# Patient Record
Sex: Female | Born: 1960 | Race: White | Hispanic: No | Marital: Married | Smoking: Never smoker
Health system: Southern US, Community
[De-identification: ages and names within clinical notes are randomized; demographics above are authoritative.]

## PROBLEM LIST (undated history)

## (undated) DIAGNOSIS — F32A Depression, unspecified: Secondary | ICD-10-CM

## (undated) DIAGNOSIS — I499 Cardiac arrhythmia, unspecified: Secondary | ICD-10-CM

## (undated) DIAGNOSIS — K5090 Crohn's disease, unspecified, without complications: Secondary | ICD-10-CM

## (undated) DIAGNOSIS — F329 Major depressive disorder, single episode, unspecified: Secondary | ICD-10-CM

## (undated) DIAGNOSIS — K56609 Unspecified intestinal obstruction, unspecified as to partial versus complete obstruction: Secondary | ICD-10-CM

## (undated) DIAGNOSIS — K259 Gastric ulcer, unspecified as acute or chronic, without hemorrhage or perforation: Secondary | ICD-10-CM

## (undated) DIAGNOSIS — T4145XA Adverse effect of unspecified anesthetic, initial encounter: Secondary | ICD-10-CM

## (undated) DIAGNOSIS — R112 Nausea with vomiting, unspecified: Secondary | ICD-10-CM

## (undated) DIAGNOSIS — Z9889 Other specified postprocedural states: Secondary | ICD-10-CM

## (undated) DIAGNOSIS — K922 Gastrointestinal hemorrhage, unspecified: Secondary | ICD-10-CM

## (undated) DIAGNOSIS — K449 Diaphragmatic hernia without obstruction or gangrene: Secondary | ICD-10-CM

## (undated) DIAGNOSIS — N6310 Unspecified lump in the right breast, unspecified quadrant: Secondary | ICD-10-CM

## (undated) DIAGNOSIS — D509 Iron deficiency anemia, unspecified: Secondary | ICD-10-CM

## (undated) DIAGNOSIS — F419 Anxiety disorder, unspecified: Secondary | ICD-10-CM

## (undated) DIAGNOSIS — T8859XA Other complications of anesthesia, initial encounter: Secondary | ICD-10-CM

## (undated) HISTORY — DX: Unspecified intestinal obstruction, unspecified as to partial versus complete obstruction: K56.609

## (undated) HISTORY — PX: TONSILLECTOMY: SUR1361

## (undated) HISTORY — DX: Crohn's disease, unspecified, without complications: K50.90

## (undated) HISTORY — DX: Diaphragmatic hernia without obstruction or gangrene: K44.9

## (undated) HISTORY — PX: APPENDECTOMY: SHX54

## (undated) HISTORY — DX: Gastrointestinal hemorrhage, unspecified: K92.2

## (undated) HISTORY — DX: Iron deficiency anemia, unspecified: D50.9

## (undated) HISTORY — PX: BREAST SURGERY: SHX581

## (undated) HISTORY — PX: HERNIA REPAIR: SHX51

---
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
## 2000-02-15 ENCOUNTER — Other Ambulatory Visit: Admission: RE | Admit: 2000-02-15 | Discharge: 2000-02-15 | Payer: Self-pay | Admitting: Gynecology

## 2000-04-03 ENCOUNTER — Encounter: Admission: RE | Admit: 2000-04-03 | Discharge: 2000-04-03 | Payer: Self-pay | Admitting: Gynecology

## 2000-04-03 ENCOUNTER — Encounter: Payer: Self-pay | Admitting: Gynecology

## 2000-08-10 ENCOUNTER — Encounter (INDEPENDENT_AMBULATORY_CARE_PROVIDER_SITE_OTHER): Payer: Self-pay

## 2000-08-10 ENCOUNTER — Other Ambulatory Visit: Admission: RE | Admit: 2000-08-10 | Discharge: 2000-08-10 | Payer: Self-pay | Admitting: Plastic Surgery

## 2000-10-27 ENCOUNTER — Encounter (INDEPENDENT_AMBULATORY_CARE_PROVIDER_SITE_OTHER): Payer: Self-pay | Admitting: *Deleted

## 2000-10-27 ENCOUNTER — Other Ambulatory Visit: Admission: RE | Admit: 2000-10-27 | Discharge: 2000-10-27 | Payer: Self-pay | Admitting: Gynecology

## 2000-10-27 ENCOUNTER — Encounter: Payer: Self-pay | Admitting: Gynecology

## 2000-10-27 ENCOUNTER — Encounter: Admission: RE | Admit: 2000-10-27 | Discharge: 2000-10-27 | Payer: Self-pay | Admitting: Gynecology

## 2001-03-20 ENCOUNTER — Other Ambulatory Visit: Admission: RE | Admit: 2001-03-20 | Discharge: 2001-03-20 | Payer: Self-pay | Admitting: Gynecology

## 2001-10-29 ENCOUNTER — Encounter: Payer: Self-pay | Admitting: Gynecology

## 2001-10-29 ENCOUNTER — Encounter: Admission: RE | Admit: 2001-10-29 | Discharge: 2001-10-29 | Payer: Self-pay | Admitting: Gynecology

## 2001-12-24 ENCOUNTER — Encounter: Admission: RE | Admit: 2001-12-24 | Discharge: 2001-12-24 | Payer: Self-pay | Admitting: Internal Medicine

## 2001-12-24 ENCOUNTER — Encounter: Payer: Self-pay | Admitting: Internal Medicine

## 2002-04-02 ENCOUNTER — Other Ambulatory Visit: Admission: RE | Admit: 2002-04-02 | Discharge: 2002-04-02 | Payer: Self-pay | Admitting: Gynecology

## 2002-11-13 ENCOUNTER — Encounter: Payer: Self-pay | Admitting: Gynecology

## 2002-11-13 ENCOUNTER — Encounter: Admission: RE | Admit: 2002-11-13 | Discharge: 2002-11-13 | Payer: Self-pay | Admitting: Gynecology

## 2002-11-20 ENCOUNTER — Encounter (INDEPENDENT_AMBULATORY_CARE_PROVIDER_SITE_OTHER): Payer: Self-pay | Admitting: Specialist

## 2002-11-20 ENCOUNTER — Ambulatory Visit (HOSPITAL_COMMUNITY): Admission: RE | Admit: 2002-11-20 | Discharge: 2002-11-20 | Payer: Self-pay | Admitting: Gastroenterology

## 2003-04-09 ENCOUNTER — Other Ambulatory Visit: Admission: RE | Admit: 2003-04-09 | Discharge: 2003-04-09 | Payer: Self-pay | Admitting: Gynecology

## 2003-11-19 ENCOUNTER — Encounter: Admission: RE | Admit: 2003-11-19 | Discharge: 2003-11-19 | Payer: Self-pay | Admitting: Gynecology

## 2004-04-21 ENCOUNTER — Other Ambulatory Visit: Admission: RE | Admit: 2004-04-21 | Discharge: 2004-04-21 | Payer: Self-pay | Admitting: Gynecology

## 2004-12-08 ENCOUNTER — Encounter: Admission: RE | Admit: 2004-12-08 | Discharge: 2004-12-08 | Payer: Self-pay | Admitting: Gynecology

## 2005-02-10 ENCOUNTER — Ambulatory Visit: Payer: Self-pay | Admitting: Internal Medicine

## 2005-04-28 ENCOUNTER — Other Ambulatory Visit: Admission: RE | Admit: 2005-04-28 | Discharge: 2005-04-28 | Payer: Self-pay | Admitting: Gynecology

## 2005-12-30 ENCOUNTER — Encounter: Admission: RE | Admit: 2005-12-30 | Discharge: 2005-12-30 | Payer: Self-pay | Admitting: Gynecology

## 2006-04-25 ENCOUNTER — Ambulatory Visit: Payer: Self-pay | Admitting: Internal Medicine

## 2006-05-31 ENCOUNTER — Other Ambulatory Visit: Admission: RE | Admit: 2006-05-31 | Discharge: 2006-05-31 | Payer: Self-pay | Admitting: Gynecology

## 2006-06-25 ENCOUNTER — Encounter: Admission: RE | Admit: 2006-06-25 | Discharge: 2006-06-25 | Payer: Self-pay | Admitting: Family Medicine

## 2007-01-30 ENCOUNTER — Encounter: Admission: RE | Admit: 2007-01-30 | Discharge: 2007-01-30 | Payer: Self-pay | Admitting: Gynecology

## 2008-03-31 ENCOUNTER — Encounter: Admission: RE | Admit: 2008-03-31 | Discharge: 2008-03-31 | Payer: Self-pay | Admitting: Gynecology

## 2008-04-17 ENCOUNTER — Encounter: Admission: RE | Admit: 2008-04-17 | Discharge: 2008-04-17 | Payer: Self-pay | Admitting: Gynecology

## 2009-04-20 ENCOUNTER — Encounter: Admission: RE | Admit: 2009-04-20 | Discharge: 2009-04-20 | Payer: Self-pay | Admitting: Gynecology

## 2009-08-21 ENCOUNTER — Telehealth: Payer: Self-pay | Admitting: Internal Medicine

## 2009-10-06 DIAGNOSIS — Z8679 Personal history of other diseases of the circulatory system: Secondary | ICD-10-CM | POA: Insufficient documentation

## 2009-10-06 DIAGNOSIS — F411 Generalized anxiety disorder: Secondary | ICD-10-CM | POA: Insufficient documentation

## 2009-10-06 DIAGNOSIS — R0602 Shortness of breath: Secondary | ICD-10-CM | POA: Insufficient documentation

## 2009-10-07 ENCOUNTER — Ambulatory Visit: Payer: Self-pay | Admitting: Internal Medicine

## 2010-01-14 ENCOUNTER — Ambulatory Visit (HOSPITAL_COMMUNITY): Admission: RE | Admit: 2010-01-14 | Discharge: 2010-01-14 | Payer: Self-pay | Admitting: Gastroenterology

## 2010-08-03 ENCOUNTER — Encounter: Admission: RE | Admit: 2010-08-03 | Discharge: 2010-08-03 | Payer: Self-pay | Admitting: Gynecology

## 2011-01-02 ENCOUNTER — Encounter: Payer: Self-pay | Admitting: Gynecology

## 2011-04-29 NOTE — Op Note (Signed)
NAME:  Carolyn Chaney, Carolyn Chaney                          ACCOUNT NO.:  [id number]   MEDICAL RECORD NO.:  [id number]                   PATIENT TYPE:  AMB   LOCATION:  ENDO                                 FACILITY:  [facility]   PHYSICIAN:  Earle Gell, M.D.                DATE OF BIRTH:  04/15/1961   DATE OF PROCEDURE:  11/20/2002  DATE OF DISCHARGE:                                 OPERATIVE REPORT   PROCEDURE:  Screening colonoscopy.   ENDOSCOPIST:  Mickeal Skinner, M.D.   REFERRING PHYSICIAN:  Berneta Sages, M.D.   INDICATIONS FOR PROCEDURE:  The patient is a 49 year old female born January 16, 1961.  She is undergoing diagnostic colonoscopy to evaluate guaiac-  positive stool.  Her hemoglobin is 11.2 g.  Her serum ferritin and iron  saturation are normal.   PREMEDICATION:  Versed 12 mg, Demerol 75 mg.   ENDOSCOPE:  Olympus pediatric colonoscope.   DESCRIPTION OF PROCEDURE:  After obtaining the informed consent, the patient  was placed in the left lateral decubitus position.  I administered  intravenous Demerol and intravenous Versed to achieve conscious sedation  before the  procedure.  The patient's blood pressure, oxygen saturation, and cardiac  rhythm were monitored throughout the procedure and documented in the medical  record.   Anal inspection was normal.  Digital rectal exam was normal.  The Olympus  pediatric video colonoscope was introduced into the rectum and advanced to  the cecum.  The ileocecal valve oris was identified but was unable to  intubate the distal ileum.  Colonic preparation for the exam today was  excellent.   Rectum normal.   Sigmoid colon and descending colon:  Normal.   Splenic flexure normal.   Transverse colon normal.   Hepatic flexure normal.   Ascending colon normal.   Cecum and ileocecal valve:  There are a few aphthous type erosions in the  cecum.  The cecal mucosa is not friable.  Peering into the distal ileum  through the ileocecal  valve orifice revealed normal distal ileal mucosa.  Biopsies were taken from the cecum.  The patient does take nonsteroidal anti-  inflammatory medication on a daily basis to treat migraine headaches and  this may reflect NSAID injury to the cecal mucosa.   ASSESSMENT:  Normal proctocolonoscopy to the cecum except for a few  scattered erosions in the cecum which were biopsied and may simply reflect  nonsteroidal anti-inflammatory drug injury to the cecal mucosa.  There are  no definite signs for the presence of inflammatory bowel disease, and I  identify no colorectal neoplasia.                                                Earle Gell, M.D.  MJ/MEDQ  D:  11/20/2002  T:  11/20/2002  Job:  [id number]   cc:   Berneta Sages, M.D.  [address]  [place]  [place] [address]  Fax: [phone]

## 2011-07-20 ENCOUNTER — Other Ambulatory Visit: Payer: Self-pay | Admitting: Gynecology

## 2011-07-20 DIAGNOSIS — N6009 Solitary cyst of unspecified breast: Secondary | ICD-10-CM

## 2011-08-16 ENCOUNTER — Other Ambulatory Visit: Payer: Self-pay

## 2011-08-25 ENCOUNTER — Other Ambulatory Visit: Payer: Self-pay

## 2011-09-01 ENCOUNTER — Ambulatory Visit
Admission: RE | Admit: 2011-09-01 | Discharge: 2011-09-01 | Disposition: A | Discharge status: Home / Self Care | Payer: BC Managed Care – PPO | Source: Ambulatory Visit | Attending: Gynecology | Admitting: Gynecology

## 2011-09-01 DIAGNOSIS — N6009 Solitary cyst of unspecified breast: Secondary | ICD-10-CM

## 2011-09-16 ENCOUNTER — Other Ambulatory Visit: Payer: Self-pay | Admitting: Hematology & Oncology

## 2011-09-16 ENCOUNTER — Ambulatory Visit (HOSPITAL_BASED_OUTPATIENT_CLINIC_OR_DEPARTMENT_OTHER): Payer: BC Managed Care – PPO | Admitting: Hematology & Oncology

## 2011-09-16 ENCOUNTER — Other Ambulatory Visit: Payer: Self-pay | Admitting: Family

## 2011-09-16 DIAGNOSIS — D649 Anemia, unspecified: Secondary | ICD-10-CM

## 2011-09-16 DIAGNOSIS — D509 Iron deficiency anemia, unspecified: Secondary | ICD-10-CM

## 2011-09-16 LAB — CBC WITH DIFFERENTIAL (CANCER CENTER ONLY)
BASO#: 0.1 10*3/uL (ref 0.0–0.2)
BASO%: 0.9 % (ref 0.0–2.0)
EOS%: 1.8 % (ref 0.0–7.0)
HCT: 24.2 % — ABNORMAL LOW (ref 34.8–46.6)
HGB: 7.2 g/dL — ABNORMAL LOW (ref 11.6–15.9)
LYMPH#: 1.2 10*3/uL (ref 0.9–3.3)
LYMPH%: 22.3 % (ref 14.0–48.0)
MCH: 23.7 pg — ABNORMAL LOW (ref 26.0–34.0)
MCHC: 29.8 g/dL — ABNORMAL LOW (ref 32.0–36.0)
MCV: 80 fL — ABNORMAL LOW (ref 81–101)
MONO%: 6.2 % (ref 0.0–13.0)
NEUT#: 3.8 10*3/uL (ref 1.5–6.5)
Platelets: 291 10*3/uL (ref 145–400)
RBC: 3.04 10*6/uL — ABNORMAL LOW (ref 3.70–5.32)
RDW: 16.4 % — ABNORMAL HIGH (ref 11.1–15.7)

## 2011-09-16 LAB — BASIC METABOLIC PANEL
CO2: 22 mEq/L (ref 19–32)
Chloride: 103 mEq/L (ref 96–112)
Creatinine, Ser: 0.83 mg/dL (ref 0.50–1.10)
Potassium: 4.7 mEq/L (ref 3.5–5.3)
Sodium: 138 mEq/L (ref 135–145)

## 2011-09-16 LAB — HOLD TUBE, BLOOD BANK - CHCC SATELLITE

## 2011-09-16 LAB — CHCC SATELLITE - SMEAR

## 2011-09-16 LAB — FERRITIN: Ferritin: 6 ng/mL — ABNORMAL LOW (ref 10–291)

## 2011-09-19 LAB — RETICULOCYTES (CHCC)
ABS Retic: 104.6 10*3/uL (ref 19.0–186.0)
RBC.: 3.17 MIL/uL — ABNORMAL LOW (ref 3.87–5.11)

## 2011-09-19 LAB — LACTATE DEHYDROGENASE: LDH: 78 U/L — ABNORMAL LOW (ref 94–250)

## 2011-09-19 LAB — IRON AND TIBC
TIBC: 425 ug/dL (ref 250–470)
UIBC: 290 ug/dL (ref 125–400)

## 2011-09-19 LAB — VITAMIN D 25 HYDROXY (VIT D DEFICIENCY, FRACTURES): Vit D, 25-Hydroxy: 47 ng/mL (ref 30–89)

## 2011-09-22 ENCOUNTER — Other Ambulatory Visit: Payer: Self-pay | Admitting: Gastroenterology

## 2011-09-22 DIAGNOSIS — K922 Gastrointestinal hemorrhage, unspecified: Secondary | ICD-10-CM

## 2011-09-23 ENCOUNTER — Ambulatory Visit
Admission: RE | Admit: 2011-09-23 | Discharge: 2011-09-23 | Disposition: A | Discharge status: Home / Self Care | Payer: BC Managed Care – PPO | Source: Ambulatory Visit | Attending: Gastroenterology | Admitting: Gastroenterology

## 2011-09-23 DIAGNOSIS — K922 Gastrointestinal hemorrhage, unspecified: Secondary | ICD-10-CM

## 2011-09-23 MED ORDER — IOHEXOL 300 MG/ML  SOLN
100.0000 mL | Freq: Once | INTRAMUSCULAR | Status: AC | PRN
  Administered 2011-09-23: 100 mL via INTRAVENOUS

## 2011-10-04 ENCOUNTER — Encounter: Payer: Self-pay | Admitting: *Deleted

## 2011-10-27 ENCOUNTER — Ambulatory Visit (HOSPITAL_BASED_OUTPATIENT_CLINIC_OR_DEPARTMENT_OTHER): Payer: BC Managed Care – PPO | Admitting: Hematology & Oncology

## 2011-10-27 ENCOUNTER — Other Ambulatory Visit: Payer: Self-pay | Admitting: Hematology & Oncology

## 2011-10-27 ENCOUNTER — Encounter: Payer: Self-pay | Admitting: *Deleted

## 2011-10-27 ENCOUNTER — Other Ambulatory Visit (HOSPITAL_BASED_OUTPATIENT_CLINIC_OR_DEPARTMENT_OTHER): Payer: BC Managed Care – PPO | Admitting: Lab

## 2011-10-27 VITALS — BP 107/60 | HR 63 | Temp 97.5°F | Ht 66.0 in | Wt 141.0 lb

## 2011-10-27 DIAGNOSIS — K259 Gastric ulcer, unspecified as acute or chronic, without hemorrhage or perforation: Secondary | ICD-10-CM

## 2011-10-27 DIAGNOSIS — D500 Iron deficiency anemia secondary to blood loss (chronic): Secondary | ICD-10-CM

## 2011-10-27 LAB — IRON AND TIBC
Iron: 89 ug/dL (ref 42–145)
TIBC: 282 ug/dL (ref 250–470)
TIBC: 282 ug/dL (ref 250–470)
UIBC: 193 ug/dL (ref 125–400)
UIBC: 193 ug/dL (ref 125–400)

## 2011-10-27 LAB — RETICULOCYTES (CHCC)
ABS Retic: 25.3 10*3/uL (ref 19.0–186.0)
RBC.: 4.22 MIL/uL (ref 3.87–5.11)
RBC.: 4.22 MIL/uL (ref 3.87–5.11)
RBC.: 4.22 MIL/uL (ref 3.87–5.11)
Retic Ct Pct: 0.6 % (ref 0.4–2.3)
Retic Ct Pct: 0.6 % (ref 0.4–2.3)
Retic Ct Pct: 0.6 % (ref 0.4–2.3)

## 2011-10-27 LAB — CBC WITH DIFFERENTIAL (CANCER CENTER ONLY)
BASO#: 0 10*3/uL (ref 0.0–0.2)
HCT: 37 % (ref 34.8–46.6)
HGB: 12.3 g/dL (ref 11.6–15.9)
LYMPH#: 1.2 10*3/uL (ref 0.9–3.3)
MCV: 88 fL (ref 81–101)
MONO#: 0.3 10*3/uL (ref 0.1–0.9)
NEUT%: 67.8 % (ref 39.6–80.0)
WBC: 5.1 10*3/uL (ref 3.9–10.0)

## 2011-10-27 NOTE — Progress Notes (Signed)
CSN: [id number] This office note has been dictated. ENNEVER,PETER R

## 2011-10-27 NOTE — Progress Notes (Signed)
CC:   Carolyn Chaney. Carolyn Chaney, M.D. Carolyn Chaney, M.D. Carolyn Chaney, M.D.  DIAGNOSES: 1. Iron deficiency anemia secondary to GI bleeding. 2. Intestinal gastric ulcers.  CURRENT THERAPY:  The patient is status post IV iron with Feraheme.  INTERIM HISTORY:  Carolyn Chaney comes in for her 2nd visit.  She is feeling a whole lot better.  When we saw her back in October, her ferritin was only 6.  We gave her dose of Feraheme at 1020 mg.  She began to feel better.  She has not noted any melena or bright red blood per rectum.  She does have some abdominal discomfort.  She was wondering if she needed to have another GI evaluation.  She has seen Dr. Earle Chaney before.  I told her that I am sure Dr. Wynetta Emery would be more than happy to see her.  She is having some back discomfort.  Again, she is not taking nonsteroidals now.  She was taking a lot of Goody's headache powder.  I told her that she could probably try some Ultram if she wished.  She has a very strong history of heart disease in the family.  She is off aspirin.  I told her that she could probably take a baby coated aspirin which should be okay.  PHYSICAL EXAMINATION:  General:  On her physical exam, this is a well- developed, well-nourished, white female in no obvious distress. Vital signs: Show a temperature of 97.5, pulse 63, respiratory rate 20, blood pressure 107/90, and weight is 141.  Head and neck exam shows normocephalic and atraumatic skull.  There are no ocular or oral lesions.  There are no palpable cervical or supraclavicular lymph nodes. Lungs are Clear to percussion and auscultation bilaterally.  Cardiac exam: Regular rate and rhythm with a normal S1 and S2. No gallops, murmurs, rubs, or bruits.  Abdominal exam: Soft with good bowel sounds. There is no palpable abdominal mass.  There is no palpable hepatosplenomegaly.  Back exam:  There is no over the spine, ribs, or hips.  Extremities show No clubbing, cyanosis,  or edema.  Neurological exam shows no focal neurological deficit.  LABORATORY STUDIES:  White cell count 12.1, hemoglobin 12.3, hematocrit 37, and  platelet count 283. MCV is 88.  IMPRESSION:  Carolyn Chaney is a 50 year old white female with iron deficiency anemia.  This is improved quite nicely.  We will check her iron stores.  We will what her ferritin is.  I really want to try to get her ferritin above 100 so that she can maintain her iron levels.  We will go ahead and plan to get her back next year.  I think that that would be appropriate for [place].  November 14, 2012Again, we will follow her iron studies.    ______________________________ [name], M.D. PRE/MEDQ  D:  10/27/2011  T:  10/27/2011  Job:  [id number]  ADDENDUM:  Ferritin is 43.  Needs 1 more dose of FeraHeme.

## 2011-10-27 NOTE — Progress Notes (Signed)
Sent schedule to pt for january

## 2011-11-07 ENCOUNTER — Ambulatory Visit (HOSPITAL_BASED_OUTPATIENT_CLINIC_OR_DEPARTMENT_OTHER): Payer: BC Managed Care – PPO

## 2011-11-07 ENCOUNTER — Encounter: Payer: Self-pay | Admitting: Family

## 2011-11-07 ENCOUNTER — Other Ambulatory Visit: Payer: Self-pay | Admitting: Family

## 2011-11-07 VITALS — BP 105/70 | HR 57 | Temp 97.2°F

## 2011-11-07 DIAGNOSIS — D509 Iron deficiency anemia, unspecified: Secondary | ICD-10-CM

## 2011-11-07 DIAGNOSIS — D500 Iron deficiency anemia secondary to blood loss (chronic): Secondary | ICD-10-CM

## 2011-11-07 DIAGNOSIS — K259 Gastric ulcer, unspecified as acute or chronic, without hemorrhage or perforation: Secondary | ICD-10-CM

## 2011-11-07 HISTORY — DX: Iron deficiency anemia, unspecified: D50.9

## 2011-11-07 MED ORDER — SODIUM CHLORIDE 0.9 % IV SOLN
Freq: Once | INTRAVENOUS | Status: AC
  Administered 2011-11-07: 15:00:00 via INTRAVENOUS

## 2011-11-07 MED ORDER — SODIUM CHLORIDE 0.9 % IV SOLN
1020.0000 mg | Freq: Once | INTRAVENOUS | Status: AC
  Administered 2011-11-07: 1020 mg via INTRAVENOUS
  Filled 2011-11-07: qty 34

## 2011-12-15 ENCOUNTER — Other Ambulatory Visit: Payer: Self-pay | Admitting: Gynecology

## 2012-01-02 ENCOUNTER — Other Ambulatory Visit: Payer: BC Managed Care – PPO | Admitting: Lab

## 2012-01-02 ENCOUNTER — Telehealth: Payer: Self-pay | Admitting: Hematology & Oncology

## 2012-01-02 ENCOUNTER — Ambulatory Visit: Payer: BC Managed Care – PPO | Admitting: Hematology & Oncology

## 2012-01-02 NOTE — Telephone Encounter (Signed)
Pt called moved 1-21 to 2-20 said she had cancelled , but we don't have record of it.

## 2012-02-01 ENCOUNTER — Other Ambulatory Visit (HOSPITAL_BASED_OUTPATIENT_CLINIC_OR_DEPARTMENT_OTHER): Payer: BC Managed Care – PPO | Admitting: Lab

## 2012-02-01 ENCOUNTER — Telehealth: Payer: Self-pay | Admitting: Hematology & Oncology

## 2012-02-01 ENCOUNTER — Ambulatory Visit (HOSPITAL_BASED_OUTPATIENT_CLINIC_OR_DEPARTMENT_OTHER): Payer: BC Managed Care – PPO | Admitting: Hematology & Oncology

## 2012-02-01 VITALS — BP 106/70 | HR 67 | Temp 97.0°F | Ht 66.0 in | Wt 141.0 lb

## 2012-02-01 DIAGNOSIS — M5431 Sciatica, right side: Secondary | ICD-10-CM

## 2012-02-01 DIAGNOSIS — M549 Dorsalgia, unspecified: Secondary | ICD-10-CM

## 2012-02-01 DIAGNOSIS — D509 Iron deficiency anemia, unspecified: Secondary | ICD-10-CM

## 2012-02-01 DIAGNOSIS — R109 Unspecified abdominal pain: Secondary | ICD-10-CM

## 2012-02-01 DIAGNOSIS — D500 Iron deficiency anemia secondary to blood loss (chronic): Secondary | ICD-10-CM

## 2012-02-01 LAB — CBC WITH DIFFERENTIAL (CANCER CENTER ONLY)
BASO%: 0.5 % (ref 0.0–2.0)
EOS%: 2.2 % (ref 0.0–7.0)
LYMPH%: 23.6 % (ref 14.0–48.0)
MCH: 31.6 pg (ref 26.0–34.0)
MCV: 94 fL (ref 81–101)
MONO#: 0.3 10*3/uL (ref 0.1–0.9)
MONO%: 5.1 % (ref 0.0–13.0)
Platelets: 227 10*3/uL (ref 145–400)
RDW: 12.4 % (ref 11.1–15.7)
WBC: 5.5 10*3/uL (ref 3.9–10.0)

## 2012-02-01 LAB — CHCC SATELLITE - SMEAR

## 2012-02-01 NOTE — Telephone Encounter (Signed)
Mailed 04-18-12 schedule

## 2012-02-01 NOTE — Progress Notes (Signed)
This office note has been dictated.

## 2012-02-01 NOTE — Progress Notes (Signed)
Addended by: Burney Gauze R on: 02/01/2012 01:10 PM   Modules accepted: Orders

## 2012-02-02 NOTE — Progress Notes (Signed)
CC:   Carolyn Chaney. [name], M.D. Earle Gell, M.D. Selinda Orion, M.D.  DIAGNOSIS:  Iron-deficiency anemia secondary to gastrointestinal bleed.  CURRENT THERAPY:  IV iron as indicated.  INTERIM HISTORY:  Carolyn Chaney comes in for her followup.  She has responded very well to IV iron.  She got Feraheme back in November.  She got a dose of 1020 mg.  This helped her hemoglobin quite a bit.  When we last saw her in November, her ferritin was 45.  When we initially saw her, her ferritin was less than 6.  I think she is having abdominal pain.  She learned that she may need to be checked for helicobacter.  I told that Dr. Wynetta Emery certainly could check her for this.  Her problem right now is her back.  She is having a lot of back issues. She is having radiating pain down her right leg.  I gave her the name of a spinal surgeon that she could certainly see to try to help her out.  Carolyn Chaney is one that really does not like going to doctors.  She does not like having tests done.  She also does not like taking medications.  She has had no change in bowel or bladder habits.  There has been no nausea or vomiting.  She has had the abdominal pain which it is cramp- like.  PHYSICAL EXAMINATION:  General:  This is a well-developed, well- nourished white female in no obvious distress.  Vital Signs:  97, pulse 67, respiratory rate 16, blood pressure 106/70.  Weight is 141.  Head and Neck Exam:  Shows a normocephalic, atraumatic skull.  There are no ocular or oral lesions.  There are no palpable cervical or supraclavicular lymph nodes.  Lungs:  Clear bilaterally.  Cardiac Exam: Regular rate and rhythm with normal S1 and S2.  There are no murmurs, rubs or bruits.  Abdominal Exam:  Soft with good bowel sounds.  There is no palpable abdominal mass.  There is no fluid wave.  There is no palpable hepatosplenomegaly.  Back Exam:  No tenderness over the spine, ribs or hips.  There are some slight spasms  in the right lumbar paravertebral musculature.  Extremities:  Show no clubbing, cyanosis or edema.  She has a questionable straight leg raise test on the right. Skin Exam:  No rashes, ecchymosis or petechia.  LABORATORY STUDIES:  White cell count 5.5, hemoglobin 12.3, hematocrit 36.5, platelet count 227.  MCV is 94.  IMPRESSION:  Carolyn Chaney is a very nice 51 year old white female with history of iron-deficiency anemia.  She was found to have GI bleeding from gastric and intestinal ulcers.  This is likely from NSAID use.  She got iron.  She is doing well with the iron.  We will see what her ferritin is.  Even though she is not anemic, it is possible that her iron may be on the low side, and if so, we can certainly get her in for another dose of iron.  Hopefully, her back can be sorted out.  I just feel bad that she is suffering the way she is.  I will see her back myself in another couple months so we can maintain followup with her anemia.    ______________________________ [name], M.D. PRE/MEDQ  D:  02/01/2012  T:  02/02/2012  Job:  [id number]

## 2012-02-07 ENCOUNTER — Encounter: Payer: Self-pay | Admitting: *Deleted

## 2012-02-07 NOTE — Progress Notes (Signed)
Spoke to pt regarding labs from 02/01/12. "Iron is great!!!" per Dr Marin Olp.

## 2012-04-17 ENCOUNTER — Telehealth: Payer: Self-pay | Admitting: Hematology & Oncology

## 2012-04-17 NOTE — Telephone Encounter (Signed)
Pt called and cx 04/18/12 apt and resch for 05/14/12

## 2012-04-18 ENCOUNTER — Ambulatory Visit: Payer: BC Managed Care – PPO | Admitting: Hematology & Oncology

## 2012-04-18 ENCOUNTER — Other Ambulatory Visit: Payer: BC Managed Care – PPO | Admitting: Lab

## 2012-05-14 ENCOUNTER — Ambulatory Visit: Payer: BC Managed Care – PPO | Admitting: Hematology & Oncology

## 2012-05-14 ENCOUNTER — Other Ambulatory Visit: Payer: BC Managed Care – PPO | Admitting: Lab

## 2012-05-14 ENCOUNTER — Telehealth: Payer: Self-pay | Admitting: Hematology & Oncology

## 2012-05-14 NOTE — Telephone Encounter (Signed)
Due to MD calling is sick, pt was called and apt was cx and resch for 05/30/12

## 2012-05-30 ENCOUNTER — Other Ambulatory Visit (HOSPITAL_BASED_OUTPATIENT_CLINIC_OR_DEPARTMENT_OTHER): Payer: BC Managed Care – PPO | Admitting: Lab

## 2012-05-30 ENCOUNTER — Ambulatory Visit (HOSPITAL_BASED_OUTPATIENT_CLINIC_OR_DEPARTMENT_OTHER): Payer: BC Managed Care – PPO | Admitting: Hematology & Oncology

## 2012-05-30 VITALS — BP 98/67 | HR 75 | Temp 97.6°F | Ht 66.0 in | Wt 137.0 lb

## 2012-05-30 DIAGNOSIS — D509 Iron deficiency anemia, unspecified: Secondary | ICD-10-CM

## 2012-05-30 LAB — CBC WITH DIFFERENTIAL (CANCER CENTER ONLY)
BASO%: 0.2 % (ref 0.0–2.0)
EOS%: 0 % (ref 0.0–7.0)
LYMPH#: 0.7 10*3/uL — ABNORMAL LOW (ref 0.9–3.3)
LYMPH%: 8.9 % — ABNORMAL LOW (ref 14.0–48.0)
MONO#: 0.3 10*3/uL (ref 0.1–0.9)
Platelets: 253 10*3/uL (ref 145–400)
RDW: 11.6 % (ref 11.1–15.7)
WBC: 8.3 10*3/uL (ref 3.9–10.0)

## 2012-05-30 LAB — CHCC SATELLITE - SMEAR

## 2012-05-30 LAB — RETICULOCYTES (CHCC): ABS Retic: 29.1 10*3/uL (ref 19.0–186.0)

## 2012-05-30 LAB — FERRITIN: Ferritin: 149 ng/mL (ref 10–291)

## 2012-05-30 NOTE — Progress Notes (Signed)
This office note has been dictated.

## 2012-05-31 NOTE — Progress Notes (Signed)
CC:   Carolyn Chaney. Carolyn Chaney, M.D. Carolyn Chaney, M.D. Carolyn Bob, MD Carolyn Chaney, M.D.  DIAGNOSIS:  Iron-deficiency anemia.  CURRENT THERAPY:  IV iron as indicated.  Patient last received Feraheme back on 11/07/2011.  INTERIM HISTORY:  Carolyn Chaney comes in for her followup.  She is doing okay herself.  She was found have some bursitis in her hips and sacroiliitis.  She has been seeing Dr.  Lynann Bologna over at [facility].  He is trying to help her out.  She is doing a little physical therapy right now.  When we last saw her back in February, her ferritin was 234 with an iron saturation of 64%.  Unfortunately, her younger sister has anal cancer.  She is going to be treated out at [facility].  I talked to Carolyn Chaney about this. I told her that her sister is getting outstanding care and that they are being very aggressive and using the standard therapy for anal cancer.  She is on the tan side.  She went to the beach last week.  She did have a nice time.  Her appetite has been okay.  She has had no nausea or vomiting.  There has been no cough.  She has had no leg swelling.  She has had no change in bowel or bladder habits.  She has not noticed any melena or bright red blood per rectum.  PHYSICAL EXAMINATION:  This is a well-developed, well-nourished white female in no obvious distress.  Vital signs:  Temperature of 97.3, pulse 75, respiratory rate 18, blood pressure 98/67.  Weight is 137.  Head and neck:  Normocephalic, atraumatic skull.  There are no ocular or oral lesions.  There are no palpable cervical or supraclavicular lymph nodes. Lungs:  Clear bilaterally.  Cardiac:  Regular rate and rhythm with a normal S1 and S2.  There are no murmurs, rubs or bruits.  Abdomen:  Soft with good bowel sounds.  There is no palpable abdominal mass.  There is no fluid wave.  No palpable hepatosplenomegaly.  Back:  No tenderness over the spine, ribs, or hips.  Extremities:   \no clubbing, cyanosis or edema.  LABORATORY STUDIES:  White cell count is 8.3, hemoglobin 13.2, hematocrit 39.1, platelet count 253.  MCV is 95.  IMPRESSION:  Carolyn Chaney is a very charming 51 year old white female with history of iron-deficiency anemia.  So far, this has corrected itself. We will see what her iron studies are today.  I will plan to get her back to see [place] in another 4 months.  I think this would be a good amount of followup.    ______________________________ [name], M.D. PRE/MEDQ  D:  05/30/2012  T:  05/31/2012  Job:  [id number]

## 2012-06-11 ENCOUNTER — Telehealth: Payer: Self-pay | Admitting: *Deleted

## 2012-06-11 NOTE — Telephone Encounter (Signed)
Called patient and left personal voice mail to let her know that her iron levels were ok per dr. Marin Olp

## 2012-06-11 NOTE — Telephone Encounter (Signed)
Message copied by Rico Ala on Mon Jun 11, 2012 11:53 AM ------      Message from: [name]      Created: Sun Jun 10, 2012  7:50 PM       Call - iron is ok. pete

## 2012-06-28 ENCOUNTER — Telehealth: Payer: Self-pay | Admitting: *Deleted

## 2012-06-28 NOTE — Telephone Encounter (Signed)
Received call from patient stating that when she had MRI of lower back they questioned whether or not there were problems with her bone marrow based on this. And she should call Dr. Marin Olp.  Dr. Marin Olp notified. Will get the report and follow up.

## 2012-07-26 ENCOUNTER — Other Ambulatory Visit: Payer: Self-pay | Admitting: Gynecology

## 2012-07-26 DIAGNOSIS — Z1231 Encounter for screening mammogram for malignant neoplasm of breast: Secondary | ICD-10-CM

## 2012-08-08 ENCOUNTER — Telehealth: Payer: Self-pay | Admitting: Internal Medicine

## 2012-08-08 NOTE — Telephone Encounter (Signed)
I spoke with the patient. She states she has had trouble with palpitations for years. Yesterday, she noticed a period of several minutes that felt like her heart was quivering. She states she did become dizzy/ lightheaded. She explained this is different than any palpitations she has had. I advised that Dr. Caryl Comes has had a cancellation for tomorrow and offered this visit for her at 9:45 am. She is agreeable with this appointment.

## 2012-08-08 NOTE — Telephone Encounter (Signed)
Please return call pt at hm# regarding moving first available 10/10 appnt up asap for irregular heart beat.  Pt said she would like a return call from doctor as she feels like he would work her in.  Pt declined appnt w/PA

## 2012-08-09 ENCOUNTER — Ambulatory Visit (INDEPENDENT_AMBULATORY_CARE_PROVIDER_SITE_OTHER): Payer: BC Managed Care – PPO | Admitting: Internal Medicine

## 2012-08-09 ENCOUNTER — Encounter: Payer: Self-pay | Admitting: Internal Medicine

## 2012-08-09 VITALS — BP 122/77 | HR 72 | Ht 65.2 in | Wt 132.1 lb

## 2012-08-09 DIAGNOSIS — F411 Generalized anxiety disorder: Secondary | ICD-10-CM

## 2012-08-09 DIAGNOSIS — I498 Other specified cardiac arrhythmias: Secondary | ICD-10-CM

## 2012-08-09 DIAGNOSIS — Z8249 Family history of ischemic heart disease and other diseases of the circulatory system: Secondary | ICD-10-CM | POA: Insufficient documentation

## 2012-08-09 DIAGNOSIS — I471 Supraventricular tachycardia, unspecified: Secondary | ICD-10-CM | POA: Insufficient documentation

## 2012-08-09 NOTE — Assessment & Plan Note (Signed)
She has very strong family history of coronary disease. Electrocardiogram was normal per echo in the past has been normal. I will discuss with Dr. DM the role of cardiac CT

## 2012-08-09 NOTE — Assessment & Plan Note (Signed)
She is long-standing palpitations. Previously they had been identified as PACs and atrial tachycardia. These were distinct palpitation syndrome what happened more recently. That episode terminated with Valsalva; Dr. Jimmy Footman  was in attendance; I have not yet spoken with her about it. I suspect that this is probably SVTvery rapid rate.  We discussed treatment strategies. She has had problems with bradycardia and her other cardiologist in [facility] has been trying to wean her off of these medications and she been doing that this episode occurred on the tennis court. I've encouraged her to resume her metoprolol.  She would also like to consider catheter ablation. I will discuss that with Dr. Elliot Cousin.  Not withstanding absence of strips  , I think it is probably still reasonable to consider catheter ablation based on Dr.Deterdings clinical assessment and response to Valsalva. The patient would like to pursue it because she thinks to that of a major impact on her disabling anxiety by having this problem gone.

## 2012-08-09 NOTE — Assessment & Plan Note (Signed)
This is currently disabling. There is a great deal of ongoing psychosocial stress. She is interested in may of counseling and we'll find her contacts. I've encouraged her to follow up with Dr. Concha Pyo for augmented medications for this. I will be in touch with his office as her appointment that she recently tried to make was put off for 6 weeks  We discussed other strategies for dealing with the anxiety

## 2012-08-09 NOTE — Progress Notes (Signed)
History and Physical  Patient ID: Carolyn Chaney MRN: [id number], SOB: [id number] y.o. Date of Encounter: 08/09/2012, 10:49 AM  Primary Physician: Horatio Pel, MD Primary Cardiologist:   Primary Electrophysiologist:  sk  Chief Complaint:   History of Present Illness: Carolyn Chaney is a 51 y.o. female returns to the office after hiatus of 3 years because of ongoing problems with symptomatic palpitations  She had been identified as atrial tachycardia and PACs. She takes low-dose atenolol nightly for this; somebody   switched her to metoprolol.  The other day, while playing tennis, she had the abrupt onset of a different type of tachycardia syndrome. It was regular rapid and associated with lightheadedness and shortness of breath. He responded to Valsalva. While it was not recorded, Dr. Kathlee Nations Deterding there and helped manage and was able to terminate the tachycardia with Valsalva.   She is currently quite debilitated by anxiety related to the above as well as   significant psychosocial stress related to caring for her father following the recent death of her mother and taking care of her sister who has financial needs as well as an ongoing battle with anal cancer. She describes herself as lying on the couch been unable to mobilize herself, to get the next thing done. She is tearful as she describes the amount of stress that she is under.  There is also a strong family history of coronary disease which is also very anxiety provoking. She has been seeing a cardiologist in [facility].   Past Medical History  Diagnosis Date  . Iron deficiency anemia 11/07/2011     No past surgical history on file.    Current Outpatient Prescriptions  Medication Sig Dispense Refill  . ALPRAZolam (XANAX) 0.5 MG tablet Take 0.25 mg by mouth daily as needed.       . butalbital-acetaminophen-caffeine (FIORICET, ESGIC) 50-325-40 MG per tablet Take 1 tablet by mouth 3 (three) times daily as  needed. May take 2 tablets at onset, then 1 tablet  every 8 hours prn.      . estradiol (VIVELLE-DOT) 0.05 MG/24HR Place 1 patch onto the skin 2 (two) times a week. 1/2 patch twice daily      . metoprolol succinate (TOPROL-XL) 25 MG 24 hr tablet Take 12.5 mg by mouth Daily.      . progesterone (PROMETRIUM) 200 MG capsule Take 200 mg by mouth. Every 3-4 month.      . sertraline (ZOLOFT) 50 MG tablet Take 75 mg by mouth every morning.         Allergies: No Known Allergies   History  Substance Use Topics  . Smoking status: Never Smoker   . Smokeless tobacco: Never Used  . Alcohol Use: No      No family history on file.    ROS:  Please see the history of present illness.     All other systems reviewed and negative.   Vital Signs: Blood pressure 122/77, pulse 72, height 5' 5.2" (1.656 m), weight 132 lb 1.9 oz (59.929 kg).  PHYSICAL EXAM: General:  Well nourished, well developed female in no acute physical distress but evidently anxious HEENT: normal Lymph: no adenopathy Neck: no JVD Endocrine:  No thryomegaly Vascular: No carotid bruits; FA pulses 2+ bilaterally without bruits Cardiac:  normal S1, S2; RRR; no murmur Back: without kyphosis/scoliosis, no CVA tenderness Lungs:  clear to auscultation bilaterally, no wheezing, rhonchi or rales Abd: soft, nontender, no hepatomegaly Ext: no edema Musculoskeletal:  No deformities,  BUE and BLE strength normal and equal Skin: warm and dry Neuro:  CNs 2-12 intact, no focal abnormalities noted Psych:  Anxious and tearful  EKG:   Sinus rhythm without evidence of a delta wave  Labs:   Lab Results  Component Value Date   WBC 8.3 05/30/2012   HGB 13.2 05/30/2012   HCT 39.1 05/30/2012   MCV 95 05/30/2012   PLT 253 05/30/2012   No results found for this basename: NA,K,CL,CO2,BUN,CREATININE,CALCIUM,LABALBU,PROT,BILITOT,ALKPHOS,ALT,AST,GLUCOSE in the last 168 hours No results found for this basename: CKTOTAL:4,CKMB:4,TROPONINI:4 in the  last 72 hours No results found for this basename: CHOL, HDL, LDLCALC, TRIG   No results found for this basename: DDIMER   BNP No results found for this basename: probnp       ASSESSMENT AND PLAN:

## 2012-08-14 ENCOUNTER — Telehealth: Payer: Self-pay | Admitting: Internal Medicine

## 2012-08-14 DIAGNOSIS — R002 Palpitations: Secondary | ICD-10-CM

## 2012-08-14 DIAGNOSIS — Z8249 Family history of ischemic heart disease and other diseases of the circulatory system: Secondary | ICD-10-CM

## 2012-08-14 NOTE — Telephone Encounter (Signed)
plz return call to patient husband [phone] regarding referals

## 2012-08-14 NOTE — Telephone Encounter (Signed)
Pt has an appoint with dr Lovena Le 9/4 and advised to dicuss CAT scan with dr taylor--pt agrees

## 2012-08-15 ENCOUNTER — Ambulatory Visit (INDEPENDENT_AMBULATORY_CARE_PROVIDER_SITE_OTHER): Payer: BC Managed Care – PPO | Admitting: Internal Medicine

## 2012-08-15 ENCOUNTER — Encounter: Payer: Self-pay | Admitting: Internal Medicine

## 2012-08-15 VITALS — BP 118/70 | HR 80 | Ht 66.0 in | Wt 131.8 lb

## 2012-08-15 DIAGNOSIS — F411 Generalized anxiety disorder: Secondary | ICD-10-CM

## 2012-08-15 DIAGNOSIS — I498 Other specified cardiac arrhythmias: Secondary | ICD-10-CM

## 2012-08-15 DIAGNOSIS — I471 Supraventricular tachycardia: Secondary | ICD-10-CM

## 2012-08-15 MED ORDER — FLECAINIDE ACETATE 50 MG PO TABS: 50.0000 mg | ORAL_TABLET | Freq: Two times a day (BID) | ORAL | Status: DC

## 2012-08-15 NOTE — Patient Instructions (Signed)
Your physician recommends that you schedule a follow-up appointment in: 4 weeks with Dr Lovena Le  Your physician has recommended you make the following change in your medication:  1) Start Flecainide 85m ---take one by mouth twice daily

## 2012-08-15 NOTE — Progress Notes (Signed)
HPI Carolyn Chaney is referred today by Dr. Caryl Comes for evaluation of tachycardia palpitations and for consideration for catheter ablation of SVT. The patient is 51 years old and has severe anxiety. She has had palpitations in the past and cardiac monitoring has demonstrated PVCs as well as atrial tachycardia. The patient had a particularly in a bad episode which occurred while she was playing tennis. She was in the company of another physician. She was found to have a regular rapid pulse and this was terminated with vagal maneuvers. Since then she has had no recurrent sustained tachycardia palpitations though she does have nonsustained symptoms. Because of her recent sustained episode of SVT and because of worries over family problems, the patient has been quite anxious. She has difficulty sleeping. She has fear of going outside. At times she is afraid to get her car. When I discussed the etiology of her fears, she states it part of the fear is related to her concern about recurrent SVT. No Known Allergies   Current Outpatient Prescriptions  Medication Sig Dispense Refill  . ALPRAZolam (XANAX) 0.5 MG tablet Take 0.25 mg by mouth daily as needed.       Marland Kitchen estradiol (VIVELLE-DOT) 0.05 MG/24HR Place 1 patch onto the skin 2 (two) times a week. 1/2 patch twice daily      . metoprolol succinate (TOPROL-XL) 25 MG 24 hr tablet Take 12.5 mg by mouth Daily.      . progesterone (PROMETRIUM) 200 MG capsule Take 200 mg by mouth. Every 3-4 month.      . sertraline (ZOLOFT) 50 MG tablet Take 75 mg by mouth every morning.      . flecainide (TAMBOCOR) 50 MG tablet Take 1 tablet (50 mg total) by mouth 2 (two) times daily.  60 tablet  3     Past Medical History  Diagnosis Date  . Iron deficiency anemia 11/07/2011    ROS:   All systems reviewed and negative except as noted in the HPI.   No past surgical history on file.   No family history on file.   History   Social History  . Marital Status: Married   Spouse Name: N/A    Number of Children: N/A  . Years of Education: N/A   Occupational History  . Not on file.   Social History Main Topics  . Smoking status: Never Smoker   . Smokeless tobacco: Never Used  . Alcohol Use: No  . Drug Use: No  . Sexually Active: Not on file   Other Topics Concern  . Not on file   Social History Narrative  . No narrative on file     BP 118/70  Pulse 80  Ht 5' 6"  (1.676 m)  Wt 131 lb 12.8 oz (59.784 kg)  BMI 21.27 kg/m2  Physical Exam:  Well appearing middle-aged woman, NAD HEENT: Unremarkable Neck:  No JVD, no thyromegally Lungs:  Clear with no wheezes, rales, or rhonchi. HEART:  Regular rate rhythm, no murmurs, no rubs, no clicks Abd:  soft, positive bowel sounds, no organomegally, no rebound, no guarding Ext:  2 plus pulses, no edema, no cyanosis, no clubbing Skin:  No rashes no nodules Neuro:  CN II through XII intact, motor grossly intact  EKG Normal sinus rhythm with normal axis and intervals   Assess/Plan:

## 2012-08-15 NOTE — Assessment & Plan Note (Signed)
I discussed the likely etiology of the patient's SVT and treatment options. Because she has frequent palpitations and infrequent sustained arrhythmia, I've initially recommended antiarrhythmic drug therapy with flecainide. We also discussed proceeding with catheter ablation. It is my opinion that she would very likely need antiarrhythmic therapy because of her highly symptomatic palpitations. After discussing the pros and cons of both approaches, we have decided to start with low-dose flecainide. It is my hope that this medication will control her symptoms. We'll start with the lowest possible dose and plan to titrate upward based on symptom control. Ultimately, she will need to undergo exercise treadmill testing. We'll off on this until we know for sure that the medication is going to control her symptoms.

## 2012-08-15 NOTE — Assessment & Plan Note (Signed)
Her anxiety is exacerbated by her stress at home as well as her arrhythmia. I have asked the patient to seek professional help for control of her symptoms.

## 2012-08-21 NOTE — Telephone Encounter (Signed)
I left a message for the patient that I will call her back on Thursday.

## 2012-08-21 NOTE — Telephone Encounter (Signed)
F/u  Status of CT scan.

## 2012-08-28 NOTE — Telephone Encounter (Signed)
I spoke with the patient last Friday regarding cardiac CT. This did not require pre-cert, but may still not be covered by the patient's insurance. She wanted to know the out of pocket cost for this as she may be willing to pay for this herself. I notified her today that this may run from $1800- $2000. She is still thinking about this and will discuss with her husband. She feels very paralyzed by her fear of her family's cardiac history and the SVT she has experienced herself. She states she has multiple questions. I advised that if she is unsure that she knows what she wants to do, she may need to discuss further with Dr. Caryl Comes. She would like to have an office visit with him when her husband can come with her. I have scheduled her to follow up with Dr. Caryl Comes on 09/25/12. She is agreeable.

## 2012-09-03 ENCOUNTER — Ambulatory Visit: Payer: BC Managed Care – PPO

## 2012-09-13 ENCOUNTER — Ambulatory Visit (HOSPITAL_BASED_OUTPATIENT_CLINIC_OR_DEPARTMENT_OTHER): Payer: BC Managed Care – PPO | Admitting: Hematology & Oncology

## 2012-09-13 ENCOUNTER — Other Ambulatory Visit (HOSPITAL_BASED_OUTPATIENT_CLINIC_OR_DEPARTMENT_OTHER): Payer: BC Managed Care – PPO | Admitting: Lab

## 2012-09-13 VITALS — BP 119/71 | HR 67 | Temp 97.9°F | Resp 18 | Ht 66.0 in | Wt 132.0 lb

## 2012-09-13 DIAGNOSIS — D509 Iron deficiency anemia, unspecified: Secondary | ICD-10-CM

## 2012-09-13 DIAGNOSIS — F411 Generalized anxiety disorder: Secondary | ICD-10-CM

## 2012-09-13 LAB — CBC WITH DIFFERENTIAL (CANCER CENTER ONLY)
BASO%: 0.6 % (ref 0.0–2.0)
LYMPH%: 24.8 % (ref 14.0–48.0)
MCH: 32.7 pg (ref 26.0–34.0)
MCV: 96 fL (ref 81–101)
MONO#: 0.5 10*3/uL (ref 0.1–0.9)
MONO%: 7.4 % (ref 0.0–13.0)
NEUT#: 4.1 10*3/uL (ref 1.5–6.5)
Platelets: 246 10*3/uL (ref 145–400)
RDW: 12.1 % (ref 11.1–15.7)
WBC: 6.2 10*3/uL (ref 3.9–10.0)

## 2012-09-13 LAB — IRON AND TIBC
Iron: 107 ug/dL (ref 42–145)
UIBC: 155 ug/dL (ref 125–400)

## 2012-09-13 NOTE — Progress Notes (Signed)
This office note has been dictated.

## 2012-09-14 NOTE — Progress Notes (Signed)
CC:   Carolyn Chaney. Carolyn Chaney, M.D. Earle Gell, M.D. Phylliss Bob, MD  DIAGNOSIS:  Iron deficiency anemia.  CURRENT THERAPY:  Observation.  INTERIM HISTORY:  [name] gets comes in for followup.  She is doing okay.  She has a lot of anxiety right now.  I am not sure as to why she has the anxiety.  Her sister has anal cancer.  She is being treated at [facility].  It sounds like she is doing fairly well.  She is still dealing with her mom's death.  Her mom passed away [place] Day weekend.  She had small cell lung cancer.  She has had no bleeding.  She did have some issues with, I think, some gastritis or ulcers from taking a lot of nonsteroidals for back issues.  She did have a recent MRI done.  Apparently, the radiologist made comment that there were some marrow issues.  I really did not see anything that was of significance and likely reflected her past iron deficiency.  When we last checked her iron studies, her ferritin was 149. This was back in June.  Again, she has not had any bleeding.  She has had no nausea or vomiting. She just is quite anxious.  She is worried about her heart fluttering.  She is on Tambocor.  PHYSICAL EXAMINATION:  This is a well-developed, well-nourished white female in no obvious distress.  Vital signs:  Temperature of 97.9, pulse 67, respiratory rate 18, blood pressure 119/71.  Weight is 132.  Head and neck:  Normocephalic, atraumatic skull.  There are no ocular or oral lesions.  There are no palpable cervical or supraclavicular lymph nodes. Lungs:  Clear bilaterally.  Cardiac:  Regular rate and rhythm with a normal S1 and S2.  There are no murmurs, rubs or bruits.  Abdomen:  Soft with good bowel sounds.  There is no palpable abdominal mass.  There is no palpable hepatosplenomegaly.  Extremities:  No clubbing, cyanosis or edema.  Skin:  No rashes, ecchymosis or petechia.  LABORATORY STUDIES:  White cell count of 6.2, hemoglobin 13,  hematocrit 38, platelet count 246.  MCV is 96.  IMPRESSION:  [name] is a very nice 51 year old white female.  She is doing well from my point of view.  Her blood counts are holding steady.  I really do not think that iron deficiency is going to be a problem for her in the near future.  I think the iron deficiency that she developed was because of upper GI blood loss from excessive nonsteroidal use.  She is avoiding these right now.  I told [name] that we could have her come back as needed.  I just do not want to waste her time and tell her that everything is okay.  Again, if she does need to have her blood work done again, we can certainly get her back to see [place].  We certainly will pray for her sister with the anal cancer.  Hopefully, she should do okay with treatment.    ______________________________ [name], M.D. PRE/MEDQ  D:  09/13/2012  T:  09/14/2012  Job:  [id number]

## 2012-09-19 ENCOUNTER — Telehealth: Payer: Self-pay | Admitting: *Deleted

## 2012-09-19 NOTE — Telephone Encounter (Addendum)
Message copied by Orlando Penner on Wed Sep 19, 2012  5:47 PM ------      Message from: [name]      Created: Fri Sep 14, 2012 11:37 AM       Call - iron is still good!!!  Keep up the great job!!  Laurey Arrow This message given to pt.s husband.  Voiced understanding.

## 2012-09-20 ENCOUNTER — Ambulatory Visit: Payer: BC Managed Care – PPO | Admitting: Internal Medicine

## 2012-09-21 ENCOUNTER — Ambulatory Visit: Payer: BC Managed Care – PPO | Admitting: Internal Medicine

## 2012-09-25 ENCOUNTER — Ambulatory Visit: Payer: BC Managed Care – PPO | Admitting: Internal Medicine

## 2012-09-25 ENCOUNTER — Encounter: Payer: Self-pay | Admitting: Internal Medicine

## 2012-09-25 ENCOUNTER — Ambulatory Visit (INDEPENDENT_AMBULATORY_CARE_PROVIDER_SITE_OTHER): Payer: BC Managed Care – PPO | Admitting: Internal Medicine

## 2012-09-25 VITALS — BP 112/72 | HR 74 | Ht 66.0 in | Wt 134.0 lb

## 2012-09-25 DIAGNOSIS — Z8249 Family history of ischemic heart disease and other diseases of the circulatory system: Secondary | ICD-10-CM

## 2012-09-25 DIAGNOSIS — I25810 Atherosclerosis of coronary artery bypass graft(s) without angina pectoris: Secondary | ICD-10-CM

## 2012-09-25 DIAGNOSIS — F411 Generalized anxiety disorder: Secondary | ICD-10-CM

## 2012-09-25 DIAGNOSIS — I498 Other specified cardiac arrhythmias: Secondary | ICD-10-CM

## 2012-09-25 DIAGNOSIS — I471 Supraventricular tachycardia: Secondary | ICD-10-CM

## 2012-09-25 LAB — LDL CHOLESTEROL, DIRECT: Direct LDL: 137.1 mg/dL

## 2012-09-25 LAB — LIPID PANEL
HDL: 55.5 mg/dL (ref >39.00)
VLDL: 30 mg/dL (ref 0.0–40.0)

## 2012-09-25 NOTE — Assessment & Plan Note (Signed)
As above.

## 2012-09-25 NOTE — Assessment & Plan Note (Signed)
He had discussed catheter ablation with Dr. Lovena Le. Because of other flutters and ectopy, and the concerns about noninducibility, the recommendation initially was to try flecainide. She has been reluctant to do this because of concerns. We will proceed with undertaking calcium scoring for coronary disease and if that is normal proceed with flecainide

## 2012-09-25 NOTE — Progress Notes (Signed)
Patient Care Team: Horatio Pel, MD as PCP - General (Internal Medicine)   HPI  Carolyn Chaney is a 51 y.o. female Seen in followup for palpitations related to atrial tachycardia. We discussed the possibility of catheter ablation and she saw Dr. Lovena Le. They decided to undertake trial w flecanide  She also had a great deal of concern about coronary disease and we offer the possibility of self pay calcium scoring;  she has not consummated the latter and she did not start the flecainide as she did a bunch of research and found herself quite anxious about it.   Past Medical History  Diagnosis Date  . Iron deficiency anemia 11/07/2011    No past surgical history on file.  Current Outpatient Prescriptions  Medication Sig Dispense Refill  . acetaminophen (ACETAMINOPHEN EXTRA STRENGTH) 500 MG tablet Take 500 mg by mouth every 6 (six) hours as needed.      . ALPRAZolam (XANAX) 0.5 MG tablet Take 0.25 mg by mouth daily as needed.       Marland Kitchen estradiol (VIVELLE-DOT) 0.05 MG/24HR Place 1 patch onto the skin 2 (two) times a week. 1/2 patch twice daily      . flecainide (TAMBOCOR) 50 MG tablet Take 1 tablet (50 mg total) by mouth 2 (two) times daily.  60 tablet  3  . metoprolol succinate (TOPROL-XL) 25 MG 24 hr tablet Take 12.5 mg by mouth Daily.      . progesterone (PROMETRIUM) 200 MG capsule Take 200 mg by mouth. Takes for 12 days every 3-4 month.      . sertraline (ZOLOFT) 50 MG tablet Take 75 mg by mouth every morning.        No Known Allergies  Review of Systems negative except from HPI and PMH  Physical Exam There were no vitals taken for this visit. Well developed and well nourished in no acute distress HENT normal E scleral and icterus clear Neck Supple Clear to ausculation regular rate and rhythm, no murmurs gallops or rub Soft with active bowel sounds No clubbing cyanosis none Edema Alert and oriented, grossly normal motor and sensory function Skin Warm and  Dry    Assessment and  Plan

## 2012-09-25 NOTE — Patient Instructions (Addendum)
Your physician recommends that you schedule a follow-up appointment in: 3 months with Dr. Caryl Comes.  Your physician has requested that you have cardiac CT with calcium score. Cardiac computed tomography (CT) is a painless test that uses an x-ray machine to take clear, detailed pictures of your heart. For further information please visit [email or web address]. Please follow instruction sheet as given.   LABS TODAY:  LIPIDS & DIRECT LDL

## 2012-09-27 ENCOUNTER — Other Ambulatory Visit: Payer: BC Managed Care – PPO

## 2012-10-01 ENCOUNTER — Ambulatory Visit
Admission: RE | Admit: 2012-10-01 | Discharge: 2012-10-01 | Disposition: A | Discharge status: Home / Self Care | Payer: BC Managed Care – PPO | Source: Ambulatory Visit | Attending: Gynecology | Admitting: Gynecology

## 2012-10-01 DIAGNOSIS — Z1231 Encounter for screening mammogram for malignant neoplasm of breast: Secondary | ICD-10-CM

## 2012-10-03 ENCOUNTER — Other Ambulatory Visit: Payer: Self-pay | Admitting: Internal Medicine

## 2012-10-03 DIAGNOSIS — E049 Nontoxic goiter, unspecified: Secondary | ICD-10-CM

## 2012-10-05 ENCOUNTER — Other Ambulatory Visit: Payer: BC Managed Care – PPO

## 2012-10-08 ENCOUNTER — Ambulatory Visit
Admission: RE | Admit: 2012-10-08 | Discharge: 2012-10-08 | Disposition: A | Discharge status: Home / Self Care | Payer: BC Managed Care – PPO | Source: Ambulatory Visit | Attending: Internal Medicine | Admitting: Internal Medicine

## 2012-10-08 DIAGNOSIS — E049 Nontoxic goiter, unspecified: Secondary | ICD-10-CM

## 2012-10-17 ENCOUNTER — Other Ambulatory Visit: Payer: Self-pay | Admitting: Orthopedic Surgery

## 2012-10-17 DIAGNOSIS — M533 Sacrococcygeal disorders, not elsewhere classified: Secondary | ICD-10-CM

## 2012-10-24 ENCOUNTER — Other Ambulatory Visit: Payer: BC Managed Care – PPO

## 2012-10-29 ENCOUNTER — Other Ambulatory Visit: Payer: BC Managed Care – PPO

## 2012-12-25 ENCOUNTER — Ambulatory Visit: Payer: BC Managed Care – PPO | Admitting: Internal Medicine

## 2012-12-27 ENCOUNTER — Ambulatory Visit
Admission: RE | Admit: 2012-12-27 | Discharge: 2012-12-27 | Disposition: A | Discharge status: Home / Self Care | Payer: BC Managed Care – PPO | Source: Ambulatory Visit | Attending: Orthopedic Surgery | Admitting: Orthopedic Surgery

## 2012-12-27 VITALS — BP 122/71 | HR 68

## 2012-12-27 DIAGNOSIS — M533 Sacrococcygeal disorders, not elsewhere classified: Secondary | ICD-10-CM

## 2012-12-27 MED ORDER — DIAZEPAM 5 MG PO TABS
10.0000 mg | ORAL_TABLET | Freq: Once | ORAL | Status: AC
  Administered 2012-12-27: 5 mg via ORAL

## 2013-01-02 ENCOUNTER — Other Ambulatory Visit: Payer: Self-pay | Admitting: Gynecology

## 2013-02-01 ENCOUNTER — Encounter: Payer: Self-pay | Admitting: Internal Medicine

## 2013-02-01 ENCOUNTER — Ambulatory Visit (INDEPENDENT_AMBULATORY_CARE_PROVIDER_SITE_OTHER): Payer: BC Managed Care – PPO | Admitting: Internal Medicine

## 2013-02-01 VITALS — BP 107/78 | HR 62 | Ht 65.0 in | Wt 136.4 lb

## 2013-02-01 DIAGNOSIS — Z8249 Family history of ischemic heart disease and other diseases of the circulatory system: Secondary | ICD-10-CM

## 2013-02-01 DIAGNOSIS — I498 Other specified cardiac arrhythmias: Secondary | ICD-10-CM

## 2013-02-01 DIAGNOSIS — I471 Supraventricular tachycardia: Secondary | ICD-10-CM

## 2013-02-01 NOTE — Assessment & Plan Note (Signed)
Continue current medications. She is disinclined to take flecainide

## 2013-02-01 NOTE — Assessment & Plan Note (Signed)
We'll review again the potential cost of calcium scoring

## 2013-02-01 NOTE — Progress Notes (Signed)
Patient Care Team: Horatio Pel, MD as PCP - General (Internal Medicine)   HPI  Carolyn Chaney is a 52 y.o. female Seen in followup for palpitations related to atrial tachycardia. We discussed the possibility of catheter ablation and she saw Dr. Lovena Le. They decided to undertake trial w flecanide  She also had a great deal of concern about coronary disease and we offer the possibility of self pay calcium scoring;  she has not consummated the latter and she did not start the flecainide as she did a bunch of research and found herself quite anxious about it.she never undertook calcium scoring.     Past Medical History  Diagnosis Date  . Iron deficiency anemia 11/07/2011    No past surgical history on file.  Current Outpatient Prescriptions  Medication Sig Dispense Refill  . ALPRAZolam (XANAX) 0.5 MG tablet Take 0.25 mg by mouth daily as needed.       Marland Kitchen estradiol (VIVELLE-DOT) 0.05 MG/24HR Place 1 patch onto the skin as directed. .375 mg twice a week      . metoprolol succinate (TOPROL-XL) 25 MG 24 hr tablet Take 12.5 mg by mouth Daily.      . sertraline (ZOLOFT) 50 MG tablet Take 100 mg by mouth every morning.       . flecainide (TAMBOCOR) 50 MG tablet Take 1 tablet (50 mg total) by mouth 2 (two) times daily.  60 tablet  3   No current facility-administered medications for this visit.    Allergies  Allergen Reactions  . Nsaids Other (See Comments)    Bleeding    Review of Systems negative except from HPI and PMH  Physical Exam BP 107/78  Pulse 62  Ht 5' 5"  (1.651 m)  Wt 136 lb 6.4 oz (61.871 kg)  BMI 22.7 kg/m2 Well developed and well nourished in no acute distress HENT normal E scleral and icterus clear Neck Supple Clear to ausculation regular rate and rhythm, no murmurs gallops or rub Soft with active bowel sounds No clubbing cyanosis none Edema Alert and oriented, grossly normal motor and sensory function Skin Warm and Dry   ECG demonstrates sinus  rhythm at 65 next Interval 17/09/39 axis is 78 RSR prime Possible LAE Otherwise normal Assessment and  Plan

## 2013-02-18 ENCOUNTER — Other Ambulatory Visit: Payer: Self-pay | Admitting: *Deleted

## 2013-02-18 MED ORDER — METOPROLOL SUCCINATE ER 25 MG PO TB24: 12.5000 mg | ORAL_TABLET | Freq: Every day | ORAL | Status: DC

## 2013-06-21 ENCOUNTER — Telehealth: Payer: Self-pay | Admitting: *Deleted

## 2013-06-21 NOTE — Telephone Encounter (Signed)
Pt called wanting to know what she could take for an injury she has to her foot. It was recommended she take an anti-inflammatory but was told in the past not to.

## 2013-06-22 ENCOUNTER — Encounter (HOSPITAL_BASED_OUTPATIENT_CLINIC_OR_DEPARTMENT_OTHER): Payer: Self-pay | Admitting: *Deleted

## 2013-06-22 ENCOUNTER — Emergency Department (HOSPITAL_BASED_OUTPATIENT_CLINIC_OR_DEPARTMENT_OTHER): Payer: BC Managed Care – PPO

## 2013-06-22 ENCOUNTER — Emergency Department (HOSPITAL_BASED_OUTPATIENT_CLINIC_OR_DEPARTMENT_OTHER)
Admission: EM | Admit: 2013-06-22 | Discharge: 2013-06-22 | Disposition: A | Discharge status: Home / Self Care | Payer: BC Managed Care – PPO | Attending: Emergency Medicine | Admitting: Emergency Medicine

## 2013-06-22 DIAGNOSIS — Z862 Personal history of diseases of the blood and blood-forming organs and certain disorders involving the immune mechanism: Secondary | ICD-10-CM | POA: Insufficient documentation

## 2013-06-22 DIAGNOSIS — Z8679 Personal history of other diseases of the circulatory system: Secondary | ICD-10-CM | POA: Insufficient documentation

## 2013-06-22 DIAGNOSIS — M79604 Pain in right leg: Secondary | ICD-10-CM

## 2013-06-22 DIAGNOSIS — Z8719 Personal history of other diseases of the digestive system: Secondary | ICD-10-CM | POA: Insufficient documentation

## 2013-06-22 DIAGNOSIS — M25579 Pain in unspecified ankle and joints of unspecified foot: Secondary | ICD-10-CM | POA: Insufficient documentation

## 2013-06-22 DIAGNOSIS — Z79899 Other long term (current) drug therapy: Secondary | ICD-10-CM | POA: Insufficient documentation

## 2013-06-22 HISTORY — DX: Gastric ulcer, unspecified as acute or chronic, without hemorrhage or perforation: K25.9

## 2013-06-22 HISTORY — DX: Cardiac arrhythmia, unspecified: I49.9

## 2013-06-22 LAB — BASIC METABOLIC PANEL
BUN: 12 mg/dL (ref 6–23)
CO2: 29 mEq/L (ref 19–32)
Calcium: 9.9 mg/dL (ref 8.4–10.5)
Creatinine, Ser: 0.8 mg/dL (ref 0.50–1.10)
GFR calc non Af Amer: 83 mL/min — ABNORMAL LOW (ref >90)
Glucose, Bld: 94 mg/dL (ref 70–99)
Sodium: 141 mEq/L (ref 135–145)

## 2013-06-22 LAB — CBC WITH DIFFERENTIAL/PLATELET
Eosinophils Absolute: 0.1 10*3/uL (ref 0.0–0.7)
Eosinophils Relative: 3 % (ref 0–5)
HCT: 37.5 % (ref 36.0–46.0)
Lymphs Abs: 1.2 10*3/uL (ref 0.7–4.0)
MCH: 31.4 pg (ref 26.0–34.0)
MCV: 94.2 fL (ref 78.0–100.0)
Monocytes Absolute: 0.4 10*3/uL (ref 0.1–1.0)
Platelets: 218 10*3/uL (ref 150–400)
RBC: 3.98 MIL/uL (ref 3.87–5.11)

## 2013-06-22 MED ORDER — TRAMADOL HCL 50 MG PO TABS: 50.0000 mg | ORAL_TABLET | Freq: Four times a day (QID) | ORAL | Status: DC | PRN

## 2013-06-22 NOTE — ED Provider Notes (Signed)
History    CSN: [id number] Arrival date & time 06/22/13  91  First MD Initiated Contact with Patient 06/22/13 1448     Chief Complaint  Patient presents with  . Leg Pain   (Consider location/radiation/quality/duration/timing/severity/associated sxs/prior Treatment) HPI Comments: Patient is a 52 year old female who presents today with right ankle pain since July 4th. She has no known trauma to her foot or ankle. She woke up with the pain after walking on the beach on July 4. She took meloxicam and the pain began to improve. On Wednesday she became significantly worse without any new trauma. She describes the pain as throbbing and worse with walking. Hydrocodone has helped the pain. No fevers, chills, numbness, tingling, weakness, parathesias. She recently had a long trip to the beach.    The history is provided by the patient. No language interpreter was used.   Past Medical History  Diagnosis Date  . Iron deficiency anemia 11/07/2011  . Irregular heart rate   . Gastric ulcer    Past Surgical History  Procedure Laterality Date  . Appendectomy    . Tonsillectomy    . Breast surgery     History reviewed. No pertinent family history. History  Substance Use Topics  . Smoking status: Never Smoker   . Smokeless tobacco: Never Used  . Alcohol Use: No   OB History   Grav Para Term Preterm Abortions TAB SAB Ect Mult Living                 Review of Systems  Constitutional: Negative for fever and chills.  Respiratory: Negative for shortness of breath.   Gastrointestinal: Negative for nausea, vomiting and abdominal pain.  Musculoskeletal: Positive for myalgias, joint swelling, arthralgias and gait problem.  All other systems reviewed and are negative.    Allergies  Nsaids  Home Medications   Current Outpatient Rx  Name  Route  Sig  Dispense  Refill  . progesterone (PROMETRIUM) 200 MG capsule   Oral   Take 200 mg by mouth See admin instructions.         .  ALPRAZolam (XANAX) 0.5 MG tablet   Oral   Take 0.25 mg by mouth daily as needed.          Marland Kitchen estradiol (VIVELLE-DOT) 0.05 MG/24HR   Transdermal   Place 1 patch onto the skin as directed. .375 mg twice a week         . flecainide (TAMBOCOR) 50 MG tablet   Oral   Take 1 tablet (50 mg total) by mouth 2 (two) times daily.   60 tablet   3   . metoprolol succinate (TOPROL-XL) 25 MG 24 hr tablet   Oral   Take 0.5 tablets (12.5 mg total) by mouth daily.   30 tablet   3   . sertraline (ZOLOFT) 50 MG tablet   Oral   Take 100 mg by mouth every morning.           BP 106/70  Pulse 78  Temp(Src) 98.1 F (36.7 C) (Oral)  Resp 16  Ht 5' 5.5" (1.664 m)  Wt 136 lb (61.689 kg)  BMI 22.28 kg/m2  SpO2 100% Physical Exam  Nursing note and vitals reviewed. Constitutional: She is oriented to person, place, and time. She appears well-developed and well-nourished. No distress.  HENT:  Head: Normocephalic and atraumatic.  Right Ear: External ear normal.  Left Ear: External ear normal.  Nose: Nose normal.  Mouth/Throat: Oropharynx is clear  and moist.  Eyes: Conjunctivae are normal.  Neck: Normal range of motion.  Cardiovascular: Normal rate, regular rhythm and normal heart sounds.   Pulmonary/Chest: Effort normal and breath sounds normal. No stridor. No respiratory distress. She has no wheezes. She has no rales.  Abdominal: Soft. She exhibits no distension.  Musculoskeletal: Normal range of motion.  Tenderness to palpation along lateral aspect of right foot and ankle; swelling without erythema. Neurovascularly intact. Compartment soft.   Neurological: She is alert and oriented to person, place, and time. She has normal strength.  Skin: Skin is warm and dry. She is not diaphoretic. No erythema.  Psychiatric: She has a normal mood and affect. Her behavior is normal.    ED Course  Procedures (including critical care time) Labs Reviewed  BASIC METABOLIC PANEL - Abnormal; Notable for  the following:    GFR calc non Af Amer 83 (*)    All other components within normal limits  CBC WITH DIFFERENTIAL  SEDIMENTATION RATE  RHEUMATOID FACTOR  URIC ACID   Dg Tibia/fibula Right  06/22/2013   *RADIOLOGY REPORT*  Clinical Data: Right lower leg pain.  RIGHT TIBIA AND FIBULA - 2 VIEW  Comparison:  None.  Findings: There is no evidence of fracture or other focal bone lesions.  Soft tissues are unremarkable.  IMPRESSION: Negative.   Original Report Authenticated By: Aletta Edouard, M.D.   Dg Ankle Complete Right  06/22/2013   *RADIOLOGY REPORT*  Clinical Data: Right ankle pain.  RIGHT ANKLE - COMPLETE 3+ VIEW  Comparison: None.  Findings: No acute fracture or dislocation is identified.  No significant arthropathy is seen.  No bony lesions or destruction identified.  Soft tissues are unremarkable.  IMPRESSION: Normal right ankle.   Original Report Authenticated By: Aletta Edouard, M.D.   1. Right leg pain     MDM  Patient presents with right leg pain and swelling with no known injury since July 4. XR negative. [place] negative for DVT. Uric acid, rheumatoid fact, sed rate, cbc and bmp all wnl. Follow up with orthopedics. Dr. Monia Pouch evaluated this patient and agrees with plan. Return instructions given. Vital signs stable for discharge.  Patient / Family / Caregiver informed of clinical course, understand medical decision-making process, and agree with plan.   Elwyn Lade, PA-C 06/25/13 2044

## 2013-06-22 NOTE — ED Notes (Signed)
Pt c/o pain to lateral aspect of right leg and ankle. No known injury. Unable to bear wt. PMS intact. Ice applied at triage.

## 2013-06-23 LAB — RHEUMATOID FACTOR: Rhuematoid fact SerPl-aCnc: <10 IU/mL (ref <=14)

## 2013-06-29 NOTE — ED Provider Notes (Signed)
Medical screening examination/treatment/procedure(s) were conducted as a shared visit with non-physician practitioner(s) and myself.  I personally evaluated the patient during the encounter Has pain in right ankle, mild tenderness just above right lateral malleolus.  X-rays and lab workup negative.  Mylinda Latina III, MD 06/29/13 1056

## 2013-09-06 ENCOUNTER — Other Ambulatory Visit: Payer: Self-pay

## 2013-09-06 DIAGNOSIS — Z1231 Encounter for screening mammogram for malignant neoplasm of breast: Secondary | ICD-10-CM

## 2013-10-02 ENCOUNTER — Ambulatory Visit: Payer: BC Managed Care – PPO

## 2013-10-05 ENCOUNTER — Other Ambulatory Visit: Payer: Self-pay | Admitting: Internal Medicine

## 2013-10-17 ENCOUNTER — Other Ambulatory Visit: Payer: Self-pay

## 2013-10-31 ENCOUNTER — Ambulatory Visit
Admission: RE | Admit: 2013-10-31 | Discharge: 2013-10-31 | Disposition: A | Discharge status: Home / Self Care | Payer: BC Managed Care – PPO | Source: Ambulatory Visit

## 2013-10-31 DIAGNOSIS — Z1231 Encounter for screening mammogram for malignant neoplasm of breast: Secondary | ICD-10-CM

## 2013-11-04 ENCOUNTER — Other Ambulatory Visit: Payer: Self-pay | Admitting: Internal Medicine

## 2013-11-05 ENCOUNTER — Ambulatory Visit: Payer: BC Managed Care – PPO | Admitting: Sports Medicine

## 2014-01-22 ENCOUNTER — Other Ambulatory Visit: Payer: Self-pay | Admitting: Internal Medicine

## 2014-03-19 ENCOUNTER — Other Ambulatory Visit: Payer: Self-pay | Admitting: *Deleted

## 2014-03-19 MED ORDER — METOPROLOL SUCCINATE ER 25 MG PO TB24: ORAL_TABLET | ORAL | Status: DC

## 2014-07-03 ENCOUNTER — Encounter: Payer: Self-pay | Admitting: Cardiovascular Disease

## 2014-09-05 ENCOUNTER — Other Ambulatory Visit: Payer: Self-pay | Admitting: Internal Medicine

## 2014-09-29 ENCOUNTER — Other Ambulatory Visit: Payer: Self-pay

## 2014-09-29 DIAGNOSIS — Z1239 Encounter for other screening for malignant neoplasm of breast: Secondary | ICD-10-CM

## 2014-10-30 ENCOUNTER — Other Ambulatory Visit: Payer: Self-pay

## 2014-10-30 DIAGNOSIS — Z1231 Encounter for screening mammogram for malignant neoplasm of breast: Secondary | ICD-10-CM

## 2014-11-03 ENCOUNTER — Ambulatory Visit
Admission: RE | Admit: 2014-11-03 | Discharge: 2014-11-03 | Disposition: A | Discharge status: Home / Self Care | Payer: BC Managed Care – PPO | Source: Ambulatory Visit

## 2014-11-03 DIAGNOSIS — Z1231 Encounter for screening mammogram for malignant neoplasm of breast: Secondary | ICD-10-CM

## 2014-11-04 ENCOUNTER — Other Ambulatory Visit: Payer: Self-pay | Admitting: Internal Medicine

## 2014-11-05 ENCOUNTER — Other Ambulatory Visit: Payer: Self-pay | Admitting: Internal Medicine

## 2014-11-05 ENCOUNTER — Other Ambulatory Visit: Payer: Self-pay | Admitting: *Deleted

## 2014-11-05 DIAGNOSIS — N63 Unspecified lump in unspecified breast: Secondary | ICD-10-CM

## 2014-11-05 MED ORDER — METOPROLOL SUCCINATE ER 25 MG PO TB24: 12.5000 mg | ORAL_TABLET | Freq: Every day | ORAL | Status: DC

## 2014-11-18 ENCOUNTER — Other Ambulatory Visit: Payer: Self-pay | Admitting: Gynecology

## 2014-11-18 DIAGNOSIS — N63 Unspecified lump in unspecified breast: Secondary | ICD-10-CM

## 2014-11-27 ENCOUNTER — Other Ambulatory Visit: Payer: Self-pay | Admitting: Nurse Practitioner

## 2014-12-10 ENCOUNTER — Ambulatory Visit
Admission: RE | Admit: 2014-12-10 | Discharge: 2014-12-10 | Disposition: A | Discharge status: Home / Self Care | Payer: BC Managed Care – PPO | Source: Ambulatory Visit | Attending: Gynecology | Admitting: Gynecology

## 2014-12-10 DIAGNOSIS — N63 Unspecified lump in unspecified breast: Secondary | ICD-10-CM

## 2014-12-25 ENCOUNTER — Other Ambulatory Visit: Payer: Self-pay | Admitting: Gynecology

## 2014-12-25 ENCOUNTER — Other Ambulatory Visit: Payer: Self-pay

## 2014-12-25 DIAGNOSIS — N6310 Unspecified lump in the right breast, unspecified quadrant: Secondary | ICD-10-CM

## 2015-01-01 ENCOUNTER — Ambulatory Visit
Admission: RE | Admit: 2015-01-01 | Discharge: 2015-01-01 | Disposition: A | Discharge status: Home / Self Care | Payer: BLUE CROSS/BLUE SHIELD | Source: Ambulatory Visit | Attending: Gynecology | Admitting: Gynecology

## 2015-01-01 ENCOUNTER — Other Ambulatory Visit: Payer: Self-pay | Admitting: Gynecology

## 2015-01-01 DIAGNOSIS — N6310 Unspecified lump in the right breast, unspecified quadrant: Secondary | ICD-10-CM

## 2015-01-02 ENCOUNTER — Ambulatory Visit: Payer: BC Managed Care – PPO | Admitting: Internal Medicine

## 2015-01-27 ENCOUNTER — Telehealth: Payer: Self-pay | Admitting: Internal Medicine

## 2015-01-27 ENCOUNTER — Other Ambulatory Visit: Payer: Self-pay | Admitting: Internal Medicine

## 2015-01-27 MED ORDER — METOPROLOL SUCCINATE ER 25 MG PO TB24: 12.5000 mg | ORAL_TABLET | Freq: Every day | ORAL | Status: DC

## 2015-01-27 NOTE — Telephone Encounter (Signed)
Patient just wanted to get refill for her Toprol to get her through till she sees Dr. Caryl Comes next month.  She had to reschedule January appt due to weather.  I told her we would be more than happy to help with this.  Rx sent for next few months. She was grateful for the help.

## 2015-01-27 NOTE — Telephone Encounter (Signed)
New Messsage  Pt requests to speak w/ Rn. Please call back and discuss.

## 2015-03-02 ENCOUNTER — Encounter: Payer: Self-pay | Admitting: Internal Medicine

## 2015-03-02 ENCOUNTER — Ambulatory Visit (INDEPENDENT_AMBULATORY_CARE_PROVIDER_SITE_OTHER): Payer: BLUE CROSS/BLUE SHIELD | Admitting: Internal Medicine

## 2015-03-02 ENCOUNTER — Ambulatory Visit: Payer: BLUE CROSS/BLUE SHIELD | Admitting: Internal Medicine

## 2015-03-02 VITALS — BP 104/72 | HR 72 | Ht 66.0 in | Wt 142.8 lb

## 2015-03-02 DIAGNOSIS — I471 Supraventricular tachycardia: Secondary | ICD-10-CM | POA: Diagnosis not present

## 2015-03-02 MED ORDER — METOPROLOL SUCCINATE ER 25 MG PO TB24: 12.5000 mg | ORAL_TABLET | Freq: Every day | ORAL | Status: DC

## 2015-03-02 NOTE — Progress Notes (Signed)
No charge refills

## 2015-03-02 NOTE — Patient Instructions (Signed)
Your physician recommends that you continue on your current medications as directed. Please refer to the Current Medication list given to you today.   Your physician wants you to follow-up in: As needed, atleast once a year. You will receive a reminder letter in the mail two months in advance. If you don't receive a letter, please call our office to schedule the follow-up appointment.

## 2015-03-12 ENCOUNTER — Ambulatory Visit: Payer: BLUE CROSS/BLUE SHIELD | Admitting: Internal Medicine

## 2015-05-26 ENCOUNTER — Other Ambulatory Visit: Payer: Self-pay | Admitting: Internal Medicine

## 2015-06-05 ENCOUNTER — Other Ambulatory Visit: Payer: Self-pay | Admitting: Internal Medicine

## 2016-01-08 ENCOUNTER — Other Ambulatory Visit: Payer: Self-pay

## 2016-01-08 DIAGNOSIS — Z1231 Encounter for screening mammogram for malignant neoplasm of breast: Secondary | ICD-10-CM

## 2016-01-14 ENCOUNTER — Ambulatory Visit: Payer: BLUE CROSS/BLUE SHIELD

## 2016-01-18 ENCOUNTER — Ambulatory Visit
Admission: RE | Admit: 2016-01-18 | Discharge: 2016-01-18 | Disposition: A | Discharge status: Home / Self Care | Payer: BLUE CROSS/BLUE SHIELD | Source: Ambulatory Visit

## 2016-01-18 DIAGNOSIS — Z1231 Encounter for screening mammogram for malignant neoplasm of breast: Secondary | ICD-10-CM

## 2016-01-27 ENCOUNTER — Ambulatory Visit: Payer: BLUE CROSS/BLUE SHIELD

## 2016-01-27 ENCOUNTER — Other Ambulatory Visit (HOSPITAL_BASED_OUTPATIENT_CLINIC_OR_DEPARTMENT_OTHER): Payer: BLUE CROSS/BLUE SHIELD

## 2016-01-27 ENCOUNTER — Encounter: Payer: Self-pay | Admitting: Hematology & Oncology

## 2016-01-27 ENCOUNTER — Ambulatory Visit: Payer: Self-pay | Admitting: Hematology & Oncology

## 2016-01-27 ENCOUNTER — Ambulatory Visit (HOSPITAL_BASED_OUTPATIENT_CLINIC_OR_DEPARTMENT_OTHER): Payer: BLUE CROSS/BLUE SHIELD | Admitting: Hematology & Oncology

## 2016-01-27 ENCOUNTER — Other Ambulatory Visit: Payer: Self-pay

## 2016-01-27 VITALS — BP 111/67 | HR 84 | Temp 97.6°F | Resp 18 | Ht 66.0 in | Wt 143.0 lb

## 2016-01-27 DIAGNOSIS — Z8249 Family history of ischemic heart disease and other diseases of the circulatory system: Secondary | ICD-10-CM

## 2016-01-27 DIAGNOSIS — E611 Iron deficiency: Secondary | ICD-10-CM | POA: Diagnosis not present

## 2016-01-27 DIAGNOSIS — K922 Gastrointestinal hemorrhage, unspecified: Secondary | ICD-10-CM | POA: Insufficient documentation

## 2016-01-27 DIAGNOSIS — D509 Iron deficiency anemia, unspecified: Secondary | ICD-10-CM

## 2016-01-27 DIAGNOSIS — Z801 Family history of malignant neoplasm of trachea, bronchus and lung: Secondary | ICD-10-CM | POA: Diagnosis not present

## 2016-01-27 HISTORY — DX: Gastrointestinal hemorrhage, unspecified: K92.2

## 2016-01-27 LAB — CBC WITH DIFFERENTIAL (CANCER CENTER ONLY)
BASO#: 0.1 10*3/uL (ref 0.0–0.2)
BASO%: 1.1 % (ref 0.0–2.0)
EOS%: 2.6 % (ref 0.0–7.0)
Eosinophils Absolute: 0.1 10*3/uL (ref 0.0–0.5)
HCT: 29.1 % — ABNORMAL LOW (ref 34.8–46.6)
HGB: 9 g/dL — ABNORMAL LOW (ref 11.6–15.9)
LYMPH#: 1.2 10*3/uL (ref 0.9–3.3)
LYMPH%: 26.6 % (ref 14.0–48.0)
MCH: 27.7 pg (ref 26.0–34.0)
MCHC: 30.9 g/dL — ABNORMAL LOW (ref 32.0–36.0)
MCV: 90 fL (ref 81–101)
MONO#: 0.4 10*3/uL (ref 0.1–0.9)
MONO%: 8.1 % (ref 0.0–13.0)
NEUT#: 2.8 10*3/uL (ref 1.5–6.5)
NEUT%: 61.6 % (ref 39.6–80.0)
PLATELETS: 305 10*3/uL (ref 145–400)
RBC: 3.25 10*6/uL — AB (ref 3.70–5.32)
RDW: 12.2 % (ref 11.1–15.7)
WBC: 4.6 10*3/uL (ref 3.9–10.0)

## 2016-01-27 LAB — IRON AND TIBC
%SAT: 5 % — ABNORMAL LOW (ref 21–57)
IRON: 21 ug/dL — AB (ref 41–142)
TIBC: 423 ug/dL (ref 236–444)
UIBC: 402 ug/dL — ABNORMAL HIGH (ref 120–384)

## 2016-01-27 LAB — FERRITIN: FERRITIN: 4 ng/mL — AB (ref 9–269)

## 2016-01-27 LAB — CHCC SATELLITE - SMEAR

## 2016-01-27 NOTE — Progress Notes (Signed)
Referral MD  Reason for Referral: Recurrent iron deficiency anemia   Chief Complaint  Patient presents with  . OTHER    New Patient  : My iron is low again.  HPI: Carolyn Chaney is well-known to me. I've not seen her probably for about 3 years. I had seen her in the past for iron deficiency. She did well and then was lost to follow-up.  Recently, she's been found to be anemic again. She typically has GI bleeding. She's been evaluated thoroughly by gastroenterology. 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  Lab work that was done by her family doctor in January showed a white cell count 4.5. Hemoglobin 9.5. Hematocrit 30.1. And platelet count was 276,000. Her MCV was 93.  She brought some studies that she.off the can Issac Moure that was done on her. She had a ferritin of 6.  She was calmly referred back to the [facility] for an evaluation.  She recently, in October, was found to have Escherichia coli in the urine.  She has not noted any melena. Has not been any bright red blood per rectum. She's had no weight loss. She's gained a little bit awake she's not exercising.  She is chewing ice.  She's had no rashes. She's had no joint issues. She's had no cough or shortness of breath.  Overall, her performance status is ECOG 0.     Past Medical History  Diagnosis Date  . Iron deficiency anemia 11/07/2011  . Irregular heart rate   . Gastric ulcer   :  Past Surgical History  Procedure Laterality Date  . Appendectomy    . Tonsillectomy    . Breast surgery    :   Current outpatient prescriptions:  .  ALPRAZolam (XANAX) 0.5 MG tablet, Take 0.25 mg by mouth daily as needed. , Disp: , Rfl:  .  metoprolol succinate (TOPROL-XL) 25 MG 24 hr tablet, Take 0.5 tablets (12.5 mg total) by mouth daily., Disp: 90 tablet, Rfl: 3 .  sertraline (ZOLOFT) 50 MG tablet, Take 100 mg by mouth every morning. , Disp: , Rfl: :  :  Allergies  Allergen Reactions  .  Nsaids Other (See Comments)    Bleeding  :  Family History  Problem Relation Age of Onset  . Lung cancer Mother   . Heart attack Father   . Heart disease Father   . Heart attack Sister   :  Social History   Social History  . Marital Status: Married    Spouse Name: N/A  . Number of Children: N/A  . Years of Education: N/A   Occupational History  . Not on file.   Social History Main Topics  . Smoking status: Never Smoker   . Smokeless tobacco: Never Used  . Alcohol Use: No  . Drug Use: No  . Sexual Activity: Not on file   Other Topics Concern  . Not on file   Social History Narrative  :  Pertinent items are noted in HPI.  Exam: @IPVITALS @  well-developed and well-nourished white female in no obvious distress. Vital signs are temperature of 97.6. Pulse 84. Blood pressure 111/67. Weight is 143 pounds. Head and neck exam shows no ocular or oral lesions. She has no palpable cervical or supraclavicular lymph nodes. Lungs are clear. Cardiac exam regular rate and rhythm with no murmurs, rubs or bruits. Abdomen is soft. She has good bowel sounds. There is no fluid wave. There is no palpable liver or spleen  tip. Back exam shows no tenderness over the spine, ribs or hips. Extremities shows no clubbing, cyanosis or edema. Neurological exam shows no focal neurological deficits. Skin exam shows no rashes, ecchymoses or petechia.    Recent Labs  01/27/16 1033  WBC 4.6  HGB 9.0*  HCT 29.1*  PLT 305   No results for input(s): NA, K, CL, CO2, GLUCOSE, BUN, CREATININE, CALCIUM in the last 72 hours.  Blood smear review: Some mild anisocytosis and poikilocytosis. She has some microcytic red cells. She has some hypochromic red cells.  She has no nucleated red cells. She has no teardrop cells. I see no inclusion bodies. She has no target cells. White blood cells show no hypersegmented polys. She has no immature myeloid or lymphoid forms. Platelets are adequate in number and  size.  Pathology: None     Assessment and Plan:  Carolyn Chaney is a very charming 55 year old white female. She has had a past history of iron deficiency. Again she is iron deficient.  Her ferritin is only 4 with an iron saturation of 5%.  I suspect that she probably is bleeding again. She does see gastroenterology. I don't think she's ever had a camera endoscopy of the small bowel. I will let them decide as whether not they want to do this.  We will see by given her IV iron. This always works for her. I'll give her a couple doses and then we will plan to get her back about a month later.  I spent about 45 minutes with her. She is very well aware of the fact that her iron is low.

## 2016-01-28 ENCOUNTER — Ambulatory Visit (HOSPITAL_BASED_OUTPATIENT_CLINIC_OR_DEPARTMENT_OTHER): Payer: BLUE CROSS/BLUE SHIELD

## 2016-01-28 ENCOUNTER — Ambulatory Visit: Payer: BLUE CROSS/BLUE SHIELD | Admitting: Hematology & Oncology

## 2016-01-28 ENCOUNTER — Ambulatory Visit: Payer: BLUE CROSS/BLUE SHIELD

## 2016-01-28 ENCOUNTER — Other Ambulatory Visit: Payer: BLUE CROSS/BLUE SHIELD

## 2016-01-28 ENCOUNTER — Telehealth: Payer: Self-pay | Admitting: Hematology & Oncology

## 2016-01-28 VITALS — BP 116/67 | HR 69 | Temp 97.7°F | Resp 16

## 2016-01-28 DIAGNOSIS — D509 Iron deficiency anemia, unspecified: Secondary | ICD-10-CM

## 2016-01-28 DIAGNOSIS — K922 Gastrointestinal hemorrhage, unspecified: Secondary | ICD-10-CM

## 2016-01-28 LAB — RETICULOCYTES: Reticulocyte Count: 1.4 % (ref 0.6–2.6)

## 2016-01-28 MED ORDER — SODIUM CHLORIDE 0.9 % IV SOLN
Freq: Once | INTRAVENOUS | Status: AC
  Administered 2016-01-28: 16:00:00 via INTRAVENOUS

## 2016-01-28 MED ORDER — FERUMOXYTOL INJECTION 510 MG/17 ML
510.0000 mg | Freq: Once | INTRAVENOUS | Status: AC
  Administered 2016-01-28: 510 mg via INTRAVENOUS
  Filled 2016-01-28: qty 17

## 2016-01-28 NOTE — Patient Instructions (Signed)

## 2016-01-28 NOTE — Telephone Encounter (Addendum)
I spoke w Jeneen Rinks today w [place] and he advised [place] for [facility].   [id number]    P: [phone]   Dx: D50.9

## 2016-02-04 ENCOUNTER — Ambulatory Visit: Payer: BLUE CROSS/BLUE SHIELD

## 2016-02-04 ENCOUNTER — Other Ambulatory Visit: Payer: Self-pay | Admitting: Gastroenterology

## 2016-02-05 ENCOUNTER — Ambulatory Visit (HOSPITAL_BASED_OUTPATIENT_CLINIC_OR_DEPARTMENT_OTHER): Payer: BLUE CROSS/BLUE SHIELD

## 2016-02-05 VITALS — BP 103/74 | HR 81 | Temp 97.9°F | Resp 20

## 2016-02-05 DIAGNOSIS — D509 Iron deficiency anemia, unspecified: Secondary | ICD-10-CM

## 2016-02-05 DIAGNOSIS — K922 Gastrointestinal hemorrhage, unspecified: Secondary | ICD-10-CM

## 2016-02-05 MED ORDER — SODIUM CHLORIDE 0.9 % IJ SOLN
3.0000 mL | Freq: Once | INTRAMUSCULAR | Status: DC | PRN
  Filled 2016-02-05: qty 10

## 2016-02-05 MED ORDER — SODIUM CHLORIDE 0.9 % IV SOLN
510.0000 mg | Freq: Once | INTRAVENOUS | Status: AC
  Administered 2016-02-05: 510 mg via INTRAVENOUS
  Filled 2016-02-05: qty 17

## 2016-02-05 MED ORDER — SODIUM CHLORIDE 0.9 % IV SOLN
Freq: Once | INTRAVENOUS | Status: AC
Start: 2016-02-05 — End: 2016-02-05
  Administered 2016-02-05: 12:00:00 via INTRAVENOUS

## 2016-02-05 MED ORDER — SODIUM CHLORIDE 0.9 % IJ SOLN
10.0000 mL | INTRAMUSCULAR | Status: DC | PRN
  Filled 2016-02-05: qty 10

## 2016-02-05 MED ORDER — ALTEPLASE 2 MG IJ SOLR
2.0000 mg | Freq: Once | INTRAMUSCULAR | Status: DC | PRN
  Filled 2016-02-05: qty 2

## 2016-02-05 MED ORDER — HEPARIN SOD (PORK) LOCK FLUSH 100 UNIT/ML IV SOLN
500.0000 [IU] | Freq: Once | INTRAVENOUS | Status: DC | PRN
  Filled 2016-02-05: qty 5

## 2016-02-05 MED ORDER — HEPARIN SOD (PORK) LOCK FLUSH 100 UNIT/ML IV SOLN
250.0000 [IU] | Freq: Once | INTRAVENOUS | Status: DC | PRN
  Filled 2016-02-05: qty 5

## 2016-02-05 NOTE — Patient Instructions (Signed)

## 2016-02-08 ENCOUNTER — Encounter (HOSPITAL_COMMUNITY): Payer: Self-pay | Admitting: *Deleted

## 2016-02-16 ENCOUNTER — Encounter (HOSPITAL_COMMUNITY)
Admission: RE | Disposition: A | Discharge status: Home / Self Care | Payer: Self-pay | Source: Ambulatory Visit | Attending: Gastroenterology

## 2016-02-16 ENCOUNTER — Ambulatory Visit (HOSPITAL_COMMUNITY): Payer: BLUE CROSS/BLUE SHIELD | Admitting: Anesthesiology

## 2016-02-16 ENCOUNTER — Encounter (HOSPITAL_COMMUNITY): Payer: Self-pay | Admitting: Certified Registered Nurse Anesthetist

## 2016-02-16 ENCOUNTER — Ambulatory Visit (HOSPITAL_COMMUNITY)
Admission: RE | Admit: 2016-02-16 | Discharge: 2016-02-16 | Disposition: A | Discharge status: Home / Self Care | Payer: BLUE CROSS/BLUE SHIELD | Source: Ambulatory Visit | Attending: Gastroenterology | Admitting: Gastroenterology

## 2016-02-16 DIAGNOSIS — K633 Ulcer of intestine: Secondary | ICD-10-CM | POA: Diagnosis not present

## 2016-02-16 DIAGNOSIS — D509 Iron deficiency anemia, unspecified: Secondary | ICD-10-CM | POA: Diagnosis not present

## 2016-02-16 DIAGNOSIS — I471 Supraventricular tachycardia: Secondary | ICD-10-CM | POA: Diagnosis not present

## 2016-02-16 HISTORY — PX: COLONOSCOPY WITH PROPOFOL: SHX5780

## 2016-02-16 HISTORY — DX: Other specified postprocedural states: Z98.890

## 2016-02-16 HISTORY — DX: Adverse effect of unspecified anesthetic, initial encounter: T41.45XA

## 2016-02-16 HISTORY — DX: Other complications of anesthesia, initial encounter: T88.59XA

## 2016-02-16 HISTORY — DX: Cardiac arrhythmia, unspecified: I49.9

## 2016-02-16 HISTORY — PX: ESOPHAGOGASTRODUODENOSCOPY (EGD) WITH PROPOFOL: SHX5813

## 2016-02-16 HISTORY — DX: Other specified postprocedural states: R11.2

## 2016-02-16 SURGERY — COLONOSCOPY WITH PROPOFOL
Anesthesia: Monitor Anesthesia Care

## 2016-02-16 MED ORDER — LACTATED RINGERS IV SOLN
INTRAVENOUS | Status: DC | PRN
  Administered 2016-02-16: 07:00:00 via INTRAVENOUS

## 2016-02-16 MED ORDER — GLYCOPYRROLATE 0.2 MG/ML IJ SOLN
INTRAMUSCULAR | Status: DC | PRN
  Administered 2016-02-16: .2 mg via INTRAVENOUS

## 2016-02-16 MED ORDER — SODIUM CHLORIDE 0.9 % IV SOLN: INTRAVENOUS | Status: DC

## 2016-02-16 MED ORDER — LIDOCAINE HCL (CARDIAC) 20 MG/ML IV SOLN
INTRAVENOUS | Status: DC | PRN
  Administered 2016-02-16: 75 mg via INTRAVENOUS

## 2016-02-16 MED ORDER — PROPOFOL 10 MG/ML IV BOLUS
INTRAVENOUS | Status: AC
  Filled 2016-02-16: qty 40

## 2016-02-16 MED ORDER — PROPOFOL 10 MG/ML IV BOLUS
INTRAVENOUS | Status: DC | PRN
  Administered 2016-02-16: 40 mg via INTRAVENOUS
  Administered 2016-02-16 (×5): 20 mg via INTRAVENOUS
  Administered 2016-02-16: 40 mg via INTRAVENOUS
  Administered 2016-02-16 (×5): 20 mg via INTRAVENOUS

## 2016-02-16 SURGICAL SUPPLY — 24 items

## 2016-02-16 NOTE — Op Note (Signed)
Problem: Unexplained iron deficiency anemia. 11/20/2002 normal screening colonoscopy was performed except for an erosion in the cecum. 2007 normal esophagogastroduodenoscopy with small bowel biopsies performed. 01/14/2010 normal screening colonoscopy was performed. 09/23/2011 CT enterography showed "ileitis" R/O NSAID injury.  Endoscopist: Earle Gell  Premedication: Propofol administered by anesthesia  Procedure: Diagnostic esophagogastroduodenoscopy The patient was placed in the left lateral decubitus position. The Pentax gastroscope was passed through the posterior hypopharynx into the proximal esophagus without difficulty. The hypopharynx, larynx, and vocal cords appeared normal.  Esophagoscopy: The proximal, mid, and lower segments of the esophageal mucosa appeared normal. The squamocolumnar junction was regular in appearance and noted at approximately 35 cm from the incisor teeth. There was no endoscopic evidence for the presence of erosive esophagitis or Barrett's esophagus.  Gastroscopy: Retroflex view of the gastric cardia and fundus was normal. The gastric body, antrum, and pylorus appeared normal.  Duodenoscopy: The duodenal bulb and descending duodenum appeared normal. Four biopsies were taken from the second portion of the duodenum and a biopsy was taken from the distal duodenal bulb to look for villous atrophy associated with celiac disease.  Assessment: Normal esophagogastroduodenoscopy. Screen for celiac disease pending  Procedure: Diagnostic colonoscopy Anal inspection and digital rectal exam were normal. The Pentax pediatric colonoscope was introduced into the rectum and advanced to the cecum. A normal-appearing ileocecal valve was identified. A normal-appearing appendiceal orifice was identified. Colonic preparation for the exam today was good. Withdrawal time was 12 minutes.  Rectum. Normal. Retroflexed view of the distal rectum was normal  Sigmoid colon and descending  colon. Normal  Splenic flexure. Normal  Transverse colon. Normal  Hepatic flexure. Normal  Ascending colon. Normal  Cecum and ileocecal valve. A shallow erosion on the ileocecal valve was biopsied. I was unable to intubate the ileocecal valve and examine the terminal ileum.  Assessment: Normal diagnostic colonoscopy except for a shallow erosion on the lip of the ileocecal valve which was biopsied.  Recommendation: Schedule barium small bowel follow-through x-ray series to evaluate the distal ileum for Crohn's ileitis.

## 2016-02-16 NOTE — H&P (Signed)
  Problem: Iron deficiency anemia. 11/20/2002 normal screening colonoscopy was performed except for erosions in the cecum. 2007 normal esophagogastroduodenoscopy with small bowel biopsies performed. 01/14/2006 normal screening colonoscopy was performed. 09/23/2011 CT enterography showed "ileitis".  History: The patient is a 55 year old female born [birth year]. She has unexplained iron deficiency anemia. She denies hemoptysis, hematuria, hematochezia, or passage of melenic-appearing stool. She no longer takes nonsteroidal anti-inflammatory medication.  She is scheduled to undergo diagnostic esophagogastroduodenoscopy, small bowel biopsies to look for celiac disease, and diagnostic colonoscopy.  Medication allergies: Penicillin  Past medical history: Chronic headaches. Anxiety with depression. Laparoscopic appendectomy. Tonsillectomy. Bilateral inguinal herniorrhaphies. Benign breast biopsies.  Exam: The patient is alert and lying comfortably on the endoscopy stretcher. Abdomen is soft and nontender to palpation. Lungs are clear to auscultation. Cardiac exam reveals a regular rhythm.  Plan: Proceed with diagnostic esophagogastroduodenoscopy and colonoscopy to evaluate unexplained iron deficiency anemia.

## 2016-02-16 NOTE — Transfer of Care (Signed)
Immediate Anesthesia Transfer of Care Note  Patient: Carolyn Chaney  Procedure(s) Performed: Procedure(s): COLONOSCOPY WITH PROPOFOL (N/A) ESOPHAGOGASTRODUODENOSCOPY (EGD) WITH PROPOFOL (N/A)  Patient Location: PACU and Endoscopy Unit  Anesthesia Type:MAC  Level of Consciousness: awake, oriented, patient cooperative and responds to stimulation  Airway & Oxygen Therapy: Patient Spontanous Breathing and Patient connected to nasal cannula oxygen  Post-op Assessment: Report given to RN, Post -op Vital signs reviewed and stable and Patient moving all extremities  Post vital signs: Reviewed and stable  Last Vitals:  Filed Vitals:   02/16/16 0717  BP: 137/78  Pulse: 66  Temp: 36.7 C  Resp: 10    Complications: No apparent anesthesia complications

## 2016-02-16 NOTE — Anesthesia Preprocedure Evaluation (Signed)
Anesthesia Evaluation  Patient identified by MRN, date of birth, ID band Patient awake    Reviewed: Allergy & Precautions, NPO status , Patient's Chart, lab work & pertinent test results, reviewed documented beta blocker date and time   History of Anesthesia Complications (+) PONV and history of anesthetic complications  Airway Mallampati: II  TM Distance: >3 FB Neck ROM: Full    Dental  (+) Dental Advisory Given, Caps, Implants   Pulmonary neg pulmonary ROS,    Pulmonary exam normal breath sounds clear to auscultation       Cardiovascular Normal cardiovascular exam+ dysrhythmias (atrial tachycardia on metoprolol)  Rhythm:Regular Rate:Normal     Neuro/Psych PSYCHIATRIC DISORDERS Anxiety negative neurological ROS     GI/Hepatic Neg liver ROS, PUD,   Endo/Other  negative endocrine ROS  Renal/GU negative Renal ROS     Musculoskeletal negative musculoskeletal ROS (+)   Abdominal   Peds  Hematology  (+) Blood dyscrasia, anemia ,   Anesthesia Other Findings Day of surgery medications reviewed with the patient.  Reproductive/Obstetrics                             Anesthesia Physical Anesthesia Plan  ASA: II  Anesthesia Plan: MAC   Post-op Pain Management:    Induction: Intravenous  Airway Management Planned: Nasal Cannula  Additional Equipment:   Intra-op Plan:   Post-operative Plan:   Informed Consent: I have reviewed the patients History and Physical, chart, labs and discussed the procedure including the risks, benefits and alternatives for the proposed anesthesia with the patient or authorized representative who has indicated his/her understanding and acceptance.   Dental advisory given  Plan Discussed with: CRNA and Anesthesiologist  Anesthesia Plan Comments: (Discussed risks/benefits/alternatives to MAC sedation including need for ventilatory support, hypotension, need for  conversion to general anesthesia.  All patient questions answered.  Patient wished to proceed.)        Anesthesia Quick Evaluation

## 2016-02-16 NOTE — Discharge Instructions (Signed)
Colonoscopy, Care After °Refer to this sheet in the next few weeks. These instructions provide you with information on caring for yourself after your procedure. Your health care provider may also give you more specific instructions. Your treatment has been planned according to current medical practices, but problems sometimes occur. Call your health care provider if you have any problems or questions after your procedure. °WHAT TO EXPECT AFTER THE PROCEDURE  °After your procedure, it is typical to have the following: °· A small amount of blood in your stool. °· Moderate amounts of gas and mild abdominal cramping or bloating. °HOME CARE INSTRUCTIONS °· Do not drive, operate machinery, or sign important documents for 24 hours. °· You may shower and resume your regular physical activities, but move at a slower pace for the first 24 hours. °· Take frequent rest periods for the first 24 hours. °· Walk around or put a warm pack on your abdomen to help reduce abdominal cramping and bloating. °· Drink enough fluids to keep your urine clear or pale yellow. °· You may resume your normal diet as instructed by your health care provider. Avoid heavy or fried foods that are hard to digest. °· Avoid drinking alcohol for 24 hours or as instructed by your health care provider. °· Only take over-the-counter or prescription medicines as directed by your health care provider. °· If a tissue sample (biopsy) was taken during your procedure: °¨ Do not take aspirin or blood thinners for 7 days, or as instructed by your health care provider. °¨ Do not drink alcohol for 7 days, or as instructed by your health care provider. °¨ Eat soft foods for the first 24 hours. °SEEK MEDICAL CARE IF: °You have persistent spotting of blood in your stool 2-3 days after the procedure. °SEEK IMMEDIATE MEDICAL CARE IF: °· You have more than a small spotting of blood in your stool. °· You pass large blood clots in your stool. °· Your abdomen is swollen  (distended). °· You have nausea or vomiting. °· You have a fever. °· You have increasing abdominal pain that is not relieved with medicine. °  °This information is not intended to replace advice given to you by your health care provider. Make sure you discuss any questions you have with your health care provider. °  °Document Released: 07/12/2004 Document Revised: 09/18/2013 Document Reviewed: 08/05/2013 °Elsevier Interactive Patient Education ©2016 [employer] ° °

## 2016-02-16 NOTE — Anesthesia Postprocedure Evaluation (Signed)
Anesthesia Post Note  Patient: Carolyn Chaney  Procedure(s) Performed: Procedure(s) (LRB): COLONOSCOPY WITH PROPOFOL (N/A) ESOPHAGOGASTRODUODENOSCOPY (EGD) WITH PROPOFOL (N/A)  Patient location during evaluation: Endoscopy Anesthesia Type: MAC Level of consciousness: awake and alert Pain management: pain level controlled Vital Signs Assessment: post-procedure vital signs reviewed and stable Respiratory status: spontaneous breathing, nonlabored ventilation, respiratory function stable and patient connected to nasal cannula oxygen Cardiovascular status: stable and blood pressure returned to baseline Anesthetic complications: no    Last Vitals:  Filed Vitals:   02/16/16 0717 02/16/16 0848  BP: 137/78 103/64  Pulse: 66 67  Temp: 36.7 C 37 C  Resp: 10 10    Last Pain: There were no vitals filed for this visit.               Catalina Gravel

## 2016-02-17 ENCOUNTER — Encounter (HOSPITAL_COMMUNITY): Payer: Self-pay | Admitting: Gastroenterology

## 2016-03-01 ENCOUNTER — Ambulatory Visit (INDEPENDENT_AMBULATORY_CARE_PROVIDER_SITE_OTHER): Payer: BLUE CROSS/BLUE SHIELD | Admitting: Internal Medicine

## 2016-03-01 ENCOUNTER — Encounter: Payer: Self-pay | Admitting: Internal Medicine

## 2016-03-01 VITALS — BP 120/75 | HR 62 | Ht 66.0 in | Wt 141.2 lb

## 2016-03-01 DIAGNOSIS — I471 Supraventricular tachycardia: Secondary | ICD-10-CM

## 2016-03-01 MED ORDER — METOPROLOL SUCCINATE ER 25 MG PO TB24: 25.0000 mg | ORAL_TABLET | Freq: Every day | ORAL | Status: DC

## 2016-03-01 NOTE — Progress Notes (Signed)
       Patient Care Team: [name], MD as PCP - General (Internal Medicine)   HPI  Carolyn Chaney is a 55 y.o. female Seen in follow-up for palpitations associated with atrial tachycardia. She is seeing Dr. Lovena Le for consideration of ablation and elected to pursue therapy with flecainide  theat was forsaken and she now takes low dose metoprolol with great benefit  She had concern about coronary disease and a half of her calcium scoring; was never completed.  Records and Results Reviewed hospital records from recent endoscopy undertaken for anemia  Past Medical History  Diagnosis Date  . Iron deficiency anemia 11/07/2011  . Irregular heart rate   . Gastric ulcer   . Chronic GI bleeding 01/27/2016  . Complication of anesthesia   . PONV (postoperative nausea and vomiting)   . Dysrhythmia     PVC,s, atrial tachycardia    Past Surgical History  Procedure Laterality Date  . Appendectomy    . Tonsillectomy    . Breast surgery    . Hernia repair      bilateral inguinal hernia as child  . Colonoscopy with propofol N/A 02/16/2016    Procedure: COLONOSCOPY WITH PROPOFOL;  Surgeon: Garlan Fair, MD;  Location: [facility];  Service: Endoscopy;  Laterality: N/A;  . Esophagogastroduodenoscopy (egd) with propofol N/A 02/16/2016    Procedure: ESOPHAGOGASTRODUODENOSCOPY (EGD) WITH PROPOFOL;  Surgeon: Garlan Fair, MD;  Location: [facility];  Service: Endoscopy;  Laterality: N/A;    Current Outpatient Prescriptions  Medication Sig Dispense Refill  . acetaminophen (TYLENOL) 500 MG tablet Take 500 mg by mouth every 6 (six) hours as needed for mild pain.    Marland Kitchen ALPRAZolam (XANAX) 0.5 MG tablet Take 0.25 mg by mouth daily as needed for anxiety.     Marland Kitchen ascorbic acid (VITAMIN C) 500 MG tablet Take 500 mg by mouth daily.    . cholecalciferol (VITAMIN D) 1000 units tablet Take 1,000 Units by mouth daily.    . metoprolol succinate (TOPROL-XL) 25 MG 24 hr tablet Take 0.5 tablets (12.5  mg total) by mouth daily. (Patient taking differently: Take 12.5 mg by mouth at bedtime. ) 90 tablet 3  . sertraline (ZOLOFT) 100 MG tablet Take 100 mg by mouth daily.    . vitamin B-12 (CYANOCOBALAMIN) 1000 MCG tablet Take 1,000 mcg by mouth daily.     No current facility-administered medications for this visit.    Allergies  Allergen Reactions  . Nsaids Other (See Comments)    Bleeding      Review of Systems negative except from HPI and PMH  Physical Exam BP 120/75 mmHg  Pulse 62  Ht 5' 6"  (1.676 m)  Wt 141 lb 3.2 oz (64.048 kg)  BMI 22.80 kg/m2 Well developed and well nourished in no acute distress HENT normal E scleral and icterus clear Neck Supple JVP flat; carotids brisk and full Clear to ausculation  Regular rate and rhythm, no murmurs gallops or rub Soft with active bowel sounds No clubbing cyanosis  Edema Alert and oriented, grossly normal motor and sensory function Skin Warm and Dry  ECG: Sinus Rhythm  @64             Intervals  17/09/39  Axis 74    Assessment and  Plan  Atrial tachy  Reasonably controlled on metoprolol with occasional breaktrhough Will increase to 25 daily

## 2016-03-01 NOTE — Patient Instructions (Signed)
Medication Instructions: 1) Increase toprol (metoprolol succinate) to 25 mg one whole tablet by mouth once daily  Labwork: - none  Procedures/Testing: - none  Follow-Up: - Your physician wants you to follow-up in: 2 years with Dr. Caryl Comes. You will receive a reminder letter in the mail two months in advance. If you don't receive a letter, please call our office to schedule the follow-up appointment.  Any Additional Special Instructions Will Be Listed Below (If Applicable).     If you need a refill on your cardiac medications before your next appointment, please call your pharmacy.

## 2016-03-04 ENCOUNTER — Encounter: Payer: Self-pay | Admitting: Family

## 2016-03-04 ENCOUNTER — Other Ambulatory Visit (HOSPITAL_BASED_OUTPATIENT_CLINIC_OR_DEPARTMENT_OTHER): Payer: BLUE CROSS/BLUE SHIELD

## 2016-03-04 ENCOUNTER — Ambulatory Visit (HOSPITAL_BASED_OUTPATIENT_CLINIC_OR_DEPARTMENT_OTHER): Payer: BLUE CROSS/BLUE SHIELD | Admitting: Family

## 2016-03-04 VITALS — BP 99/60 | HR 74 | Temp 97.7°F | Resp 16 | Ht 66.0 in | Wt 142.0 lb

## 2016-03-04 DIAGNOSIS — K922 Gastrointestinal hemorrhage, unspecified: Secondary | ICD-10-CM

## 2016-03-04 DIAGNOSIS — D509 Iron deficiency anemia, unspecified: Secondary | ICD-10-CM | POA: Diagnosis not present

## 2016-03-04 DIAGNOSIS — D500 Iron deficiency anemia secondary to blood loss (chronic): Secondary | ICD-10-CM | POA: Diagnosis not present

## 2016-03-04 LAB — CBC WITH DIFFERENTIAL (CANCER CENTER ONLY)
BASO#: 0 10*3/uL (ref 0.0–0.2)
BASO%: 0.9 % (ref 0.0–2.0)
EOS ABS: 0.1 10*3/uL (ref 0.0–0.5)
EOS%: 2.3 % (ref 0.0–7.0)
HEMATOCRIT: 36.3 % (ref 34.8–46.6)
HEMOGLOBIN: 11.8 g/dL (ref 11.6–15.9)
LYMPH#: 1.1 10*3/uL (ref 0.9–3.3)
LYMPH%: 24.8 % (ref 14.0–48.0)
MCH: 30.6 pg (ref 26.0–34.0)
MCHC: 32.5 g/dL (ref 32.0–36.0)
MCV: 94 fL (ref 81–101)
MONO#: 0.3 10*3/uL (ref 0.1–0.9)
MONO%: 6.3 % (ref 0.0–13.0)
NEUT%: 65.7 % (ref 39.6–80.0)
NEUTROS ABS: 2.9 10*3/uL (ref 1.5–6.5)
Platelets: 243 10*3/uL (ref 145–400)
RBC: 3.85 10*6/uL (ref 3.70–5.32)
RDW: 17 % — ABNORMAL HIGH (ref 11.1–15.7)
WBC: 4.4 10*3/uL (ref 3.9–10.0)

## 2016-03-04 NOTE — Progress Notes (Signed)
Hematology and Oncology Follow Up Visit  Carolyn Chaney [id number] 1961-06-07 55 y.o. 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 is here today for a follow-up. She states that she followed up with GI last month and had both an endoscopy and colonoscopy with 2 non bleeding ulcers found in the small and large intestine. She states that her GI doctor feels that she is not a good candidate for the camera pill due to narrowing or the small intestine. He is afraid it may get stuck. She is now waiting to schedule a barium study.  She feels much better since receiving 2 doses of Feraheme in February. Her iron saturation then was 5% with a ferritin of 4. She is asymptomatic at this time.  No fever, chills, n/v, cough, rash, ice cravings, dizziness, SOB, chest pain, palpitations, abdominal pain or changes in bowel or bladder habits.  No swelling, tenderness, numbness or tinging in her extremities. No c/o new joint aches or pains.  She has maintained a good appetite and is staying well hydrated. Her weight is stable.   Medications:    Medication List       This list is accurate as of: 03/04/16  4:06 PM.  Always use your most recent med list.               acetaminophen 500 MG tablet  Commonly known as:  TYLENOL  Take 500 mg by mouth every 6 (six) hours as needed for mild pain.     ALPRAZolam 0.5 MG tablet  Commonly known as:  XANAX  Take 0.25 mg by mouth daily as needed for anxiety.     ascorbic acid 500 MG tablet  Commonly known as:  VITAMIN C  Take 500 mg by mouth daily.     cholecalciferol 1000 units tablet  Commonly known as:  VITAMIN D  Take 1,000 Units by mouth daily.     metoprolol succinate 25 MG 24 hr tablet  Commonly known as:  TOPROL-XL  Take 1 tablet (25 mg total) by mouth daily.     sertraline 100 MG tablet  Commonly known as:   ZOLOFT  Take 100 mg by mouth daily.     vitamin B-12 1000 MCG tablet  Commonly known as:  CYANOCOBALAMIN  Take 1,000 mcg by mouth daily.        Allergies:  Allergies  Allergen Reactions  . Nsaids Other (See Comments)    Bleeding    Past Medical History, Surgical history, Social history, and Family History were reviewed and updated.  Review of Systems: All other 10 point review of systems is negative.   Physical Exam:  height is 5' 6"  (1.676 m) and weight is 142 lb (64.411 kg). Her oral temperature is 97.7 F (36.5 C). Her blood pressure is 99/60 and her pulse is 74. Her respiration is 16.   Wt Readings from Last 3 Encounters:  03/04/16 142 lb (64.411 kg)  03/01/16 141 lb 3.2 oz (64.048 kg)  02/16/16 143 lb (64.864 kg)    Ocular: Sclerae unicteric, pupils equal, round and reactive to light Ear-nose-throat: Oropharynx clear, dentition fair Lymphatic: No cervical supraclavicular or axillary adenopathy Lungs no rales or rhonchi, good excursion bilaterally Heart regular rate and rhythm, no murmur appreciated Abd soft, nontender, positive bowel sounds, no liver or spleen tip palpated on exam,  no fluid wave MSK no focal spinal tenderness, no joint edema Neuro: non-focal, well-oriented, appropriate affect Breasts: Deferred  Lab Results  Component Value Date   WBC 4.4 03/04/2016   HGB 11.8 03/04/2016   HCT 36.3 03/04/2016   MCV 94 03/04/2016   PLT 243 03/04/2016   Lab Results  Component Value Date   FERRITIN 4* 01/27/2016   IRON 21* 01/27/2016   TIBC 423 01/27/2016   UIBC 402* 01/27/2016   IRONPCTSAT 5* 01/27/2016   Lab Results  Component Value Date   RETICCTPCT 0.7 05/30/2012   RBC 3.85 03/04/2016   RETICCTABS 29.1 05/30/2012   No results found for: KPAFRELGTCHN, LAMBDASER, KAPLAMBRATIO No results found for: IGGSERUM, IGA, IGMSERUM No results found for: Ronnald Ramp, A1GS, A2GS, Tillman Sers, SPEI   Chemistry      Component  Value Date/Time   NA 141 06/22/2013 1538   K 4.2 06/22/2013 1538   CL 104 06/22/2013 1538   CO2 29 06/22/2013 1538   BUN 12 06/22/2013 1538   CREATININE 0.80 06/22/2013 1538      Component Value Date/Time   CALCIUM 9.9 06/22/2013 1538     Impression and Plan: [name] is a very pleasant 55 yo white female with history of iron deficiency secondary to chronic blood loss due to GI bleed. She is being followed by GI and is waiting to schedule a barium study. She has had a nice response to [facility] and is asymptomatic at this time.  Her CBC today looks good. We will see what her iron studies show and bring her in next week for an infusion if needed.  We will plan to see her back in 2 months for labs and follow-up.  She will contact us with any questions or concerns. We can certainly see her sooner if need be.   Eliezer Bottom, NP 3/24/20174:06 PM

## 2016-03-05 LAB — RETICULOCYTES: RETICULOCYTE COUNT: 0.9 % (ref 0.6–2.6)

## 2016-03-07 LAB — IRON AND TIBC
%SAT: 24 % (ref 21–57)
Iron: 72 ug/dL (ref 41–142)
TIBC: 296 ug/dL (ref 236–444)
UIBC: 224 ug/dL (ref 120–384)

## 2016-03-07 LAB — FERRITIN: Ferritin: 100 ng/ml (ref 9–269)

## 2016-03-08 ENCOUNTER — Encounter: Payer: Self-pay | Admitting: *Deleted

## 2016-03-26 ENCOUNTER — Other Ambulatory Visit: Payer: Self-pay | Admitting: Internal Medicine

## 2016-05-06 ENCOUNTER — Other Ambulatory Visit: Payer: BLUE CROSS/BLUE SHIELD

## 2016-05-06 ENCOUNTER — Ambulatory Visit: Payer: BLUE CROSS/BLUE SHIELD | Admitting: Family

## 2016-05-11 ENCOUNTER — Ambulatory Visit (HOSPITAL_BASED_OUTPATIENT_CLINIC_OR_DEPARTMENT_OTHER): Payer: BLUE CROSS/BLUE SHIELD | Admitting: Family

## 2016-05-11 ENCOUNTER — Encounter: Payer: Self-pay | Admitting: Family

## 2016-05-11 ENCOUNTER — Other Ambulatory Visit (HOSPITAL_BASED_OUTPATIENT_CLINIC_OR_DEPARTMENT_OTHER): Payer: BLUE CROSS/BLUE SHIELD

## 2016-05-11 VITALS — BP 108/70 | HR 70 | Temp 98.2°F | Resp 18 | Ht 66.0 in | Wt 145.0 lb

## 2016-05-11 DIAGNOSIS — D500 Iron deficiency anemia secondary to blood loss (chronic): Secondary | ICD-10-CM

## 2016-05-11 DIAGNOSIS — D509 Iron deficiency anemia, unspecified: Secondary | ICD-10-CM

## 2016-05-11 DIAGNOSIS — K922 Gastrointestinal hemorrhage, unspecified: Secondary | ICD-10-CM

## 2016-05-11 LAB — CBC WITH DIFFERENTIAL (CANCER CENTER ONLY)
BASO#: 0 10*3/uL (ref 0.0–0.2)
BASO%: 0.6 % (ref 0.0–2.0)
EOS%: 2.5 % (ref 0.0–7.0)
Eosinophils Absolute: 0.1 10*3/uL (ref 0.0–0.5)
HEMATOCRIT: 37.5 % (ref 34.8–46.6)
HEMOGLOBIN: 12.2 g/dL (ref 11.6–15.9)
LYMPH#: 1.3 10*3/uL (ref 0.9–3.3)
LYMPH%: 27.8 % (ref 14.0–48.0)
MCH: 30.9 pg (ref 26.0–34.0)
MCHC: 32.5 g/dL (ref 32.0–36.0)
MCV: 95 fL (ref 81–101)
MONO#: 0.4 10*3/uL (ref 0.1–0.9)
MONO%: 7.5 % (ref 0.0–13.0)
NEUT%: 61.6 % (ref 39.6–80.0)
NEUTROS ABS: 2.9 10*3/uL (ref 1.5–6.5)
Platelets: 245 10*3/uL (ref 145–400)
RBC: 3.95 10*6/uL (ref 3.70–5.32)
RDW: 14.3 % (ref 11.1–15.7)
WBC: 4.8 10*3/uL (ref 3.9–10.0)

## 2016-05-11 NOTE — Progress Notes (Signed)
Hematology and Oncology Follow Up Visit  Carolyn Chaney [id number] Oct 30, 1961 55 y.o. 05/11/2016   Principle Diagnosis:  Iron deficiency anemia secondary to chronic blood loss due to intermittent GI bleed  Current Therapy:   IV iron as indicated     Interim History:  Carolyn Chaney is here today for a follow-up. She is doing quite well and has no complaints at this time. 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  She received iron in February and has had a nice response. Her iron saturation in March was up to 24% with a ferritin of 100. She is asymptomatic at this time.  No fever, chills, n/v, cough, rash, ice cravings, dizziness, SOB, chest pain, palpitations, abdominal pain or changes in bowel or bladder habits.  No swelling, tenderness, numbness or tinging in her extremities. No c/o new joint aches or pains.  She has maintained a good appetite and is staying well hydrated. Her weight is stable.   Medications:    Medication List       This list is accurate as of: 05/11/16 11:59 AM.  Always use your most recent med list.               acetaminophen 500 MG tablet  Commonly known as:  TYLENOL  Take 500 mg by mouth every 6 (six) hours as needed for mild pain.     ALPRAZolam 0.5 MG tablet  Commonly known as:  XANAX  Take 0.25 mg by mouth daily as needed for anxiety.     ascorbic acid 500 MG tablet  Commonly known as:  VITAMIN C  Take 500 mg by mouth daily.     cholecalciferol 1000 units tablet  Commonly known as:  VITAMIN D  Take 1,000 Units by mouth daily.     metoprolol succinate 25 MG 24 hr tablet  Commonly known as:  TOPROL-XL  Take 1 tablet (25 mg total) by mouth daily.     oxybutynin 10 MG 24 hr tablet  Commonly known as:  DITROPAN-XL  TAKE 1 TABLET BY MOUTH EVERY DAY AS NEEDED FOR URINARY URGENCY     sertraline 100 MG tablet  Commonly known as:  ZOLOFT  Take 100 mg by mouth daily.     vitamin B-12 1000 MCG tablet  Commonly known as:   CYANOCOBALAMIN  Take 1,000 mcg by mouth daily.        Allergies:  Allergies  Allergen Reactions  . Nsaids Other (See Comments)    Bleeding    Past Medical History, Surgical history, Social history, and Family History were reviewed and updated.  Review of Systems: All other 10 point review of systems is negative.   Physical Exam:  height is 5' 6"  (1.676 m) and weight is 145 lb (65.772 kg). Her oral temperature is 98.2 F (36.8 C). Her blood pressure is 108/70 and her pulse is 70. Her respiration is 18.   Wt Readings from Last 3 Encounters:  05/11/16 145 lb (65.772 kg)  03/04/16 142 lb (64.411 kg)  03/01/16 141 lb 3.2 oz (64.048 kg)    Ocular: Sclerae unicteric, pupils equal, round and reactive to light Ear-nose-throat: Oropharynx clear, dentition fair Lymphatic: No cervical supraclavicular or axillary adenopathy Lungs no rales or rhonchi, good excursion bilaterally Heart regular rate and rhythm, no murmur appreciated Abd soft, nontender, positive bowel sounds, no liver or spleen tip palpated on exam, no fluid wave MSK no focal spinal tenderness, no joint edema Neuro: non-focal, well-oriented, appropriate affect Breasts:  Deferred  Lab Results  Component Value Date   WBC 4.8 05/11/2016   HGB 12.2 05/11/2016   HCT 37.5 05/11/2016   MCV 95 05/11/2016   PLT 245 05/11/2016   Lab Results  Component Value Date   FERRITIN 100 03/04/2016   IRON 72 03/04/2016   TIBC 296 03/04/2016   UIBC 224 03/04/2016   IRONPCTSAT 24 03/04/2016   Lab Results  Component Value Date   RETICCTPCT 0.7 05/30/2012   RBC 3.95 05/11/2016   RETICCTABS 29.1 05/30/2012   No results found for: KPAFRELGTCHN, LAMBDASER, KAPLAMBRATIO No results found for: IGGSERUM, IGA, IGMSERUM No results found for: Ronnald Ramp, A1GS, A2GS, Tillman Sers, SPEI   Chemistry      Component Value Date/Time   NA 141 06/22/2013 1538   K 4.2 06/22/2013 1538   CL 104 06/22/2013 1538    CO2 29 06/22/2013 1538   BUN 12 06/22/2013 1538   CREATININE 0.80 06/22/2013 1538      Component Value Date/Time   CALCIUM 9.9 06/22/2013 1538     Impression and Plan: [name] is a very pleasant 55 yo white female with history of iron deficiency secondary to chronic blood loss due to GI bleed. She received iron in February and has had a nice response. She is asymptomatic at this time.  Her CBC today looks good. Hgb is stable at 12.2 with an MCV of 95.  We will see what her iron studies show and rbing her back in later this week for an infusion if needed.  We will plan to see her back in 3 months for repeat labs and follow-up.  She will contact us with any questions or concerns. We can certainly see her sooner if need be.   Eliezer Bottom, NP 5/31/201711:59 AM

## 2016-05-12 LAB — IRON AND TIBC
%SAT: 25 % (ref 21–57)
Iron: 81 ug/dL (ref 41–142)
TIBC: 323 ug/dL (ref 236–444)
UIBC: 242 ug/dL (ref 120–384)

## 2016-05-12 LAB — FERRITIN: Ferritin: 15 ng/ml (ref 9–269)

## 2016-05-12 LAB — RETICULOCYTES: RETICULOCYTE COUNT: 1 % (ref 0.6–2.6)

## 2016-05-13 ENCOUNTER — Telehealth: Payer: Self-pay | Admitting: Family

## 2016-05-13 NOTE — Telephone Encounter (Signed)
Error

## 2016-05-16 ENCOUNTER — Other Ambulatory Visit: Payer: Self-pay | Admitting: *Deleted

## 2016-05-16 ENCOUNTER — Telehealth: Payer: Self-pay | Admitting: *Deleted

## 2016-05-16 DIAGNOSIS — D509 Iron deficiency anemia, unspecified: Secondary | ICD-10-CM

## 2016-05-16 NOTE — Telephone Encounter (Addendum)
Patient aware of results. Appointment made.   ----- Message from Eliezer Bottom, NP sent at 05/13/2016  3:53 PM EDT ----- Regarding: Iron IFerritin is low. She will need one dose of iron next week please. Thank you!  Sarah   ----- Message -----    From: [facility]    Sent: 05/11/2016  11:55 AM      To: Eliezer Bottom, NP

## 2016-05-19 ENCOUNTER — Ambulatory Visit (HOSPITAL_BASED_OUTPATIENT_CLINIC_OR_DEPARTMENT_OTHER): Payer: BLUE CROSS/BLUE SHIELD

## 2016-05-19 VITALS — BP 102/64 | HR 67 | Temp 97.5°F | Resp 18

## 2016-05-19 DIAGNOSIS — D509 Iron deficiency anemia, unspecified: Secondary | ICD-10-CM

## 2016-05-19 MED ORDER — SODIUM CHLORIDE 0.9 % IV SOLN
INTRAVENOUS | Status: DC
  Administered 2016-05-19: 15:00:00 via INTRAVENOUS

## 2016-05-19 MED ORDER — SODIUM CHLORIDE 0.9 % IV SOLN
510.0000 mg | Freq: Once | INTRAVENOUS | Status: AC
  Administered 2016-05-19: 510 mg via INTRAVENOUS
  Filled 2016-05-19: qty 17

## 2016-05-19 NOTE — Patient Instructions (Signed)

## 2016-08-11 ENCOUNTER — Ambulatory Visit (HOSPITAL_BASED_OUTPATIENT_CLINIC_OR_DEPARTMENT_OTHER): Payer: BLUE CROSS/BLUE SHIELD | Admitting: Family

## 2016-08-11 ENCOUNTER — Encounter: Payer: Self-pay | Admitting: Family

## 2016-08-11 ENCOUNTER — Other Ambulatory Visit (HOSPITAL_BASED_OUTPATIENT_CLINIC_OR_DEPARTMENT_OTHER): Payer: BLUE CROSS/BLUE SHIELD

## 2016-08-11 VITALS — BP 118/62 | HR 73 | Temp 97.8°F | Resp 16 | Ht 66.0 in | Wt 146.0 lb

## 2016-08-11 DIAGNOSIS — D509 Iron deficiency anemia, unspecified: Secondary | ICD-10-CM

## 2016-08-11 DIAGNOSIS — K922 Gastrointestinal hemorrhage, unspecified: Secondary | ICD-10-CM

## 2016-08-11 DIAGNOSIS — D500 Iron deficiency anemia secondary to blood loss (chronic): Secondary | ICD-10-CM

## 2016-08-11 LAB — CBC WITH DIFFERENTIAL (CANCER CENTER ONLY)
BASO#: 0 10*3/uL (ref 0.0–0.2)
BASO%: 0.5 % (ref 0.0–2.0)
EOS%: 2.2 % (ref 0.0–7.0)
Eosinophils Absolute: 0.1 10*3/uL (ref 0.0–0.5)
HEMATOCRIT: 39.9 % (ref 34.8–46.6)
HGB: 13.3 g/dL (ref 11.6–15.9)
LYMPH#: 1.3 10*3/uL (ref 0.9–3.3)
LYMPH%: 20.1 % (ref 14.0–48.0)
MCH: 31.9 pg (ref 26.0–34.0)
MCHC: 33.3 g/dL (ref 32.0–36.0)
MCV: 96 fL (ref 81–101)
MONO#: 0.4 10*3/uL (ref 0.1–0.9)
MONO%: 5.7 % (ref 0.0–13.0)
NEUT#: 4.6 10*3/uL (ref 1.5–6.5)
NEUT%: 71.5 % (ref 39.6–80.0)
PLATELETS: 224 10*3/uL (ref 145–400)
RBC: 4.17 10*6/uL (ref 3.70–5.32)
RDW: 12.5 % (ref 11.1–15.7)
WBC: 6.4 10*3/uL (ref 3.9–10.0)

## 2016-08-11 LAB — FERRITIN: Ferritin: 70 ng/ml (ref 9–269)

## 2016-08-11 LAB — IRON AND TIBC
%SAT: 30 % (ref 21–57)
Iron: 86 ug/dL (ref 41–142)
TIBC: 289 ug/dL (ref 236–444)
UIBC: 203 ug/dL (ref 120–384)
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
## 2016-08-12 LAB — RETICULOCYTES: Reticulocyte Count: 0.7 % (ref 0.6–2.6)

## 2016-11-09 ENCOUNTER — Ambulatory Visit: Payer: BLUE CROSS/BLUE SHIELD | Admitting: Family

## 2016-11-09 ENCOUNTER — Other Ambulatory Visit: Payer: BLUE CROSS/BLUE SHIELD

## 2017-02-02 ENCOUNTER — Other Ambulatory Visit: Payer: Self-pay | Admitting: Gynecology

## 2017-02-02 DIAGNOSIS — Z1231 Encounter for screening mammogram for malignant neoplasm of breast: Secondary | ICD-10-CM

## 2017-02-22 ENCOUNTER — Ambulatory Visit: Payer: BLUE CROSS/BLUE SHIELD

## 2017-03-08 ENCOUNTER — Ambulatory Visit: Payer: BLUE CROSS/BLUE SHIELD | Admitting: Hematology & Oncology

## 2017-03-08 ENCOUNTER — Encounter: Payer: Self-pay | Admitting: *Deleted

## 2017-03-08 ENCOUNTER — Other Ambulatory Visit (HOSPITAL_BASED_OUTPATIENT_CLINIC_OR_DEPARTMENT_OTHER): Payer: BLUE CROSS/BLUE SHIELD

## 2017-03-08 DIAGNOSIS — D509 Iron deficiency anemia, unspecified: Secondary | ICD-10-CM | POA: Diagnosis not present

## 2017-03-08 LAB — CBC & DIFF AND RETIC
BASO%: 0.7 % (ref 0.0–2.0)
BASOS ABS: 0 10*3/uL (ref 0.0–0.1)
EOS ABS: 0.1 10*3/uL (ref 0.0–0.5)
EOS%: 1.2 % (ref 0.0–7.0)
HEMATOCRIT: 38.5 % (ref 34.8–46.6)
HEMOGLOBIN: 12.8 g/dL (ref 11.6–15.9)
Immature Retic Fract: 2.8 % (ref 1.60–10.00)
LYMPH#: 1.4 10*3/uL (ref 0.9–3.3)
LYMPH%: 32.6 % (ref 14.0–49.7)
MCH: 31.1 pg (ref 25.1–34.0)
MCHC: 33.2 g/dL (ref 31.5–36.0)
MCV: 93.4 fL (ref 79.5–101.0)
MONO#: 0.3 10*3/uL (ref 0.1–0.9)
MONO%: 7.2 % (ref 0.0–14.0)
NEUT#: 2.5 10*3/uL (ref 1.5–6.5)
NEUT%: 58.3 % (ref 38.4–76.8)
PLATELETS: 230 10*3/uL (ref 145–400)
RBC: 4.12 10*6/uL (ref 3.70–5.45)
RDW: 12.4 % (ref 11.2–14.5)
RETIC %: 0.71 % (ref 0.70–2.10)
RETIC CT ABS: 29.25 10*3/uL — AB (ref 33.70–90.70)
WBC: 4.3 10*3/uL (ref 3.9–10.3)

## 2017-03-08 LAB — IRON AND TIBC
%SAT: 43 % (ref 21–57)
Iron: 118 ug/dL (ref 41–142)
TIBC: 278 ug/dL (ref 236–444)
UIBC: 159 ug/dL (ref 120–384)

## 2017-03-08 LAB — FERRITIN: Ferritin: 58 ng/ml (ref 9–269)

## 2017-03-09 ENCOUNTER — Ambulatory Visit: Payer: BLUE CROSS/BLUE SHIELD | Admitting: Family

## 2017-03-14 ENCOUNTER — Other Ambulatory Visit: Payer: Self-pay | Admitting: Internal Medicine

## 2017-03-29 ENCOUNTER — Ambulatory Visit: Payer: BLUE CROSS/BLUE SHIELD

## 2017-04-15 ENCOUNTER — Other Ambulatory Visit: Payer: Self-pay | Admitting: Internal Medicine

## 2017-05-17 ENCOUNTER — Other Ambulatory Visit: Payer: Self-pay | Admitting: Gynecology

## 2017-05-17 DIAGNOSIS — R928 Other abnormal and inconclusive findings on diagnostic imaging of breast: Secondary | ICD-10-CM

## 2017-06-30 ENCOUNTER — Other Ambulatory Visit: Payer: Self-pay | Admitting: Gynecology

## 2017-06-30 DIAGNOSIS — R928 Other abnormal and inconclusive findings on diagnostic imaging of breast: Secondary | ICD-10-CM

## 2017-08-03 ENCOUNTER — Other Ambulatory Visit: Payer: BLUE CROSS/BLUE SHIELD

## 2017-08-07 ENCOUNTER — Other Ambulatory Visit: Payer: BLUE CROSS/BLUE SHIELD

## 2017-08-08 ENCOUNTER — Inpatient Hospital Stay: Admission: RE | Admit: 2017-08-08 | Payer: BLUE CROSS/BLUE SHIELD | Source: Ambulatory Visit

## 2017-08-16 ENCOUNTER — Ambulatory Visit: Payer: BLUE CROSS/BLUE SHIELD

## 2017-08-16 ENCOUNTER — Ambulatory Visit
Admission: RE | Admit: 2017-08-16 | Discharge: 2017-08-16 | Disposition: A | Discharge status: Home / Self Care | Payer: BLUE CROSS/BLUE SHIELD | Source: Ambulatory Visit | Attending: Gynecology | Admitting: Gynecology

## 2017-08-16 DIAGNOSIS — R928 Other abnormal and inconclusive findings on diagnostic imaging of breast: Secondary | ICD-10-CM

## 2018-05-09 ENCOUNTER — Other Ambulatory Visit: Payer: Self-pay | Admitting: Internal Medicine

## 2018-06-06 ENCOUNTER — Other Ambulatory Visit: Payer: Self-pay | Admitting: Internal Medicine

## 2018-07-26 ENCOUNTER — Other Ambulatory Visit: Payer: Self-pay | Admitting: Internal Medicine

## 2018-09-06 ENCOUNTER — Other Ambulatory Visit: Payer: Self-pay | Admitting: Internal Medicine

## 2018-09-10 ENCOUNTER — Other Ambulatory Visit: Payer: Self-pay | Admitting: Internal Medicine

## 2018-10-05 ENCOUNTER — Ambulatory Visit: Payer: BLUE CROSS/BLUE SHIELD | Admitting: Cardiology

## 2018-10-05 ENCOUNTER — Encounter: Payer: Self-pay | Admitting: Cardiology

## 2018-10-05 VITALS — BP 118/76 | HR 67 | Ht 66.0 in | Wt 146.0 lb

## 2018-10-05 DIAGNOSIS — I471 Supraventricular tachycardia: Secondary | ICD-10-CM | POA: Diagnosis not present

## 2018-10-05 MED ORDER — METOPROLOL SUCCINATE ER 25 MG PO TB24: 25.0000 mg | ORAL_TABLET | Freq: Every day | ORAL | 3 refills | Status: DC

## 2018-10-05 NOTE — Patient Instructions (Signed)
Medication Instructions:  Your physician recommends that you continue on your current medications as directed. Please refer to the Current Medication list given to you today.  If you need a refill on your cardiac medications before your next appointment, please call your pharmacy.   Lab work: None If you have labs (blood work) drawn today and your tests are completely normal, you will receive your results only by: Marland Kitchen MyChart Message (if you have MyChart) OR . A paper copy in the mail If you have any lab test that is abnormal or we need to change your treatment, we will call you to review the results.  Testing/Procedures: None  Follow-Up: At [facility], you and your health needs are our priority.  As part of our continuing mission to provide you with exceptional heart care, we have created designated Provider Care Teams.  These Care Teams include your primary Cardiologist (physician) and Advanced Practice Providers (APPs -  Physician Assistants and Nurse Practitioners) who all work together to provide you with the care you need, when you need it. You will need a follow up appointment in 12 months.  Please call our office 2 months in advance to schedule this appointment.  You may see Dr. Caryl Comes or one of the following Advanced Practice Providers on your designated Care Team:   Chanetta Marshall, NP . Tommye Standard, PA-C  Any Other Special Instructions Will Be Listed Below (If Applicable).

## 2018-10-05 NOTE — Progress Notes (Signed)
Cardiology Office Note:    Date:  10/05/2018   ID:  Carolyn Chaney, DOB Sep 29, 1961, MRN [id number]  PCP:  [name], MD  Cardiologist:  No primary care provider on file.  Referring MD: [name], MD   Chief Complaint  Patient presents with  . Follow-up    SVT    History of Present Illness:    Carolyn Chaney is a 57 y.o. female with a past medical history significant for gastric ulcer and anemia, atrial tachycardia.  She is here today for follow-up of a history of SVT which is documented to be atrial tachycardia.  She follows with Dr. Caryl Comes.  She was evaluated for possible consideration of ablation in the past but elected to pursue therapy with flecainide which she never had filled as she was afraid of the side effects.  She has been fairly well controlled on metoprolol.  At her last appointment on 03/01/2016 she was noted to have rare episodes and she was told that she can take an extra dose of beta-blocker if needed. She feels some fluttering every day. She has PVCs that she identifies often. She has had some rare lightheadedness but no syncope since her last visit. She has stopped playing Tennis and doing anything strenuous outside in the heat as she is afraid of the SVT causing her to black out.   She is not as active as she used to be but she still goes to the gym once or twice a week and walks some, 2-3 miles.   Carolyn Chaney is here today for follow-up and medication refill. She drinks about a cup of coffee a day and then a Diet Coke, so she does get some caffeine.  She avoids Sudafed and other medications that are cardiac stimulants.  Past Medical History:  Diagnosis Date  . Chronic GI bleeding 01/27/2016  . Complication of anesthesia   . Dysrhythmia    PVC,s, atrial tachycardia  . Gastric ulcer   . Iron deficiency anemia 11/07/2011  . Irregular heart rate   . PONV (postoperative nausea and vomiting)     Past Surgical History:  Procedure Laterality Date  .  APPENDECTOMY    . BREAST EXCISIONAL BIOPSY Right 1980   Benign  . BREAST SURGERY    . COLONOSCOPY WITH PROPOFOL N/A 02/16/2016   Procedure: COLONOSCOPY WITH PROPOFOL;  Surgeon: Garlan Fair, MD;  Location: [facility];  Service: Endoscopy;  Laterality: N/A;  . ESOPHAGOGASTRODUODENOSCOPY (EGD) WITH PROPOFOL N/A 02/16/2016   Procedure: ESOPHAGOGASTRODUODENOSCOPY (EGD) WITH PROPOFOL;  Surgeon: Garlan Fair, MD;  Location: [facility];  Service: Endoscopy;  Laterality: N/A;  . HERNIA REPAIR     bilateral inguinal hernia as child  . TONSILLECTOMY      Current Medications: Current Meds  Medication Sig  . acetaminophen (TYLENOL) 500 MG tablet Take 500 mg by mouth every 6 (six) hours as needed for mild pain.  Marland Kitchen ALPRAZolam (XANAX) 0.5 MG tablet Take 0.25 mg by mouth daily as needed for anxiety.   . cholecalciferol (VITAMIN D) 1000 units tablet Take 1,000 Units by mouth daily.  Marland Kitchen estradiol (VIVELLE-DOT) 0.075 MG/24HR Place 1 patch onto the skin 2 (two) times a week.  . metoprolol succinate (TOPROL-XL) 25 MG 24 hr tablet Take 1 tablet (25 mg total) by mouth daily.  . progesterone (PROMETRIUM) 200 MG capsule Take 1 capsule by mouth as directed. Take 1 capsule by mouth at bedtime days 1-14 every other month  . sertraline (ZOLOFT) 100 MG tablet Take  100 mg by mouth daily.  . vitamin B-12 (CYANOCOBALAMIN) 1000 MCG tablet Take 1,000 mcg by mouth daily.  . [DISCONTINUED] metoprolol succinate (TOPROL-XL) 25 MG 24 hr tablet Take 25 mg by mouth daily.     Allergies:   Nsaids   Social History   Socioeconomic History  . Marital status: Married    Spouse name: Not on file  . Number of children: Not on file  . Years of education: Not on file  . Highest education level: Not on file  Occupational History  . Not on file  Social Needs  . Financial resource strain: Not on file  . Food insecurity:    Worry: Not on file    Inability: Not on file  . Transportation needs:    Medical: Not on file     Non-medical: Not on file  Tobacco Use  . Smoking status: Never Smoker  . Smokeless tobacco: Never Used  Substance and Sexual Activity  . Alcohol use: Yes    Alcohol/week: 0.0 standard drinks    Comment: ocassionally  . Drug use: No  . Sexual activity: Not on file  Lifestyle  . Physical activity:    Days per week: Not on file    Minutes per session: Not on file  . Stress: Not on file  Relationships  . Social connections:    Talks on phone: Not on file    Gets together: Not on file    Attends religious service: Not on file    Active member of club or organization: Not on file    Attends meetings of clubs or organizations: Not on file    Relationship status: Not on file  Other Topics Concern  . Not on file  Social History Narrative  . Not on file     Family History: The patient's family history includes Heart attack in her father and sister; Heart disease in her father; Lung cancer in her mother. ROS:   Please see the history of present illness.     All other systems reviewed and are negative.  EKGs/Labs/Other Studies Reviewed:    The following studies were reviewed today: none  EKG:  EKG is ordered today.  The ekg ordered today demonstrates normal sinus rhythm, 67 bpm, no abnormalities  Recent Labs: No results found for requested labs within last 8760 hours.   Recent Lipid Panel    Component Value Date/Time   CHOL 212 (H) 09/25/2012 1026   TRIG 150.0 (H) 09/25/2012 1026   HDL 55.50 09/25/2012 1026   CHOLHDL 4 09/25/2012 1026   VLDL 30.0 09/25/2012 1026   LDLDIRECT 137.1 09/25/2012 1026    Physical Exam:    VS:  BP 118/76   Pulse 67   Ht 5' 6"  (1.676 m)   Wt 146 lb (66.2 kg)   SpO2 99%   BMI 23.57 kg/m     Wt Readings from Last 3 Encounters:  10/05/18 146 lb (66.2 kg)  08/11/16 146 lb (66.2 kg)  05/11/16 145 lb (65.8 kg)     Physical Exam  Constitutional: She is oriented to person, place, and time. She appears well-developed and well-nourished.  No distress.  HENT:  Head: Normocephalic and atraumatic.  Neck: Normal range of motion. Neck supple. No JVD present.  Cardiovascular: Normal rate, regular rhythm, normal heart sounds and intact distal pulses. Exam reveals no gallop and no friction rub.  No murmur heard. Pulmonary/Chest: Effort normal and breath sounds normal. No respiratory distress. She has no wheezes. She  has no rales.  Abdominal: Soft. Bowel sounds are normal.  Musculoskeletal: Normal range of motion. She exhibits no edema.  Neurological: She is alert and oriented to person, place, and time.  Skin: Skin is warm and dry.  Psychiatric: She has a normal mood and affect. Her behavior is normal. Judgment and thought content normal.  Vitals reviewed.    ASSESSMENT:    1. SVT (supraventricular tachycardia)-probable    PLAN:    In order of problems listed above:  Atrial tachycardia -Well-controlled on metoprolol succinate 12.5 mg daily with an extra 12.5 mg as needed.  We also reviewed the Valsalva maneuver case she ever needs that for sustained SVT. -She is satisfied with her current level of control and we will continue current therapy.   Medication Adjustments/Labs and Tests Ordered: Current medicines are reviewed at length with the patient today.  Concerns regarding medicines are outlined above. Labs and tests ordered and medication changes are outlined in the patient instructions below:  Patient Instructions  Medication Instructions:  Your physician recommends that you continue on your current medications as directed. Please refer to the Current Medication list given to you today.  If you need a refill on your cardiac medications before your next appointment, please call your pharmacy.   Lab work: None If you have labs (blood work) drawn today and your tests are completely normal, you will receive your results only by: Marland Kitchen MyChart Message (if you have MyChart) OR . A paper copy in the mail If you have any lab  test that is abnormal or we need to change your treatment, we will call you to review the results.  Testing/Procedures: None  Follow-Up: At [facility], you and your health needs are our priority.  As part of our continuing mission to provide you with exceptional heart care, we have created designated Provider Care Teams.  These Care Teams include your primary Cardiologist (physician) and Advanced Practice Providers (APPs -  Physician Assistants and Nurse Practitioners) who all work together to provide you with the care you need, when you need it. You will need a follow up appointment in 12 months.  Please call our office 2 months in advance to schedule this appointment.  You may see Dr. Caryl Comes or one of the following Advanced Practice Providers on your designated Care Team:   Chanetta Marshall, NP . Tommye Standard, PA-C  Any Other Special Instructions Will Be Listed Below (If Applicable).       Signed, Daune Perch, NP  10/05/2018 5:46 PM    Carolyn Chaney

## 2018-12-12 HISTORY — PX: BREAST EXCISIONAL BIOPSY: SUR124

## 2019-07-18 ENCOUNTER — Other Ambulatory Visit: Payer: Self-pay | Admitting: Gynecology

## 2019-07-18 DIAGNOSIS — N6489 Other specified disorders of breast: Secondary | ICD-10-CM

## 2019-07-26 ENCOUNTER — Other Ambulatory Visit: Payer: BLUE CROSS/BLUE SHIELD

## 2019-08-06 ENCOUNTER — Other Ambulatory Visit: Payer: BLUE CROSS/BLUE SHIELD

## 2019-08-14 ENCOUNTER — Other Ambulatory Visit: Payer: Self-pay | Admitting: Gynecology

## 2019-08-14 ENCOUNTER — Ambulatory Visit
Admission: RE | Admit: 2019-08-14 | Discharge: 2019-08-14 | Disposition: A | Discharge status: Home / Self Care | Payer: 59 | Source: Ambulatory Visit | Attending: Gynecology | Admitting: Gynecology

## 2019-08-14 ENCOUNTER — Other Ambulatory Visit: Payer: Self-pay

## 2019-08-14 DIAGNOSIS — N6489 Other specified disorders of breast: Secondary | ICD-10-CM

## 2019-08-28 ENCOUNTER — Other Ambulatory Visit: Payer: Self-pay | Admitting: General Surgery

## 2019-08-28 DIAGNOSIS — N6489 Other specified disorders of breast: Secondary | ICD-10-CM

## 2019-09-05 ENCOUNTER — Ambulatory Visit
Admission: RE | Admit: 2019-09-05 | Discharge: 2019-09-05 | Disposition: A | Discharge status: Home / Self Care | Payer: 59 | Source: Ambulatory Visit | Attending: General Surgery | Admitting: General Surgery

## 2019-09-05 ENCOUNTER — Other Ambulatory Visit: Payer: Self-pay

## 2019-09-05 ENCOUNTER — Other Ambulatory Visit: Payer: Self-pay | Admitting: General Practice

## 2019-09-05 DIAGNOSIS — N6489 Other specified disorders of breast: Secondary | ICD-10-CM

## 2019-09-06 ENCOUNTER — Telehealth: Payer: Self-pay | Admitting: *Deleted

## 2019-09-06 NOTE — Telephone Encounter (Signed)
Path report reviewed with Dr. Marin Olp.  Wants to see patient in about 3 weeks.  Scheduling message sent to Scheduling email.

## 2019-09-09 ENCOUNTER — Telehealth: Payer: Self-pay | Admitting: Hematology & Oncology

## 2019-09-09 ENCOUNTER — Other Ambulatory Visit: Payer: Self-pay | Admitting: General Surgery

## 2019-09-09 DIAGNOSIS — R928 Other abnormal and inconclusive findings on diagnostic imaging of breast: Secondary | ICD-10-CM

## 2019-09-09 NOTE — Telephone Encounter (Signed)
lmom to inform patient of appt 10/9 at 1145 am per 9/25 sch msg

## 2019-09-16 ENCOUNTER — Other Ambulatory Visit: Payer: Self-pay | Admitting: General Surgery

## 2019-09-16 DIAGNOSIS — R928 Other abnormal and inconclusive findings on diagnostic imaging of breast: Secondary | ICD-10-CM

## 2019-09-19 ENCOUNTER — Other Ambulatory Visit: Payer: Self-pay

## 2019-09-19 DIAGNOSIS — D509 Iron deficiency anemia, unspecified: Secondary | ICD-10-CM

## 2019-09-20 ENCOUNTER — Telehealth: Payer: Self-pay | Admitting: Hematology & Oncology

## 2019-09-20 ENCOUNTER — Other Ambulatory Visit: Payer: Self-pay

## 2019-09-20 ENCOUNTER — Encounter: Payer: Self-pay | Admitting: Hematology & Oncology

## 2019-09-20 ENCOUNTER — Inpatient Hospital Stay: Payer: 59 | Attending: Hematology & Oncology | Admitting: Hematology & Oncology

## 2019-09-20 ENCOUNTER — Inpatient Hospital Stay: Payer: 59

## 2019-09-20 VITALS — BP 111/73 | HR 73 | Temp 97.5°F | Resp 18 | Wt 146.0 lb

## 2019-09-20 DIAGNOSIS — D509 Iron deficiency anemia, unspecified: Secondary | ICD-10-CM | POA: Insufficient documentation

## 2019-09-20 DIAGNOSIS — N6091 Unspecified benign mammary dysplasia of right breast: Secondary | ICD-10-CM

## 2019-09-20 DIAGNOSIS — N6099 Unspecified benign mammary dysplasia of unspecified breast: Secondary | ICD-10-CM

## 2019-09-20 DIAGNOSIS — D500 Iron deficiency anemia secondary to blood loss (chronic): Secondary | ICD-10-CM

## 2019-09-20 LAB — CMP (CANCER CENTER ONLY)
ALT: 14 U/L (ref 0–44)
AST: 15 U/L (ref 15–41)
Albumin: 4.6 g/dL (ref 3.5–5.0)
Alkaline Phosphatase: 75 U/L (ref 38–126)
Anion gap: 6 (ref 5–15)
BUN: 18 mg/dL (ref 6–20)
CO2: 32 mmol/L (ref 22–32)
Calcium: 9.8 mg/dL (ref 8.9–10.3)
Chloride: 103 mmol/L (ref 98–111)
Creatinine: 1.03 mg/dL — ABNORMAL HIGH (ref 0.44–1.00)
GFR, Est AFR Am: >60 mL/min (ref >60)
GFR, Estimated: 60 mL/min — ABNORMAL LOW (ref >60)
Glucose, Bld: 79 mg/dL (ref 70–99)
Potassium: 4.6 mmol/L (ref 3.5–5.1)
Sodium: 141 mmol/L (ref 135–145)
Total Bilirubin: 0.4 mg/dL (ref 0.3–1.2)
Total Protein: 7.2 g/dL (ref 6.5–8.1)

## 2019-09-20 LAB — CBC WITH DIFFERENTIAL (CANCER CENTER ONLY)
Abs Immature Granulocytes: 0.03 10*3/uL (ref 0.00–0.07)
Basophils Absolute: 0.1 10*3/uL (ref 0.0–0.1)
Basophils Relative: 1 %
Eosinophils Absolute: 0.1 10*3/uL (ref 0.0–0.5)
Eosinophils Relative: 2 %
HCT: 41 % (ref 36.0–46.0)
Hemoglobin: 13.2 g/dL (ref 12.0–15.0)
Immature Granulocytes: 1 %
Lymphocytes Relative: 26 %
Lymphs Abs: 1.2 10*3/uL (ref 0.7–4.0)
MCH: 31.1 pg (ref 26.0–34.0)
MCHC: 32.2 g/dL (ref 30.0–36.0)
MCV: 96.5 fL (ref 80.0–100.0)
Monocytes Absolute: 0.4 10*3/uL (ref 0.1–1.0)
Monocytes Relative: 10 %
Neutro Abs: 2.8 10*3/uL (ref 1.7–7.7)
Neutrophils Relative %: 60 %
Platelet Count: 238 10*3/uL (ref 150–400)
RBC: 4.25 MIL/uL (ref 3.87–5.11)
RDW: 12.3 % (ref 11.5–15.5)
WBC Count: 4.6 10*3/uL (ref 4.0–10.5)
nRBC: 0 % (ref 0.0–0.2)

## 2019-09-20 LAB — RETICULOCYTES
Immature Retic Fract: 8.2 % (ref 2.3–15.9)
RBC.: 4.26 MIL/uL (ref 3.87–5.11)
Retic Count, Absolute: 45.6 10*3/uL (ref 19.0–186.0)
Retic Ct Pct: 1.1 % (ref 0.4–3.1)

## 2019-09-20 NOTE — Telephone Encounter (Signed)
Called and advised patient of upcoming appointments per 10/9 los

## 2019-09-20 NOTE — Progress Notes (Signed)
Referral MD  Reason for Referral: Iron deficiency anemia                                   Ductal Hyperplasia of the RIGHT                                          Breast.  Chief Complaint  Patient presents with  . Follow-up  : I have not been here for awhile  HPI: Carolyn Chaney is a very charming 58 year old white female.  I have not seen her for over 2 years.  We typically have seen her for iron deficiency anemia.  Thankfully, this is not been a problem for over 2 years.  An issue now is that she had an abnormal mammogram.  She gets routine mammograms.  She had one done on 08/14/2019.  In the right breast, this showed an area of distortion.  There was some partially calcified tissues.  An ultrasound confirmed this area.  This area was probably 6 x 6 x 8 mm.  It was located at the 7 o'clock position of the right breast.  She underwent a biopsy on 09/05/2019.  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  I think the real problem is that she is on estrogen.  She is on the estrogen patch.  She has try to get off the estrogen patch but she has terrible menopausal symptoms because of this.  Her gynecologist is trying to get her on the lowest dose possible.  She sees Dr. Fanny Skates for surgery on 10/09/2019.  Otherwise she is doing well.  We caught up after not seen each other for 2 and half years.  Her daughter got married last year.  They live in [place] now.  She is working for a [employer].  However, because of the coronavirus, she really has not had much in the way of work.  Her husband, thankfully, works from home and has not been furloughed.  She has not noted any pain in the right breast prior to the biopsy.  There is no swelling.  There is no nipple discharge.  She had no fullness in the axilla.  Overall, I would have to say that her performance status is ECOG 0.  She is gained some weight.  She is not working out like she used  to.  She does not smoke.  She rarely has an alcoholic beverage.   There is no history of breast cancer in the family.     Past Medical History:  Diagnosis Date  . Chronic GI bleeding 01/27/2016  . Complication of anesthesia   . Dysrhythmia    PVC,s, atrial tachycardia  . Gastric ulcer   . Iron deficiency anemia 11/07/2011  . Irregular heart rate   . PONV (postoperative nausea and vomiting)   :  Past Surgical History:  Procedure Laterality Date  . APPENDECTOMY    . BREAST EXCISIONAL BIOPSY Right 1980   Benign  . BREAST SURGERY    . COLONOSCOPY WITH PROPOFOL N/A 02/16/2016   Procedure: COLONOSCOPY WITH PROPOFOL;  Surgeon: Garlan Fair, MD;  Location: [facility];  Service: Endoscopy;  Laterality: N/A;  . ESOPHAGOGASTRODUODENOSCOPY (EGD) WITH PROPOFOL N/A 02/16/2016   Procedure: ESOPHAGOGASTRODUODENOSCOPY (EGD) WITH PROPOFOL;  Surgeon: Garlan Fair, MD;  Location: [facility];  Service: Endoscopy;  Laterality: N/A;  . HERNIA REPAIR     bilateral inguinal hernia as child  . TONSILLECTOMY    :   Current Outpatient Medications:  .  acetaminophen (TYLENOL) 500 MG tablet, Take 500 mg by mouth every 6 (six) hours as needed for mild pain., Disp: , Rfl:  .  ALPRAZolam (XANAX) 0.5 MG tablet, Take 0.25 mg by mouth daily as needed for anxiety. , Disp: , Rfl:  .  cholecalciferol (VITAMIN D) 1000 units tablet, Take 1,000 Units by mouth daily., Disp: , Rfl:  .  metoprolol succinate (TOPROL-XL) 25 MG 24 hr tablet, Take 1 tablet (25 mg total) by mouth daily., Disp: 90 tablet, Rfl: 3 .  progesterone (PROMETRIUM) 200 MG capsule, Take 1 capsule by mouth as directed. Take 1 capsule by mouth at bedtime days 1-14 every other month, Disp: , Rfl: 6 .  sertraline (ZOLOFT) 100 MG tablet, Take 100 mg by mouth daily., Disp: , Rfl:  .  vitamin B-12 (CYANOCOBALAMIN) 1000 MCG tablet, Take 1,000 mcg by mouth daily., Disp: , Rfl:  .  VIVELLE-DOT 0.05 MG/24HR patch, Place 0.05 mg onto the skin. Place  half of patch, total of 0.025 mg twice weekly. Pt. Trying to taper off., Disp: , Rfl: :  :  Allergies  Allergen Reactions  . Nsaids Other (See Comments)    Bleeding Bleeding  :  Family History  Problem Relation Age of Onset  . Lung cancer Mother   . Heart attack Father   . Heart disease Father   . Heart attack Sister   :  Social History   Socioeconomic History  . Marital status: Married    Spouse name: Not on file  . Number of children: Not on file  . Years of education: Not on file  . Highest education level: Not on file  Occupational History  . Not on file  Social Needs  . Financial resource strain: Not on file  . Food insecurity    Worry: Not on file    Inability: Not on file  . Transportation needs    Medical: Not on file    Non-medical: Not on file  Tobacco Use  . Smoking status: Never Smoker  . Smokeless tobacco: Never Used  Substance and Sexual Activity  . Alcohol use: Yes    Alcohol/week: 0.0 standard drinks    Comment: ocassionally  . Drug use: No  . Sexual activity: Not on file  Lifestyle  . Physical activity    Days per week: Not on file    Minutes per session: Not on file  . Stress: Not on file  Relationships  . Social Herbalist on phone: Not on file    Gets together: Not on file    Attends religious service: Not on file    Active member of club or organization: Not on file    Attends meetings of clubs or organizations: Not on file    Relationship status: Not on file  . Intimate partner violence    Fear of current or ex partner: Not on file    Emotionally abused: Not on file    Physically abused: Not on file    Forced sexual activity: Not on file  Other Topics Concern  . Not on file  Social History Narrative  . Not on file  :  Review of Systems  Constitutional: Negative.   HENT: Negative.   Eyes: Negative.   Respiratory: Negative.  Cardiovascular: Negative.   Gastrointestinal: Negative.   Genitourinary: Negative.    Musculoskeletal: Negative.   Skin: Negative.   Neurological: Negative.   Endo/Heme/Allergies: Negative.   Psychiatric/Behavioral: Negative.      Exam: Well-developed and well-nourished white female in no obvious distress.  Vital signs are temperature of 97.5.  Pulse 73.  Blood pressure 111/73.  Weight is 146 pounds.  Head neck exam shows no ocular or oral lesions.  She has no palpable cervical or supraclavicular lymph nodes.  Lungs are clear bilaterally.  Cardiac exam regular rate and rhythm with no murmurs, rubs or bruits.  Abdomen is soft.  She has good bowel sounds.  There is no fluid wave.  There is no palpable liver or spleen tip.  Back exam shows no tenderness over the spine, ribs or hips.  Extremities shows no clubbing, cyanosis or edema.  Neurological exam shows no focal neurological deficits.  She deferred me doing a breast exam on her.   @IPVITALS @   Recent Labs    09/20/19 1149  WBC 4.6  HGB 13.2  HCT 41.0  PLT 238   Recent Labs    09/20/19 1149  NA 141  K 4.6  CL 103  CO2 32  GLUCOSE 79  BUN 18  CREATININE 1.03*  CALCIUM 9.8    Blood smear review: None  Pathology: See above    Assessment and Plan: Carolyn Chaney is a charming 58 year old postmenopausal female with a premalignant lesion of the right breast.  The problem is that she is on estrogen.  I realize it is an estrogen patch that is low-dose.  However, she has been on estrogen for about 10 years.  She needs the estrogen for quality of life.  She will have her lumpectomy on 20 October.  I would not think that there would be any invasive breast cancer that we could find.  I suspect that given her diagnosis, and the fact that she needs to be on estrogen, she might need to be on a preventive agent down the road.  I would think that Aromasin would not be a bad idea for her.  I talked her about Aromasin.  She really is not a "a medicine taker."  For her, her quality of life is important.  We will plan on  seeing her back after Thanksgiving.  By then, she will have recovered nicely from her surgery.  We will see the pathology report.  I doubt that her iron studies will be a problem.  She is not anemic.  She has been maintaining her hemoglobin nicely.

## 2019-09-23 LAB — IRON AND TIBC
Iron: 92 ug/dL (ref 41–142)
Saturation Ratios: 28 % (ref 21–57)
TIBC: 330 ug/dL (ref 236–444)
UIBC: 238 ug/dL (ref 120–384)

## 2019-09-23 LAB — FERRITIN: Ferritin: 30 ng/mL (ref 11–307)

## 2019-10-02 ENCOUNTER — Other Ambulatory Visit: Payer: Self-pay

## 2019-10-02 ENCOUNTER — Encounter (HOSPITAL_BASED_OUTPATIENT_CLINIC_OR_DEPARTMENT_OTHER): Payer: Self-pay | Admitting: *Deleted

## 2019-10-03 NOTE — H&P (Signed)
Carolyn Chaney Location: [facility] Patient #: [id number] DOB: 31-Jul-1961 Married / Language: English / Race: White Female     History of Present Illness       This is a 58 year old female who returns following core biopsy of right breast at 12 o'clock position which revealed complex grossly lesion. Carolyn Chaney is her PCP. She has seen Carolyn Chaney in the past for management of anemia. She has seen Carolyn Chaney for tachycardia arrhythmias. Imaging studies were done at [facility]. Carolyn Chaney was chaperoned throughout the encounter.      I saw her about 2 weeks ago. She reported open biopsy of her right breast in 1985 for benign disease She underwent ultrasound-guided core biopsy of right breast at 7 o'clock position 2016 which showed a fibroadenoma. She says this area is still tender and painful. She has chronic pain in her right breast. Recent mammogram showed a focal area of distortion in the upper central right breast, 12 o'clock position, with calcifications. 1 cm above the nipple and somewhat retroareolar. This was felt to be suspicious. 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 Excision recommended. She's had a lot of pain since the core biopsy. She's had some bleeding. She says that swollen.      Past history reveals open biopsy right breast 1985 for benign disease. Ultrasound-guided core biopsy right breast 7 o'clock position 2016 for fibroadenoma. GI bleed 2017 with upper and lower endoscopy. Anemia. Appendectomy. Hernia at age for sounds like bilateral inguinal. Tachycardia arrhythmias on chronic beta blocker. Family history negative for breast or ovarian cancer.     Physical exam shows a biopsy needle site in the right breast at 12:00. Dried blood on the  Steri-Strips but no active drainage. A little swollen and probably has a hematoma. No infection scar superior areolar rim.      I advised her to have this area excised in 3 or 4 weeks once the hematoma settles down She agrees She asked about the tender fibroadenoma at 7:00 and I advised against excision of this at this time since the pain was not well explained        Plan: Scheduled for right breast lumpectomy with RSL in 3-4 weeks. I discussed the Indications, details, techniques, and numerous risk of the surgery. She is aware of the risk of bleeding, infection, cosmetic deformity, chronic pain, reoperation if cancer. She understands all these issues. All of her questions are answered. She agrees with this plan.     Allergies NSAIDs  Allergies Reconciled   Medication History  Metoprolol Succinate ER (25MG Tablet ER 24HR, Oral) Active. Sertraline HCl (100MG Tablet, Oral) Active. Tylenol (325MG Tablet, Oral) Active. Progesterone Micronized (200MG Capsule, Oral) Active. Estradiol (0.075MG/24HR Patch TW, Transdermal) Active.  Vitals  Weight: 147.6 lb Height: 66in Body Surface Area: 1.76 m Body Mass Index: 23.82 kg/m  Temp.: 98.63F  Pulse: 106 (Regular)  P.OX: 99% (Room air) BP: 120/75 (Sitting, Left Arm, Standard)       Physical Exam  General Mental Status-Alert. General Appearance-Not in acute distress. Build & Nutrition-Well nourished. Posture-Normal posture. Gait-Normal. Note: Very pleasant with good insight. Frustrated by pain. Mild to moderate anxiety due to frustration with pain chronically and acutely   Head and Neck Head-normocephalic, atraumatic with no lesions or  palpable masses. Trachea-midline. Thyroid Gland Characteristics - normal size and consistency and no palpable nodules.  Chest and Lung Exam Chest and lung exam reveals -on auscultation, normal breath sounds, no adventitious sounds and normal vocal  resonance.  Breast Note: Breasts are small to medium size. The right breast is a little bit swollen from the recent biopsy. Perhaps a small hematoma is palpable. More tender on the right than the left. Healed circumareolar scar superiorly right breast. No other skin changes. No axillary adenopathy.   Cardiovascular Cardiovascular examination reveals -normal heart sounds, regular rate and rhythm with no murmurs and femoral artery auscultation bilaterally reveals normal pulses, no bruits, no thrills.  Abdomen Inspection Inspection of the abdomen reveals - No Hernias. Palpation/Percussion Palpation and Percussion of the abdomen reveal - Soft, Non Tender, No Rigidity (guarding), No hepatosplenomegaly and No Palpable abdominal masses.  Neurologic Neurologic evaluation reveals -alert and oriented x 3 with no impairment of recent or remote memory, normal attention span and ability to concentrate, normal sensation and normal coordination.  Musculoskeletal Normal Exam - Bilateral-Upper Extremity Strength Normal and Lower Extremity Strength Normal.    Assessment & Plan  ABNORMAL MAMMOGRAM OF RIGHT BREAST (R92.8)   you recently underwent image guided biopsy of the distortion in your right breast at 12 o'clock position You have developed a hematoma which is causing pain Most likely this will settle down over the next 2 or 3 weeks  The biopsy shows complex sclerosing lesion No cancer was seen However, this is a high risk lesion and there is somewhere between a 5 and 9% chance that she may have an early cancer at this time Excision of this area is recommended You agree  We will wait 3-4 weeks for the hematoma to settle down you will be scheduled for right breast lumpectomy with radioactive seed localization We have discussed the indications, techniques, and risk of the surgery in detail  HISTORY OF RIGHT BREAST BIOPSY (Z98.890) HISTORY OF GASTROINTESTINAL BLEEDING (Z87.19) HISTORY  OF LAPAROSCOPIC APPENDECTOMY (Z90.49) HISTORY OF TACHYCARDIA (R51.884)    Carolyn Chaney. Carolyn Chaney, M.D., [facility] General and Minimally invasive Surgery Breast and Colorectal Surgery 0

## 2019-10-05 ENCOUNTER — Other Ambulatory Visit (HOSPITAL_COMMUNITY)
Admission: RE | Admit: 2019-10-05 | Discharge: 2019-10-05 | Disposition: A | Discharge status: Home / Self Care | Payer: 59 | Source: Ambulatory Visit | Attending: General Surgery | Admitting: General Surgery

## 2019-10-05 DIAGNOSIS — Z01812 Encounter for preprocedural laboratory examination: Secondary | ICD-10-CM | POA: Diagnosis present

## 2019-10-05 DIAGNOSIS — Z20828 Contact with and (suspected) exposure to other viral communicable diseases: Secondary | ICD-10-CM | POA: Diagnosis not present

## 2019-10-06 LAB — NOVEL CORONAVIRUS, NAA (HOSP ORDER, SEND-OUT TO REF LAB; TAT 18-24 HRS): SARS-CoV-2, NAA: NOT DETECTED

## 2019-10-07 ENCOUNTER — Encounter (HOSPITAL_BASED_OUTPATIENT_CLINIC_OR_DEPARTMENT_OTHER)
Admission: RE | Admit: 2019-10-07 | Discharge: 2019-10-07 | Disposition: A | Discharge status: Home / Self Care | Payer: 59 | Source: Ambulatory Visit | Attending: General Surgery | Admitting: General Surgery

## 2019-10-07 NOTE — Progress Notes (Signed)
      Enhanced Recovery after Surgery  Enhanced Recovery after Surgery is a protocol used to improve the stress on your body and your recovery after surgery.  Patient Instructions  . The night before surgery:  o No food after midnight. ONLY clear liquids after midnight  . The day of surgery (if you do NOT have diabetes):  o Drink ONE (1) Pre-Surgery Clear Ensure as directed.   o This drink was given to you during your hospital  pre-op appointment visit. o The pre-op nurse will instruct you on the time to drink the  Pre-Surgery Ensure depending on your surgery time. o Finish the drink at the designated time by the pre-op nurse.  o Nothing else to drink after completing the  Pre-Surgery Clear Ensure.  . The day of surgery (if you have diabetes): o Drink ONE (1) Gatorade 2 (G2) as directed. o This drink was given to you during your hospital  pre-op appointment visit.  o The pre-op nurse will instruct you on the time to drink the   Gatorade 2 (G2) depending on your surgery time. o Color of the Gatorade may vary. Red is not allowed. o Nothing else to drink after completing the  Gatorade 2 (G2).         If you have questions, please contact your surgeon's office.  Surgical soap also given to patient with instructions for use.  Patient verbalized understanding of instructions.

## 2019-10-08 ENCOUNTER — Other Ambulatory Visit: Payer: Self-pay

## 2019-10-08 ENCOUNTER — Ambulatory Visit
Admission: RE | Admit: 2019-10-08 | Discharge: 2019-10-08 | Disposition: A | Discharge status: Home / Self Care | Payer: 59 | Source: Ambulatory Visit | Attending: General Surgery | Admitting: General Surgery

## 2019-10-08 DIAGNOSIS — R928 Other abnormal and inconclusive findings on diagnostic imaging of breast: Secondary | ICD-10-CM

## 2019-10-09 ENCOUNTER — Encounter (HOSPITAL_BASED_OUTPATIENT_CLINIC_OR_DEPARTMENT_OTHER)
Admission: RE | Disposition: A | Discharge status: Home / Self Care | Payer: Self-pay | Source: Home / Self Care | Attending: General Surgery

## 2019-10-09 ENCOUNTER — Ambulatory Visit
Admission: RE | Admit: 2019-10-09 | Discharge: 2019-10-09 | Disposition: A | Discharge status: Home / Self Care | Payer: 59 | Source: Ambulatory Visit | Attending: General Surgery | Admitting: General Surgery

## 2019-10-09 ENCOUNTER — Ambulatory Visit (HOSPITAL_BASED_OUTPATIENT_CLINIC_OR_DEPARTMENT_OTHER): Payer: 59 | Admitting: Certified Registered"

## 2019-10-09 ENCOUNTER — Other Ambulatory Visit: Payer: Self-pay

## 2019-10-09 ENCOUNTER — Ambulatory Visit (HOSPITAL_BASED_OUTPATIENT_CLINIC_OR_DEPARTMENT_OTHER)
Admission: RE | Admit: 2019-10-09 | Discharge: 2019-10-09 | Disposition: A | Discharge status: Home / Self Care | Payer: 59 | Attending: General Surgery | Admitting: General Surgery

## 2019-10-09 ENCOUNTER — Encounter (HOSPITAL_BASED_OUTPATIENT_CLINIC_OR_DEPARTMENT_OTHER): Payer: Self-pay | Admitting: Certified Registered"

## 2019-10-09 DIAGNOSIS — I471 Supraventricular tachycardia: Secondary | ICD-10-CM | POA: Diagnosis not present

## 2019-10-09 DIAGNOSIS — N6489 Other specified disorders of breast: Secondary | ICD-10-CM | POA: Insufficient documentation

## 2019-10-09 DIAGNOSIS — F419 Anxiety disorder, unspecified: Secondary | ICD-10-CM | POA: Insufficient documentation

## 2019-10-09 DIAGNOSIS — D241 Benign neoplasm of right breast: Secondary | ICD-10-CM | POA: Insufficient documentation

## 2019-10-09 DIAGNOSIS — Z79899 Other long term (current) drug therapy: Secondary | ICD-10-CM | POA: Diagnosis not present

## 2019-10-09 DIAGNOSIS — R928 Other abnormal and inconclusive findings on diagnostic imaging of breast: Secondary | ICD-10-CM | POA: Diagnosis present

## 2019-10-09 DIAGNOSIS — Z79890 Hormone replacement therapy (postmenopausal): Secondary | ICD-10-CM | POA: Diagnosis not present

## 2019-10-09 HISTORY — DX: Unspecified lump in the right breast, unspecified quadrant: N63.10

## 2019-10-09 HISTORY — DX: Anxiety disorder, unspecified: F41.9

## 2019-10-09 HISTORY — DX: Depression, unspecified: F32.A

## 2019-10-09 HISTORY — PX: BREAST LUMPECTOMY WITH RADIOACTIVE SEED LOCALIZATION: SHX6424

## 2019-10-09 SURGERY — BREAST LUMPECTOMY WITH RADIOACTIVE SEED LOCALIZATION
Anesthesia: General | Site: Breast | Laterality: Right

## 2019-10-09 MED ORDER — ONDANSETRON HCL 4 MG/2ML IJ SOLN
INTRAMUSCULAR | Status: DC | PRN
  Administered 2019-10-09: 4 mg via INTRAVENOUS

## 2019-10-09 MED ORDER — FENTANYL CITRATE (PF) 100 MCG/2ML IJ SOLN
50.0000 ug | INTRAMUSCULAR | Status: DC | PRN
  Administered 2019-10-09: 100 ug via INTRAVENOUS

## 2019-10-09 MED ORDER — GABAPENTIN 300 MG PO CAPS
ORAL_CAPSULE | ORAL | Status: AC
  Filled 2019-10-09: qty 1

## 2019-10-09 MED ORDER — FENTANYL CITRATE (PF) 100 MCG/2ML IJ SOLN
INTRAMUSCULAR | Status: AC
  Filled 2019-10-09: qty 2

## 2019-10-09 MED ORDER — ONDANSETRON HCL 4 MG/2ML IJ SOLN
INTRAMUSCULAR | Status: AC
  Filled 2019-10-09: qty 6

## 2019-10-09 MED ORDER — PROPOFOL 10 MG/ML IV BOLUS
INTRAVENOUS | Status: DC | PRN
  Administered 2019-10-09: 150 mg via INTRAVENOUS

## 2019-10-09 MED ORDER — MIDAZOLAM HCL 2 MG/2ML IJ SOLN
INTRAMUSCULAR | Status: AC
  Filled 2019-10-09: qty 2

## 2019-10-09 MED ORDER — HYDROCODONE-ACETAMINOPHEN 5-325 MG PO TABS: 1.0000 | ORAL_TABLET | Freq: Four times a day (QID) | ORAL | 0 refills | Status: DC | PRN

## 2019-10-09 MED ORDER — CELECOXIB 200 MG PO CAPS
ORAL_CAPSULE | ORAL | Status: AC
  Filled 2019-10-09: qty 1

## 2019-10-09 MED ORDER — BUPIVACAINE HCL (PF) 0.5 % IJ SOLN
INTRAMUSCULAR | Status: DC | PRN
  Administered 2019-10-09: 13 mL

## 2019-10-09 MED ORDER — ACETAMINOPHEN 500 MG PO TABS
ORAL_TABLET | ORAL | Status: AC
  Filled 2019-10-09: qty 2

## 2019-10-09 MED ORDER — CHLORHEXIDINE GLUCONATE CLOTH 2 % EX PADS: 6.0000 | MEDICATED_PAD | Freq: Once | CUTANEOUS | Status: DC

## 2019-10-09 MED ORDER — GABAPENTIN 300 MG PO CAPS
300.0000 mg | ORAL_CAPSULE | ORAL | Status: AC
  Administered 2019-10-09: 300 mg via ORAL

## 2019-10-09 MED ORDER — LIDOCAINE HCL (CARDIAC) PF 100 MG/5ML IV SOSY
PREFILLED_SYRINGE | INTRAVENOUS | Status: DC | PRN
  Administered 2019-10-09: 60 mg via INTRAVENOUS

## 2019-10-09 MED ORDER — OXYCODONE HCL 5 MG PO TABS: 5.0000 mg | ORAL_TABLET | Freq: Once | ORAL | Status: DC | PRN

## 2019-10-09 MED ORDER — MEPERIDINE HCL 25 MG/ML IJ SOLN: 6.2500 mg | INTRAMUSCULAR | Status: DC | PRN

## 2019-10-09 MED ORDER — ONDANSETRON HCL 4 MG/2ML IJ SOLN
INTRAMUSCULAR | Status: AC
  Filled 2019-10-09: qty 12

## 2019-10-09 MED ORDER — LIDOCAINE 2% (20 MG/ML) 5 ML SYRINGE
INTRAMUSCULAR | Status: AC
  Filled 2019-10-09: qty 30

## 2019-10-09 MED ORDER — MIDAZOLAM HCL 2 MG/2ML IJ SOLN
1.0000 mg | INTRAMUSCULAR | Status: DC | PRN
  Administered 2019-10-09: 2 mg via INTRAVENOUS

## 2019-10-09 MED ORDER — SUCCINYLCHOLINE CHLORIDE 200 MG/10ML IV SOSY
PREFILLED_SYRINGE | INTRAVENOUS | Status: AC
  Filled 2019-10-09: qty 10

## 2019-10-09 MED ORDER — ACETAMINOPHEN 500 MG PO TABS
1000.0000 mg | ORAL_TABLET | ORAL | Status: AC
  Administered 2019-10-09: 10:00:00 1000 mg via ORAL

## 2019-10-09 MED ORDER — LACTATED RINGERS IV SOLN
INTRAVENOUS | Status: DC
  Administered 2019-10-09 (×2): via INTRAVENOUS

## 2019-10-09 MED ORDER — CEFAZOLIN SODIUM-DEXTROSE 2-4 GM/100ML-% IV SOLN
INTRAVENOUS | Status: AC
  Filled 2019-10-09: qty 100

## 2019-10-09 MED ORDER — CEFAZOLIN SODIUM-DEXTROSE 2-4 GM/100ML-% IV SOLN
2.0000 g | INTRAVENOUS | Status: AC
  Administered 2019-10-09: 2 g via INTRAVENOUS

## 2019-10-09 MED ORDER — PROPOFOL 500 MG/50ML IV EMUL
INTRAVENOUS | Status: DC | PRN
  Administered 2019-10-09: 25 ug/kg/min via INTRAVENOUS

## 2019-10-09 MED ORDER — SODIUM CHLORIDE 0.9% FLUSH: 3.0000 mL | Freq: Two times a day (BID) | INTRAVENOUS | Status: DC

## 2019-10-09 MED ORDER — CELECOXIB 200 MG PO CAPS: 200.0000 mg | ORAL_CAPSULE | ORAL | Status: DC

## 2019-10-09 MED ORDER — OXYCODONE HCL 5 MG/5ML PO SOLN: 5.0000 mg | Freq: Once | ORAL | Status: DC | PRN

## 2019-10-09 MED ORDER — FENTANYL CITRATE (PF) 100 MCG/2ML IJ SOLN
25.0000 ug | INTRAMUSCULAR | Status: DC | PRN
  Administered 2019-10-09 (×2): 50 ug via INTRAVENOUS

## 2019-10-09 MED ORDER — DEXAMETHASONE SODIUM PHOSPHATE 4 MG/ML IJ SOLN
INTRAMUSCULAR | Status: DC | PRN
  Administered 2019-10-09: 10 mg via INTRAVENOUS

## 2019-10-09 MED ORDER — EPHEDRINE SULFATE 50 MG/ML IJ SOLN
INTRAMUSCULAR | Status: DC | PRN
  Administered 2019-10-09: 10 mg via INTRAVENOUS

## 2019-10-09 MED ORDER — PROMETHAZINE HCL 25 MG/ML IJ SOLN: 6.2500 mg | INTRAMUSCULAR | Status: DC | PRN

## 2019-10-09 MED ORDER — DEXAMETHASONE SODIUM PHOSPHATE 10 MG/ML IJ SOLN
INTRAMUSCULAR | Status: AC
  Filled 2019-10-09: qty 3

## 2019-10-09 SURGICAL SUPPLY — 62 items
APPLIER CLIP 9.375 MED OPEN (MISCELLANEOUS) ×2
BENZOIN TINCTURE PRP APPL 2/3 (GAUZE/BANDAGES/DRESSINGS) IMPLANT
BINDER BREAST LRG (GAUZE/BANDAGES/DRESSINGS) ×2 IMPLANT
BINDER BREAST MEDIUM (GAUZE/BANDAGES/DRESSINGS) IMPLANT
BINDER BREAST XLRG (GAUZE/BANDAGES/DRESSINGS) IMPLANT
BINDER BREAST XXLRG (GAUZE/BANDAGES/DRESSINGS) IMPLANT
BLADE HEX COATED 2.75 (ELECTRODE) ×2 IMPLANT
BLADE SURG 10 STRL SS (BLADE) IMPLANT
BLADE SURG 15 STRL LF DISP TIS (BLADE) ×1 IMPLANT
BLADE SURG 15 STRL SS (BLADE) ×1
CANISTER SUC SOCK COL 7IN (MISCELLANEOUS) IMPLANT
CANISTER SUCT 1200ML W/VALVE (MISCELLANEOUS) ×2 IMPLANT
CHLORAPREP W/TINT 26 (MISCELLANEOUS) ×2 IMPLANT
CLIP APPLIE 9.375 MED OPEN (MISCELLANEOUS) ×1 IMPLANT
COVER BACK TABLE REUSABLE LG (DRAPES) ×2 IMPLANT
COVER MAYO STAND REUSABLE (DRAPES) ×2 IMPLANT
COVER PROBE W GEL 5X96 (DRAPES) ×2 IMPLANT
COVER WAND RF STERILE (DRAPES) IMPLANT
DECANTER SPIKE VIAL GLASS SM (MISCELLANEOUS) IMPLANT
DERMABOND ADVANCED (GAUZE/BANDAGES/DRESSINGS) ×1
DERMABOND ADVANCED .7 DNX12 (GAUZE/BANDAGES/DRESSINGS) ×1 IMPLANT
DRAPE HALF SHEET 70X43 (DRAPES) IMPLANT
DRAPE LAPAROSCOPIC ABDOMINAL (DRAPES) ×2 IMPLANT
DRAPE UTILITY XL STRL (DRAPES) ×2 IMPLANT
DRSG PAD ABDOMINAL 8X10 ST (GAUZE/BANDAGES/DRESSINGS) ×2 IMPLANT
ELECT REM PT RETURN 9FT ADLT (ELECTROSURGICAL) ×2
ELECTRODE REM PT RTRN 9FT ADLT (ELECTROSURGICAL) ×1 IMPLANT
GAUZE SPONGE 4X4 12PLY STRL LF (GAUZE/BANDAGES/DRESSINGS) ×2 IMPLANT
GLOVE BIO SURGEON STRL SZ7 (GLOVE) ×2 IMPLANT
GLOVE BIOGEL PI IND STRL 7.5 (GLOVE) ×1 IMPLANT
GLOVE BIOGEL PI INDICATOR 7.5 (GLOVE) ×1
GLOVE EXAM NITRILE MD LF STRL (GLOVE) ×2 IMPLANT
GLOVE SS BIOGEL STRL SZ 7 (GLOVE) ×1 IMPLANT
GLOVE SUPERSENSE BIOGEL SZ 7 (GLOVE) ×1
GOWN STRL REUS W/ TWL LRG LVL3 (GOWN DISPOSABLE) IMPLANT
GOWN STRL REUS W/ TWL XL LVL3 (GOWN DISPOSABLE) ×2 IMPLANT
GOWN STRL REUS W/TWL LRG LVL3 (GOWN DISPOSABLE)
GOWN STRL REUS W/TWL XL LVL3 (GOWN DISPOSABLE) ×2
ILLUMINATOR WAVEGUIDE N/F (MISCELLANEOUS) IMPLANT
KIT MARKER MARGIN INK (KITS) ×2 IMPLANT
LIGHT WAVEGUIDE WIDE FLAT (MISCELLANEOUS) IMPLANT
NEEDLE HYPO 25X1 1.5 SAFETY (NEEDLE) ×2 IMPLANT
NS IRRIG 1000ML POUR BTL (IV SOLUTION) ×2 IMPLANT
PACK BASIN DAY SURGERY FS (CUSTOM PROCEDURE TRAY) ×2 IMPLANT
PENCIL BUTTON HOLSTER BLD 10FT (ELECTRODE) ×2 IMPLANT
SLEEVE SCD COMPRESS KNEE MED (MISCELLANEOUS) ×2 IMPLANT
SPONGE LAP 18X18 RF (DISPOSABLE) IMPLANT
SPONGE LAP 4X18 RFD (DISPOSABLE) ×4 IMPLANT
STRIP CLOSURE SKIN 1/2X4 (GAUZE/BANDAGES/DRESSINGS) IMPLANT
SUT ETHILON 3 0 FSL (SUTURE) IMPLANT
SUT MNCRL AB 4-0 PS2 18 (SUTURE) ×2 IMPLANT
SUT SILK 2 0 SH (SUTURE) ×2 IMPLANT
SUT VIC AB 2-0 CT1 27 (SUTURE)
SUT VIC AB 2-0 CT1 TAPERPNT 27 (SUTURE) IMPLANT
SUT VIC AB 3-0 SH 27 (SUTURE)
SUT VIC AB 3-0 SH 27X BRD (SUTURE) IMPLANT
SUT VICRYL 3-0 CR8 SH (SUTURE) ×2 IMPLANT
SYR 10ML LL (SYRINGE) ×2 IMPLANT
TOWEL GREEN STERILE FF (TOWEL DISPOSABLE) ×2 IMPLANT
TRAY FAXITRON CT DISP (TRAY / TRAY PROCEDURE) ×2 IMPLANT
TUBE CONNECTING 20X1/4 (TUBING) ×2 IMPLANT
YANKAUER SUCT BULB TIP NO VENT (SUCTIONS) ×2 IMPLANT

## 2019-10-09 NOTE — Transfer of Care (Signed)
Immediate Anesthesia Transfer of Care Note  Patient: Carolyn Chaney  Procedure(s) Performed: RIGHT BREAST LUMPECTOMY WITH RADIOACTIVE SEED LOCALIZATION (Right Breast)  Patient Location: PACU  Anesthesia Type:General  Level of Consciousness: awake, alert , oriented and patient cooperative  Airway & Oxygen Therapy: Patient Spontanous Breathing and Patient connected to face mask oxygen  Post-op Assessment: Report given to RN and Post -op Vital signs reviewed and stable  Post vital signs: Reviewed and stable  Last Vitals:  Vitals Value Taken Time  BP    Temp    Pulse 95 10/09/19 1136  Resp    SpO2 98 % 10/09/19 1136  Vitals shown include unvalidated device data.  Last Pain:  Vitals:   10/09/19 1014  TempSrc: Oral      Patients Stated Pain Goal: 4 (32/95/18 8416)  Complications: No apparent anesthesia complications

## 2019-10-09 NOTE — Anesthesia Postprocedure Evaluation (Signed)
Anesthesia Post Note  Patient: Carolyn Chaney  Procedure(s) Performed: RIGHT BREAST LUMPECTOMY WITH RADIOACTIVE SEED LOCALIZATION (Right Breast)     Patient location during evaluation: PACU Anesthesia Type: General Level of consciousness: sedated and patient cooperative Pain management: pain level controlled Vital Signs Assessment: post-procedure vital signs reviewed and stable Respiratory status: spontaneous breathing Cardiovascular status: stable Anesthetic complications: no    Last Vitals:  Vitals:   10/09/19 1230 10/09/19 1324  BP: (!) 117/94 134/81  Pulse: 85 75  Resp: 16 (!) 98  Temp:  36.5 C  SpO2: 98% 98%    Last Pain:  Vitals:   10/09/19 1324  TempSrc: Oral  PainSc: [place]

## 2019-10-09 NOTE — Interval H&P Note (Signed)
History and Physical Interval Note:  10/09/2019 10:10 AM  Carolyn Chaney  has presented today for surgery, with the diagnosis of right breast complex sclerosing lesion.  The various methods of treatment have been discussed with the patient and family. After consideration of risks, benefits and other options for treatment, the patient has consented to  Procedure(s): RIGHT BREAST LUMPECTOMY WITH RADIOACTIVE SEED LOCALIZATION (Right) as a surgical intervention.  The patient's history has been reviewed, patient examined, no change in status, stable for surgery.  I have reviewed the patient's chart and labs.  Questions were answered to the patient's satisfaction.     Adin Hector

## 2019-10-09 NOTE — Anesthesia Preprocedure Evaluation (Addendum)
Anesthesia Evaluation  Patient identified by MRN, date of birth, ID band Patient awake    Reviewed: Allergy & Precautions, NPO status , Patient's Chart, lab work & pertinent test results, reviewed documented beta blocker date and time   History of Anesthesia Complications (+) PONV and history of anesthetic complications  Airway Mallampati: II  TM Distance: >3 FB Neck ROM: Full    Dental  (+) Dental Advisory Given, Caps, Implants   Pulmonary shortness of breath,    Pulmonary exam normal breath sounds clear to auscultation       Cardiovascular Normal cardiovascular exam+ dysrhythmias (atrial tachycardia on metoprolol)  Rhythm:Regular Rate:Normal     Neuro/Psych PSYCHIATRIC DISORDERS Anxiety negative neurological ROS     GI/Hepatic Neg liver ROS, PUD,   Endo/Other  negative endocrine ROS  Renal/GU negative Renal ROS     Musculoskeletal negative musculoskeletal ROS (+)   Abdominal   Peds  Hematology  (+) Blood dyscrasia, anemia ,   Anesthesia Other Findings Day of surgery medications reviewed with the patient.  Reproductive/Obstetrics                             Anesthesia Physical  Anesthesia Plan  ASA: II  Anesthesia Plan: General   Post-op Pain Management:    Induction: Intravenous  PONV Risk Score and Plan: 4 or greater and Ondansetron, Dexamethasone, Treatment may vary due to age or medical condition, Midazolam and Scopolamine patch - Pre-op  Airway Management Planned: LMA  Additional Equipment: None  Intra-op Plan:   Post-operative Plan: Extubation in OR  Informed Consent: I have reviewed the patients History and Physical, chart, labs and discussed the procedure including the risks, benefits and alternatives for the proposed anesthesia with the patient or authorized representative who has indicated his/her understanding and acceptance.     Dental advisory given  Plan  Discussed with: CRNA  Anesthesia Plan Comments:         Anesthesia Quick Evaluation

## 2019-10-09 NOTE — Discharge Instructions (Signed)
[place] Office Phone Number [phone]  BREAST BIOPSY/ PARTIAL MASTECTOMY: POST OP INSTRUCTIONS  Always review your discharge instruction sheet given to you by the facility where your surgery was performed.  IF YOU HAVE DISABILITY OR FAMILY LEAVE FORMS, YOU MUST BRING THEM TO THE OFFICE FOR PROCESSING.  DO NOT GIVE THEM TO YOUR DOCTOR.  1. A prescription for pain medication may be given to you upon discharge.  Take your pain medication as prescribed, if needed.  If narcotic pain medicine is not needed, then you may take acetaminophen (Tylenol) or ibuprofen (Advil) as needed. 2. Take your usually prescribed medications unless otherwise directed 3. If you need a refill on your pain medication, please contact your pharmacy.  They will contact our office to request authorization.  Prescriptions will not be filled after 5pm or on week-ends. 4. You should eat very light the first 24 hours after surgery, such as soup, crackers, pudding, etc.  Resume your normal diet the day after surgery. 5. Most patients will experience some swelling and bruising in the breast.  Ice packs and a good support bra will help.  Swelling and bruising can take several days to resolve.  6. It is common to experience some constipation if taking pain medication after surgery.  Increasing fluid intake and taking a stool softener will usually help or prevent this problem from occurring.  A mild laxative (Milk of Magnesia or Miralax) should be taken according to package directions if there are no bowel movements after 48 hours. 7. Unless discharge instructions indicate otherwise, you may remove your bandages 24-48 hours after surgery, and you may shower at that time.  You may have steri-strips (small skin tapes) in place directly over the incision.  These strips should be left on the skin for 7-10 days.  If your surgeon used skin glue on the incision, you may shower in 24 hours.  The glue will flake off over the  next 2-3 weeks.  Any sutures or staples will be removed at the office during your follow-up visit. 8. ACTIVITIES:  You may resume regular daily activities (gradually increasing) beginning the next day.  Wearing a good support bra or sports bra minimizes pain and swelling.  You may have sexual intercourse when it is comfortable. a. You may drive when you no longer are taking prescription pain medication, you can comfortably wear a seatbelt, and you can safely maneuver your car and apply brakes. b. RETURN TO WORK:  ______________________________________________________________________________________ 9. You should see your doctor in the office for a follow-up appointment approximately two weeks after your surgery.  Your doctors nurse will typically make your follow-up appointment when she calls you with your pathology report.  Expect your pathology report 2-3 business days after your surgery.  You may call to check if you do not hear from [place] after three days. 10. OTHER INSTRUCTIONS: _______________________________________________________________________________________________ _____________________________________________________________________________________________________________________________________ _____________________________________________________________________________________________________________________________________ _____________________________________________________________________________________________________________________________________  WHEN TO CALL YOUR DOCTOR: 1. Fever over 101.0 2. Nausea and/or vomiting. 3. Extreme swelling or bruising. 4. Continued bleeding from incision. 5. Increased pain, redness, or drainage from the incision.  The clinic staff is available to answer your questions during regular business hours.  Please dont hesitate to call and ask to speak to one of the nurses for clinical concerns.  If you have a medical emergency, go to the nearest  emergency room or call 911.  A surgeon from [facility] is always on call at the hospital.  For further questions, please visit [email or web address]  Managing Your Pain After Surgery Without Opioids    Thank you for participating in our program to help patients manage their pain after surgery without opioids. This is part of our effort to provide you with the best care possible, without exposing you or your family to the risk that opioids pose.  What pain can I expect after surgery? You can expect to have some pain after surgery. This is normal. The pain is typically worse the day after surgery, and quickly begins to get better. Many studies have found that many patients are able to manage their pain after surgery with Over-the-Counter (OTC) medications such as Tylenol and Motrin. If you have a condition that does not allow you to take Tylenol or Motrin, notify your surgical team.  How will I manage my pain? The best strategy for controlling your pain after surgery is around the clock pain control with Tylenol (acetaminophen) and Motrin (ibuprofen or Advil). Alternating these medications with each other allows you to maximize your pain control. In addition to Tylenol and Motrin, you can use heating pads or ice packs on your incisions to help reduce your pain.  How will I alternate your regular strength over-the-counter pain medication? You will take a dose of pain medication every three hours. ; Start by taking 650 mg of Tylenol (2 pills of 325 mg) ; 3 hours later take 600 mg of Motrin (3 pills of 200 mg) ; 3 hours after taking the Motrin take 650 mg of Tylenol ; 3 hours after that take 600 mg of Motrin.   - 1 -  See example - if your first dose of Tylenol is at 12:00 PM   12:00 PM Tylenol 650 mg (2 pills of 325 mg)  3:00 PM Motrin 600 mg (3 pills of 200 mg)  6:00 PM Tylenol 650 mg (2 pills of 325 mg)  9:00 PM Motrin 600 mg (3 pills of  200 mg)  Continue alternating every 3 hours   We recommend that you follow this schedule around-the-clock for at least 3 days after surgery, or until you feel that it is no longer needed. Use the table on the last page of this handout to keep track of the medications you are taking. Important: Do not take more than 3033m of Tylenol or 32012mof Motrin in a 24-hour period. Do not take ibuprofen/Motrin if you have a history of bleeding stomach ulcers, severe kidney disease, &/or actively taking a blood thinner  What if I still have pain? If you have pain that is not controlled with the over-the-counter pain medications (Tylenol and Motrin or Advil) you might have what we call breakthrough pain. You will receive a prescription for a small amount of an opioid pain medication such as Oxycodone, Tramadol, or Tylenol with Codeine. Use these opioid pills in the first 24 hours after surgery if you have breakthrough pain. Do not take more than 1 pill every 4-6 hours.  If you still have uncontrolled pain after using all opioid pills, don't hesitate to call our staff using the number provided. We will help make sure you are managing your pain in the best way possible, and if necessary, we can provide a prescription for additional pain medication.   Day 1    Time  Name of Medication Number of pills taken  Amount of Acetaminophen  Pain Level   Comments  AM PM       AM PM       AM PM  AM PM       AM PM       AM PM       AM PM       AM PM       Total Daily amount of Acetaminophen Do not take more than  3,000 mg per day      Day 2    Time  Name of Medication Number of pills taken  Amount of Acetaminophen  Pain Level   Comments  AM PM       AM PM       AM PM       AM PM       AM PM       AM PM       AM PM       AM PM       Total Daily amount of Acetaminophen Do not take more than  3,000 mg per day      Day 3    Time  Name of Medication Number of pills taken  Amount of  Acetaminophen  Pain Level   Comments  AM PM       AM PM       AM PM       AM PM          AM PM       AM PM       AM PM       AM PM       Total Daily amount of Acetaminophen Do not take more than  3,000 mg per day      Day 4    Time  Name of Medication Number of pills taken  Amount of Acetaminophen  Pain Level   Comments  AM PM       AM PM       AM PM       AM PM       AM PM       AM PM       AM PM       AM PM       Total Daily amount of Acetaminophen Do not take more than  3,000 mg per day      Day 5    Time  Name of Medication Number of pills taken  Amount of Acetaminophen  Pain Level   Comments  AM PM       AM PM       AM PM       AM PM       AM PM       AM PM       AM PM       AM PM       Total Daily amount of Acetaminophen Do not take more than  3,000 mg per day       Day 6    Time  Name of Medication Number of pills taken  Amount of Acetaminophen  Pain Level  Comments  AM PM       AM PM       AM PM       AM PM       AM PM       AM PM       AM PM       AM PM       Total Daily amount of Acetaminophen Do not take more than  3,000 mg per day      Day 7    Time  Name of Medication Number of pills taken  Amount of Acetaminophen  Pain Level   Comments  AM PM       AM PM       AM PM       AM PM       AM PM       AM PM       AM PM       AM PM       Total Daily amount of Acetaminophen Do not take more than  3,000 mg per day        For additional information about how and where to safely dispose of unused opioid medications - [employer]  Disclaimer: This document contains information and/or instructional materials adapted from [place] for the typical patient with your condition. It does not replace medical advice from your health care provider because your experience may differ from that of the typical patient. Talk to your health care provider if you have any questions about  this document, your condition or your treatment plan. Adapted from [facility]    No Tylenol before 6:30pm   Post Anesthesia Home Care Instructions  Activity: Get plenty of rest for the remainder of the day. A responsible individual must stay with you for 24 hours following the procedure.  For the next 24 hours, DO NOT: -Drive a car -Paediatric nurse -Drink alcoholic beverages -Take any medication unless instructed by your physician -Make any legal decisions or sign important papers.  Meals: Start with liquid foods such as gelatin or soup. Progress to regular foods as tolerated. Avoid greasy, spicy, heavy foods. If nausea and/or vomiting occur, drink only clear liquids until the nausea and/or vomiting subsides. Call your physician if vomiting continues.  Special Instructions/Symptoms: Your throat may feel dry or sore from the anesthesia or the breathing tube placed in your throat during surgery. If this causes discomfort, gargle with warm salt water. The discomfort should disappear within 24 hours.  If you had a scopolamine patch placed behind your ear for the management of post- operative nausea and/or vomiting:  1. The medication in the patch is effective for 72 hours, after which it should be removed.  Wrap patch in a tissue and discard in the trash. Wash hands thoroughly with soap and water. 2. You may remove the patch earlier than 72 hours if you experience unpleasant side effects which may include dry mouth, dizziness or visual disturbances. 3. Avoid touching the patch. Wash your hands with soap and water after contact with the patch.

## 2019-10-09 NOTE — Op Note (Signed)
Patient Name:           Carolyn Chaney   Date of Surgery:        10/09/2019  Pre op Diagnosis:      Complex sclerosing lesion right breast  Post op Diagnosis:    Same  Procedure:                 Right breast lumpectomy with radioactive seed localization  Surgeon:                     Edsel Petrin. Dalbert Batman, M.D., FACS  Assistant:                      OR staff  Operative Indications:     This is a 58 year old female who recently underwent core biopsy of right breast at 12 o'clock position which revealed complex sclerosing lesion. [name] is her PCP. She has seen Dr. Marin Olp in the past for management of anemia. She has seen Dr. Jolyn Nap for tachycardia arrhythmias.  She reported open biopsy of her right breast in 1985 for benign disease She underwent ultrasound-guided core biopsy of right breast at 7 o'clock position 2016 which showed a fibroadenoma. She says this area is still tender and painful. She has chronic pain in her right breast. Recent mammogram showed a focal area of distortion in the upper central right breast, 12 o'clock position, with calcifications. 1 cm above the nipple and somewhat retroareolar. This was felt to be suspicious. 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 Excision recommended. She's had a lot of pain since the core biopsy. She's had some bleeding. r. Family history negative for breast or ovarian cancer.      I advised her to have this area excised in 3 or 4 weeks once the hematoma settles down She agrees She asked about the tender fibroadenoma at 7:00 and I advised against excision of this at this time since the pain was not well explained   Operative Findings:       The marker clip and radioactive seed were in the retroareolar area, about  2 cm deep to the skin.  The breast tissues in this area had lots of milky and dark brown cloudy fluid in them suggesting multiple obstructed ducts.  There is no palpable mass to suggest cancer.  There was no abscess or infection.  The specimen mammogram looked good containing the seed and the marker clip.  Procedure in Detail:          Following the induction of general LMA anesthesia the patient's right breast was prepped and draped in a sterile fashion.  Surgical timeout was performed.  Intravenous antibiotics were given.  0.5% Marcaine was generously infiltrated into the central periareolar tissues, 20 cc were used.  There was an old scar at the superior areolar rim.  Using the neoprobe I could tell that the area was retroareolar.  I made a circumareolar incision through the old scar.  Lumpectomy was performed using electrocautery and the neoprobe frequently.  Once I got deep enough to get the radioactive signal out the specimen was removed and marked with silk sutures and a 6 color ink kit to orient the pathologist.  Specimen mammogram looked good.  The specimen  was sent to the lab where the seed was retrieved.       Hemostasis was excellent and achieved with electrocautery.  The wound was irrigated.  I placed 5 metal marker clips in the walls of the lumpectomy cavity to signify the lumpectomy site.  The breast tissues were closed in several layers with interrupted 3-0 Vicryl and the skin was closed with subcuticular 4-0 Monocryl and Dermabond.  Dry bandages and a breast binder were placed.  The patient tolerated the procedure well and was taken to PACU in stable condition.  EBL 20 cc.  Counts correct.  Complications none.     Addendum: I logged onto the PMP aware website and reviewed her prescription medication history     Rohail Klees M. Dalbert Batman, M.D., FACS General and Minimally Invasive Surgery Breast and Colorectal Surgery  10/09/2019 11:33 AM

## 2019-10-09 NOTE — Anesthesia Procedure Notes (Signed)
Procedure Name: LMA Insertion Date/Time: 10/09/2019 10:41 AM Performed by: Signe Colt, CRNA Pre-anesthesia Checklist: Patient identified, Emergency Drugs available, Suction available and Patient being monitored Patient Re-evaluated:Patient Re-evaluated prior to induction Oxygen Delivery Method: Circle system utilized Preoxygenation: Pre-oxygenation with 100% oxygen Induction Type: IV induction Ventilation: Mask ventilation without difficulty LMA: LMA inserted LMA Size: 4.0 Number of attempts: 1 Airway Equipment and Method: Bite block Placement Confirmation: positive ETCO2 Tube secured with: Tape Dental Injury: Teeth and Oropharynx as per pre-operative assessment

## 2019-10-10 ENCOUNTER — Encounter (HOSPITAL_BASED_OUTPATIENT_CLINIC_OR_DEPARTMENT_OTHER): Payer: Self-pay | Admitting: General Surgery

## 2019-10-11 LAB — SURGICAL PATHOLOGY

## 2019-11-22 ENCOUNTER — Other Ambulatory Visit: Payer: 59

## 2019-11-22 ENCOUNTER — Ambulatory Visit: Payer: 59 | Admitting: Hematology & Oncology

## 2020-03-23 ENCOUNTER — Other Ambulatory Visit: Payer: Self-pay

## 2020-03-23 MED ORDER — METOPROLOL SUCCINATE ER 25 MG PO TB24: 25.0000 mg | ORAL_TABLET | Freq: Every day | ORAL | 0 refills | Status: DC

## 2020-04-22 ENCOUNTER — Other Ambulatory Visit: Payer: Self-pay

## 2020-04-22 MED ORDER — METOPROLOL SUCCINATE ER 25 MG PO TB24: 25.0000 mg | ORAL_TABLET | Freq: Every day | ORAL | 0 refills | Status: DC

## 2020-05-27 ENCOUNTER — Ambulatory Visit: Payer: 59 | Admitting: Student

## 2020-06-11 ENCOUNTER — Other Ambulatory Visit: Payer: Self-pay

## 2020-06-11 ENCOUNTER — Encounter: Payer: Self-pay | Admitting: Student

## 2020-06-11 ENCOUNTER — Ambulatory Visit: Payer: 59 | Admitting: Student

## 2020-06-11 VITALS — BP 124/74 | HR 68 | Ht 66.0 in | Wt 149.0 lb

## 2020-06-11 DIAGNOSIS — I471 Supraventricular tachycardia: Secondary | ICD-10-CM

## 2020-06-11 MED ORDER — METOPROLOL SUCCINATE ER 25 MG PO TB24: 25.0000 mg | ORAL_TABLET | Freq: Every day | ORAL | 3 refills | Status: DC

## 2020-06-11 NOTE — Patient Instructions (Signed)
Medication Instructions:  *If you need a refill on your cardiac medications before your next appointment, please call your pharmacy*  Follow-Up: At [facility], you and your health needs are our priority.  As part of our continuing mission to provide you with exceptional heart care, we have created designated Provider Care Teams.  These Care Teams include your primary Cardiologist (physician) and Advanced Practice Providers (APPs -  Physician Assistants and Nurse Practitioners) who all work together to provide you with the care you need, when you need it.  We recommend signing up for the patient portal called "MyChart".  Sign up information is provided on this After Visit Summary.  MyChart is used to connect with patients for Virtual Visits (Telemedicine).  Patients are able to view lab/test results, encounter notes, upcoming appointments, etc.  Non-urgent messages can be sent to your provider as well.   To learn more about what you can do with MyChart, go to [email or web address].    Your next appointment:   Your physician wants you to follow-up in: 1 YEAR with Dr. Caryl Comes. You will receive a reminder letter in the mail two months in advance. If you don't receive a letter, please call our office to schedule the follow-up appointment.  The format for your next appointment:   In Person with Virl Axe, MD

## 2020-06-11 NOTE — Progress Notes (Signed)
PCP:  [name], MD Primary Cardiologist: Virl Axe, MD Electrophysiologist: Virl Axe, MD   Carolyn Chaney is a 59 y.o. female seen today for Virl Axe, MD for routine electrophysiology followup.  Since last being seen in our clinic the patient reports doing well overall. She has only occasional palpitations that are not marked or limiting. She takes her Toprol half a tablet most days, and will on occasion take another half a tablet in the mornings.  she denies chest pain, dyspnea, PND, orthopnea, nausea, vomiting, dizziness, syncope, edema, weight gain, or early satiety.  Past Medical History:  Diagnosis Date  . Anxiety   . Breast mass, right   . Chronic GI bleeding 01/27/2016  . Complication of anesthesia   . Depression   . Dysrhythmia    PVC,s, atrial tachycardia  . Gastric ulcer   . Iron deficiency anemia 11/07/2011  . Irregular heart rate   . PONV (postoperative nausea and vomiting)    Past Surgical History:  Procedure Laterality Date  . APPENDECTOMY    . BREAST EXCISIONAL BIOPSY Right 1980   Benign  . BREAST LUMPECTOMY WITH RADIOACTIVE SEED LOCALIZATION Right 10/09/2019   Procedure: RIGHT BREAST LUMPECTOMY WITH RADIOACTIVE SEED LOCALIZATION;  Surgeon: Fanny Skates, MD;  Location: [facility];  Service: General;  Laterality: Right;  . BREAST SURGERY     x2  . COLONOSCOPY WITH PROPOFOL N/A 02/16/2016   Procedure: COLONOSCOPY WITH PROPOFOL;  Surgeon: Garlan Fair, MD;  Location: [facility];  Service: Endoscopy;  Laterality: N/A;  . ESOPHAGOGASTRODUODENOSCOPY (EGD) WITH PROPOFOL N/A 02/16/2016   Procedure: ESOPHAGOGASTRODUODENOSCOPY (EGD) WITH PROPOFOL;  Surgeon: Garlan Fair, MD;  Location: [facility];  Service: Endoscopy;  Laterality: N/A;  . HERNIA REPAIR     bilateral inguinal hernia as child  . TONSILLECTOMY      Current Outpatient Medications  Medication Sig Dispense Refill  . acetaminophen (TYLENOL) 500 MG tablet Take 500 mg  by mouth every 6 (six) hours as needed for mild pain.    Marland Kitchen ALPRAZolam (XANAX) 0.5 MG tablet Take 0.25 mg by mouth daily as needed for anxiety.     . Ascorbic Acid (VITAMIN C) 500 MG CAPS Take 500 capsules by mouth daily.    Marland Kitchen ascorbic acid (VITAMIN C) 500 MG tablet Take 500 mg by mouth daily.    . cholecalciferol (VITAMIN D) 1000 units tablet Take 1,000 Units by mouth daily.    . metoprolol succinate (TOPROL-XL) 25 MG 24 hr tablet Take 1 tablet (25 mg total) by mouth daily. 90 tablet 3  . sertraline (ZOLOFT) 100 MG tablet Take 100 mg by mouth daily.    . vitamin B-12 (CYANOCOBALAMIN) 1000 MCG tablet Take 1,000 mcg by mouth daily.     No current facility-administered medications for this visit.    Allergies  Allergen Reactions  . Nsaids Other (See Comments)    Bleeding Bleeding    Social History   Socioeconomic History  . Marital status: Married    Spouse name: Not on file  . Number of children: Not on file  . Years of education: Not on file  . Highest education level: Not on file  Occupational History  . Not on file  Tobacco Use  . Smoking status: Never Smoker  . Smokeless tobacco: Never Used  Substance and Sexual Activity  . Alcohol use: Yes    Alcohol/week: 0.0 standard drinks    Comment: ocassionally  . Drug use: No  . Sexual activity: Not  on file  Other Topics Concern  . Not on file  Social History Narrative  . Not on file   Social Determinants of Health   Financial Resource Strain:   . Difficulty of Paying Living Expenses:   Food Insecurity:   . Worried About Charity fundraiser in the Last Year:   . Arboriculturist in the Last Year:   Transportation Needs:   . Film/video editor (Medical):   Marland Kitchen Lack of Transportation (Non-Medical):   Physical Activity:   . Days of Exercise per Week:   . Minutes of Exercise per Session:   Stress:   . Feeling of Stress :   Social Connections:   . Frequency of Communication with Friends and Family:   . Frequency of  Social Gatherings with Friends and Family:   . Attends Religious Services:   . Active Member of Clubs or Organizations:   . Attends Archivist Meetings:   Marland Kitchen Marital Status:   Intimate Partner Violence:   . Fear of Current or Ex-Partner:   . Emotionally Abused:   Marland Kitchen Physically Abused:   . Sexually Abused:     Review of Systems: General: No chills, fever, night sweats or weight changes  Cardiovascular:  No chest pain, dyspnea on exertion, edema, orthopnea, palpitations, paroxysmal nocturnal dyspnea Dermatological: No rash, lesions or masses Respiratory: No cough, dyspnea Urologic: No hematuria, dysuria Abdominal: No nausea, vomiting, diarrhea, bright red blood per rectum, melena, or hematemesis Neurologic: No visual changes, weakness, changes in mental status All other systems reviewed and are otherwise negative except as noted above.  Physical Exam: Vitals:   06/11/20 1140  BP: 124/74  Pulse: 68  SpO2: 99%  Weight: 149 lb (67.6 kg)  Height: 5' 6"  (1.676 m)    GEN- The patient is well appearing, alert and oriented x 3 today.   HEENT: normocephalic, atraumatic; sclera clear, conjunctiva pink; hearing intact; oropharynx clear; neck supple, no JVP Lymph- no cervical lymphadenopathy Lungs- Clear to ausculation bilaterally, normal work of breathing.  No wheezes, rales, rhonchi Heart- Regular rate and rhythm, no murmurs, rubs or gallops, PMI not laterally displaced GI- soft, non-tender, non-distended, bowel sounds present, no hepatosplenomegaly Extremities- no clubbing, cyanosis, or edema; DP/PT/radial pulses 2+ bilaterally MS- no significant deformity or atrophy Skin- warm and dry, no rash or lesion Psych- euthymic mood, full affect Neuro- strength and sensation are intact  EKG is ordered. Personal review of EKG from today shows NSR at 69 bpm with normal intervals  Additional studies reviewed include: Previous EP office notes  Assessment and Plan:  1. Atrial  tachycardia/SVT Well controlled on lopressor. She can take 12.5 mg as needed as she has been doing with good results.  Labs stable on recently scanned reports  Previously discussed ablation vs flecainide. Pt deferred flecainide due to concern over side effects.  Continued to encourage her that SVT is typically not considered life threatening in patients with otherwise structurally normal hearts (Normal Myoview 2004)  RTC 12 months. Sooner with symptoms.   Shirley Friar, PA-C  06/11/20 12:20 PM

## 2020-07-23 ENCOUNTER — Other Ambulatory Visit: Payer: Self-pay | Admitting: Family

## 2020-07-23 ENCOUNTER — Other Ambulatory Visit: Payer: Self-pay

## 2020-07-23 ENCOUNTER — Inpatient Hospital Stay (HOSPITAL_BASED_OUTPATIENT_CLINIC_OR_DEPARTMENT_OTHER): Payer: 59 | Admitting: Family

## 2020-07-23 ENCOUNTER — Telehealth: Payer: Self-pay | Admitting: Family

## 2020-07-23 ENCOUNTER — Inpatient Hospital Stay: Payer: 59 | Attending: Hematology & Oncology

## 2020-07-23 ENCOUNTER — Encounter: Payer: Self-pay | Admitting: Family

## 2020-07-23 VITALS — BP 125/73 | HR 75 | Temp 98.3°F | Resp 17 | Wt 147.0 lb

## 2020-07-23 DIAGNOSIS — D500 Iron deficiency anemia secondary to blood loss (chronic): Secondary | ICD-10-CM

## 2020-07-23 DIAGNOSIS — K922 Gastrointestinal hemorrhage, unspecified: Secondary | ICD-10-CM | POA: Insufficient documentation

## 2020-07-23 LAB — CBC WITH DIFFERENTIAL (CANCER CENTER ONLY)
Abs Immature Granulocytes: 0.01 10*3/uL (ref 0.00–0.07)
Basophils Absolute: 0.1 10*3/uL (ref 0.0–0.1)
Basophils Relative: 1 %
Eosinophils Absolute: 0.1 10*3/uL (ref 0.0–0.5)
Eosinophils Relative: 2 %
HCT: 29.9 % — ABNORMAL LOW (ref 36.0–46.0)
Hemoglobin: 8.8 g/dL — ABNORMAL LOW (ref 12.0–15.0)
Immature Granulocytes: 0 %
Lymphocytes Relative: 24 %
Lymphs Abs: 1.2 10*3/uL (ref 0.7–4.0)
MCH: 25.4 pg — ABNORMAL LOW (ref 26.0–34.0)
MCHC: 29.4 g/dL — ABNORMAL LOW (ref 30.0–36.0)
MCV: 86.2 fL (ref 80.0–100.0)
Monocytes Absolute: 0.3 10*3/uL (ref 0.1–1.0)
Monocytes Relative: 7 %
Neutro Abs: 3.4 10*3/uL (ref 1.7–7.7)
Neutrophils Relative %: 66 %
Platelet Count: 290 10*3/uL (ref 150–400)
RBC: 3.47 MIL/uL — ABNORMAL LOW (ref 3.87–5.11)
RDW: 13.7 % (ref 11.5–15.5)
WBC Count: 5.1 10*3/uL (ref 4.0–10.5)
nRBC: 0 % (ref 0.0–0.2)

## 2020-07-23 LAB — CMP (CANCER CENTER ONLY)
ALT: 15 U/L (ref 0–44)
AST: 14 U/L — ABNORMAL LOW (ref 15–41)
Albumin: 4.5 g/dL (ref 3.5–5.0)
Alkaline Phosphatase: 77 U/L (ref 38–126)
Anion gap: 7 (ref 5–15)
BUN: 22 mg/dL — ABNORMAL HIGH (ref 6–20)
CO2: 29 mmol/L (ref 22–32)
Calcium: 9.8 mg/dL (ref 8.9–10.3)
Chloride: 105 mmol/L (ref 98–111)
Creatinine: 1.14 mg/dL — ABNORMAL HIGH (ref 0.44–1.00)
GFR, Est AFR Am: >60 mL/min (ref >60)
GFR, Estimated: 53 mL/min — ABNORMAL LOW (ref >60)
Glucose, Bld: 89 mg/dL (ref 70–99)
Potassium: 4.2 mmol/L (ref 3.5–5.1)
Sodium: 141 mmol/L (ref 135–145)
Total Bilirubin: 0.3 mg/dL (ref 0.3–1.2)
Total Protein: 6.9 g/dL (ref 6.5–8.1)

## 2020-07-23 NOTE — Progress Notes (Signed)
Hematology and Oncology Follow Up Visit  Carolyn Chaney [id number] 06-19-1961 59 y.o. 07/23/2020   Principle Diagnosis:  Iron deficiency anemia Ductal Hyperplasia of the RIGHT        Current Therapy:  IV iron as indicated  Right breast lumpectomy with radioactive seed localization on 10/09/2019   Interim History:  Carolyn Chaney is here today with complaint of fatigue, weakness, intermittent dizziness, occasional palpitations and a near syncopal episode last week.  She went in to see her gynecologist and was found to have a Hgb of 8.3.  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  She ha started taking Advil again for headaches.  We will have her bring in a stool sample for occult blood testing.    No bruising or petechiae.  No fever, chills, n/v, cough, rash, chest pain, abdominal pain or changes in bowel habits.  No falls but she states that she did almost pass out at home after becoming dizzy and diaphoretic. She called EMS but her work up was negative and symptoms subsided without intervention.  She weaned off of her estrogen supplement after her diagnosis of ductal hyperplasia of the right breast last fall. She has noted that since then she has worsened urinary frequency, pain during sex and hot flashes. She has spoken with her gynecologist and they plan to try the lowest dose of vaginal estrogen cream. She feels that the benefits will outweigh the risks.  She denies swelling, tenderness, numbness or tingling in her extremities.  She has maintained a good appetite and is staying well hydrated. Her weight is stable.   ECOG Performance Status: 1 - Symptomatic but completely ambulatory  Medications:  Allergies as of 07/23/2020      Reactions   Nsaids Other (See Comments)   Bleeding Bleeding      Medication List       Accurate as of July 23, 2020  2:25  PM. If you have any questions, ask your nurse or doctor.        acetaminophen 500 MG tablet Commonly known as: TYLENOL Take 500 mg by mouth every 6 (six) hours as needed for mild pain.   ALPRAZolam 0.5 MG tablet Commonly known as: XANAX Take 0.25 mg by mouth daily as needed for anxiety.   ascorbic acid 500 MG tablet Commonly known as: VITAMIN C Take 500 mg by mouth daily.   Vitamin C 500 MG Caps Take 500 capsules by mouth daily.   cholecalciferol 1000 units tablet Commonly known as: VITAMIN D Take 1,000 Units by mouth daily.   metoprolol succinate 25 MG 24 hr tablet Commonly known as: TOPROL-XL Take 1 tablet (25 mg total) by mouth daily. What changed: additional instructions   sertraline 100 MG tablet Commonly known as: ZOLOFT Take 100 mg by mouth daily.   vitamin B-12 1000 MCG tablet Commonly known as: CYANOCOBALAMIN Take 1,000 mcg by mouth daily.       Allergies:  Allergies  Allergen Reactions  . Nsaids Other (See Comments)    Bleeding Bleeding    Past Medical History, Surgical history, Social history, and Family History were reviewed and updated.  Review of Systems: All other 10 point review of systems is negative.   Physical Exam:  weight is 147 lb (66.7 kg). Her oral temperature is 98.3 F (36.8 C). Her blood pressure is 125/73 and her  pulse is 75. Her respiration is 17 and oxygen saturation is 100%.   Wt Readings from Last 3 Encounters:  07/23/20 147 lb (66.7 kg)  06/11/20 149 lb (67.6 kg)  10/09/19 146 lb 9.7 oz (66.5 kg)    Ocular: Sclerae unicteric, pupils equal, round and reactive to light Ear-nose-throat: Oropharynx clear, dentition fair Lymphatic: No cervical or supraclavicular adenopathy Lungs no rales or rhonchi, good excursion bilaterally Heart regular rate and rhythm, no murmur appreciated Abd soft, nontender, positive bowel sounds, no liver or spleen tip palpated on exam, no fluid wave  MSK no focal spinal tenderness, no joint  edema Neuro: non-focal, well-oriented, appropriate affect Breasts: Deferred   Lab Results  Component Value Date   WBC 5.1 07/23/2020   HGB 8.8 (L) 07/23/2020   HCT 29.9 (L) 07/23/2020   MCV 86.2 07/23/2020   PLT 290 07/23/2020   Lab Results  Component Value Date   FERRITIN 30 09/20/2019   IRON 92 09/20/2019   TIBC 330 09/20/2019   UIBC 238 09/20/2019   IRONPCTSAT 28 09/20/2019   Lab Results  Component Value Date   RETICCTPCT 1.1 09/20/2019   RBC 3.47 (L) 07/23/2020   RETICCTABS 29.25 (L) 03/08/2017   No results found for: KPAFRELGTCHN, LAMBDASER, KAPLAMBRATIO No results found for: IGGSERUM, IGA, IGMSERUM No results found for: Odetta Pink, SPEI   Chemistry      Component Value Date/Time   NA 141 09/20/2019 1149   K 4.6 09/20/2019 1149   CL 103 09/20/2019 1149   CO2 32 09/20/2019 1149   BUN 18 09/20/2019 1149   CREATININE 1.03 (H) 09/20/2019 1149      Component Value Date/Time   CALCIUM 9.8 09/20/2019 1149   ALKPHOS 75 09/20/2019 1149   AST 15 09/20/2019 1149   ALT 14 09/20/2019 1149   BILITOT 0.4 09/20/2019 1149       Impression and Plan: Carolyn Chaney is a very pleasant 59 yo caucasian female with history of iron deficiency anemia secondary to intermittent GI blood loss as well as ductal hyperplasia of the right breast last fall treated with lumpectomy.  Her iron saturation is down to 6% and ferritin < 4. Hgb is stable at 8.8, MCV 86.m No blood transfusion needed at this time. She is fatigued but not in any distress at this time.  We will get a stool specimen from her to test for occult blood.  We will go ahead and get her referred to GI here with [place] for further work up. She states that her previous gastroenterologist retired.  She will stop all NSAIDs at this time.  We will get her set up for 5 doses of Venofer and pre medicate with benadryl and pepcid per her request. She states that she felt "bad"  after receiving IV iron in 2017.  We will see her again in 8 weeks.  She will contact our office with any questions or concerns.   [name], NP 8/12/20212:25 PM

## 2020-07-23 NOTE — Telephone Encounter (Signed)
I called and spoke with patient about appointments scheduled per 8/12 staff message.  She was ok with both date/time for today

## 2020-07-24 ENCOUNTER — Inpatient Hospital Stay: Payer: 59

## 2020-07-24 VITALS — BP 116/52 | HR 71 | Temp 98.4°F | Resp 17

## 2020-07-24 DIAGNOSIS — D500 Iron deficiency anemia secondary to blood loss (chronic): Secondary | ICD-10-CM | POA: Diagnosis not present

## 2020-07-24 DIAGNOSIS — K922 Gastrointestinal hemorrhage, unspecified: Secondary | ICD-10-CM

## 2020-07-24 LAB — FERRITIN: Ferritin: <4 ng/mL — ABNORMAL LOW (ref 11–307)

## 2020-07-24 LAB — IRON AND TIBC
Iron: 29 ug/dL — ABNORMAL LOW (ref 41–142)
Saturation Ratios: 6 % — ABNORMAL LOW (ref 21–57)
TIBC: 474 ug/dL — ABNORMAL HIGH (ref 236–444)
UIBC: 445 ug/dL — ABNORMAL HIGH (ref 120–384)

## 2020-07-24 LAB — SAMPLE TO BLOOD BANK

## 2020-07-24 MED ORDER — SODIUM CHLORIDE 0.9 % IV SOLN
200.0000 mg | Freq: Once | INTRAVENOUS | Status: AC
  Administered 2020-07-24: 200 mg via INTRAVENOUS
  Filled 2020-07-24: qty 200

## 2020-07-24 MED ORDER — SODIUM CHLORIDE 0.9 % IV SOLN
Freq: Once | INTRAVENOUS | Status: AC
  Filled 2020-07-24: qty 250

## 2020-07-24 MED ORDER — FAMOTIDINE IN NACL 20-0.9 MG/50ML-% IV SOLN
20.0000 mg | Freq: Once | INTRAVENOUS | Status: AC
  Administered 2020-07-24: 20 mg via INTRAVENOUS

## 2020-07-24 MED ORDER — DIPHENHYDRAMINE HCL 25 MG PO CAPS: 25.0000 mg | ORAL_CAPSULE | Freq: Once | ORAL | Status: DC

## 2020-07-24 MED ORDER — FAMOTIDINE IN NACL 20-0.9 MG/50ML-% IV SOLN
INTRAVENOUS | Status: AC
  Filled 2020-07-24: qty 50

## 2020-07-24 NOTE — Patient Instructions (Signed)

## 2020-07-28 ENCOUNTER — Inpatient Hospital Stay: Payer: 59

## 2020-07-28 ENCOUNTER — Other Ambulatory Visit: Payer: Self-pay

## 2020-07-28 VITALS — BP 130/73 | HR 67 | Temp 98.0°F | Resp 17

## 2020-07-28 DIAGNOSIS — K922 Gastrointestinal hemorrhage, unspecified: Secondary | ICD-10-CM

## 2020-07-28 DIAGNOSIS — D500 Iron deficiency anemia secondary to blood loss (chronic): Secondary | ICD-10-CM

## 2020-07-28 MED ORDER — FAMOTIDINE IN NACL 20-0.9 MG/50ML-% IV SOLN
20.0000 mg | Freq: Once | INTRAVENOUS | Status: AC
  Administered 2020-07-28: 20 mg via INTRAVENOUS

## 2020-07-28 MED ORDER — FAMOTIDINE IN NACL 20-0.9 MG/50ML-% IV SOLN
INTRAVENOUS | Status: AC
  Filled 2020-07-28: qty 50

## 2020-07-28 MED ORDER — DIPHENHYDRAMINE HCL 25 MG PO CAPS: 25.0000 mg | ORAL_CAPSULE | Freq: Once | ORAL | Status: DC

## 2020-07-28 MED ORDER — SODIUM CHLORIDE 0.9 % IV SOLN
Freq: Once | INTRAVENOUS | Status: AC
  Filled 2020-07-28: qty 250

## 2020-07-28 MED ORDER — SODIUM CHLORIDE 0.9 % IV SOLN
200.0000 mg | Freq: Once | INTRAVENOUS | Status: AC
  Administered 2020-07-28: 200 mg via INTRAVENOUS
  Filled 2020-07-28: qty 200

## 2020-07-28 NOTE — Patient Instructions (Signed)

## 2020-08-04 ENCOUNTER — Inpatient Hospital Stay: Payer: 59

## 2020-08-04 ENCOUNTER — Other Ambulatory Visit: Payer: Self-pay

## 2020-08-04 VITALS — BP 129/74 | HR 70 | Temp 98.2°F | Resp 18

## 2020-08-04 DIAGNOSIS — D500 Iron deficiency anemia secondary to blood loss (chronic): Secondary | ICD-10-CM

## 2020-08-04 DIAGNOSIS — K922 Gastrointestinal hemorrhage, unspecified: Secondary | ICD-10-CM

## 2020-08-04 MED ORDER — SODIUM CHLORIDE 0.9 % IV SOLN
200.0000 mg | Freq: Once | INTRAVENOUS | Status: AC
  Administered 2020-08-04: 200 mg via INTRAVENOUS
  Filled 2020-08-04: qty 200

## 2020-08-04 MED ORDER — DIPHENHYDRAMINE HCL 25 MG PO CAPS: 25.0000 mg | ORAL_CAPSULE | Freq: Once | ORAL | Status: DC

## 2020-08-04 MED ORDER — FAMOTIDINE IN NACL 20-0.9 MG/50ML-% IV SOLN
INTRAVENOUS | Status: AC
  Filled 2020-08-04: qty 50

## 2020-08-04 MED ORDER — SODIUM CHLORIDE 0.9 % IV SOLN
Freq: Once | INTRAVENOUS | Status: AC
  Filled 2020-08-04: qty 250

## 2020-08-04 MED ORDER — FAMOTIDINE IN NACL 20-0.9 MG/50ML-% IV SOLN
20.0000 mg | Freq: Once | INTRAVENOUS | Status: AC
  Administered 2020-08-04: 20 mg via INTRAVENOUS

## 2020-08-04 NOTE — Patient Instructions (Signed)

## 2020-08-07 ENCOUNTER — Inpatient Hospital Stay: Payer: 59

## 2020-08-07 ENCOUNTER — Ambulatory Visit: Payer: 59

## 2020-08-11 ENCOUNTER — Inpatient Hospital Stay: Payer: 59

## 2020-08-11 VITALS — BP 127/56 | HR 70 | Temp 98.3°F | Resp 18

## 2020-08-11 DIAGNOSIS — K922 Gastrointestinal hemorrhage, unspecified: Secondary | ICD-10-CM

## 2020-08-11 DIAGNOSIS — D500 Iron deficiency anemia secondary to blood loss (chronic): Secondary | ICD-10-CM

## 2020-08-11 MED ORDER — SODIUM CHLORIDE 0.9 % IV SOLN
200.0000 mg | Freq: Once | INTRAVENOUS | Status: AC
  Administered 2020-08-11: 200 mg via INTRAVENOUS
  Filled 2020-08-11: qty 200

## 2020-08-11 MED ORDER — FAMOTIDINE IN NACL 20-0.9 MG/50ML-% IV SOLN
20.0000 mg | Freq: Once | INTRAVENOUS | Status: AC
  Administered 2020-08-11: 20 mg via INTRAVENOUS

## 2020-08-11 MED ORDER — FAMOTIDINE IN NACL 20-0.9 MG/50ML-% IV SOLN
INTRAVENOUS | Status: AC
  Filled 2020-08-11: qty 50

## 2020-08-11 MED ORDER — DIPHENHYDRAMINE HCL 25 MG PO CAPS: 25.0000 mg | ORAL_CAPSULE | Freq: Once | ORAL | Status: DC

## 2020-08-11 MED ORDER — SODIUM CHLORIDE 0.9 % IV SOLN
Freq: Once | INTRAVENOUS | Status: AC
  Filled 2020-08-11: qty 250

## 2020-08-11 NOTE — Patient Instructions (Signed)

## 2020-08-18 ENCOUNTER — Inpatient Hospital Stay: Payer: 59 | Attending: Hematology & Oncology

## 2020-08-18 ENCOUNTER — Other Ambulatory Visit: Payer: Self-pay

## 2020-08-18 VITALS — BP 126/66 | HR 63 | Temp 98.0°F | Resp 18

## 2020-08-18 DIAGNOSIS — D500 Iron deficiency anemia secondary to blood loss (chronic): Secondary | ICD-10-CM | POA: Diagnosis not present

## 2020-08-18 DIAGNOSIS — K922 Gastrointestinal hemorrhage, unspecified: Secondary | ICD-10-CM | POA: Diagnosis present

## 2020-08-18 MED ORDER — SODIUM CHLORIDE 0.9 % IV SOLN
200.0000 mg | Freq: Once | INTRAVENOUS | Status: AC
  Administered 2020-08-18: 200 mg via INTRAVENOUS
  Filled 2020-08-18: qty 200

## 2020-08-18 MED ORDER — SODIUM CHLORIDE 0.9 % IV SOLN
Freq: Once | INTRAVENOUS | Status: AC
  Filled 2020-08-18: qty 250

## 2020-08-18 MED ORDER — FAMOTIDINE IN NACL 20-0.9 MG/50ML-% IV SOLN
20.0000 mg | Freq: Once | INTRAVENOUS | Status: AC
  Administered 2020-08-18: 20 mg via INTRAVENOUS

## 2020-08-18 MED ORDER — DIPHENHYDRAMINE HCL 25 MG PO CAPS
ORAL_CAPSULE | ORAL | Status: AC
  Filled 2020-08-18: qty 1

## 2020-08-18 MED ORDER — FAMOTIDINE IN NACL 20-0.9 MG/50ML-% IV SOLN
INTRAVENOUS | Status: AC
  Filled 2020-08-18: qty 50

## 2020-08-18 MED ORDER — DIPHENHYDRAMINE HCL 25 MG PO CAPS
25.0000 mg | ORAL_CAPSULE | Freq: Once | ORAL | Status: AC
  Administered 2020-08-18: 25 mg via ORAL

## 2020-09-16 ENCOUNTER — Ambulatory Visit: Payer: 59 | Admitting: Gastroenterology

## 2020-09-16 ENCOUNTER — Encounter: Payer: Self-pay | Admitting: Gastroenterology

## 2020-09-16 VITALS — BP 118/74 | HR 71 | Ht 66.0 in | Wt 148.0 lb

## 2020-09-16 DIAGNOSIS — K56699 Other intestinal obstruction unspecified as to partial versus complete obstruction: Secondary | ICD-10-CM | POA: Diagnosis not present

## 2020-09-16 DIAGNOSIS — D649 Anemia, unspecified: Secondary | ICD-10-CM | POA: Diagnosis not present

## 2020-09-16 DIAGNOSIS — R195 Other fecal abnormalities: Secondary | ICD-10-CM

## 2020-09-16 DIAGNOSIS — D509 Iron deficiency anemia, unspecified: Secondary | ICD-10-CM

## 2020-09-16 MED ORDER — CLENPIQ 10-3.5-12 MG-GM -GM/160ML PO SOLN: 1.0000 | ORAL | 0 refills | Status: DC

## 2020-09-16 NOTE — Progress Notes (Signed)
Chief Complaint: Hematochezia, iron deficiency anemia  Referring Provider:     Vergia Alcon Holli Humbles, NP   HPI:     Carolyn Chaney is a 59 y.o. female referred to the Gastroenterology Clinic for evaluation of hematochezia and iron deficiency anemia.  Has a longstanding history of intermittent hematochezia for the last 20+ years.  Has had an evaluation for this and IDA as outlined below, to include ileitis on CT in 2012 and erosion in proximal colon on colonoscopy 2003 and 2017, that she reports was attributed to [facility] powder.  -07/23/2020: Ferritin <4, iron 29, TIBC 474, sat 6%.  H/H 8.8/29.9 with MCV/RDW 86/30.7.  BUN/creatinine 22/1.14 otherwise normal CMP -02/05/2020: H/H 11.9/37.5, MCV 96.6.  Vitamin D 28.4. -09/20/2019: Ferritin 30, iron 92, TIBC 330, sat 20%, H/H 13/41, MCV/RDW 96.5/12  -On review of historical labs, intermittent iron deficiency anemia dating back to 09/2011. Has had intermittent IV iron infusions over the years.   No NSAIDs since 2013 until recently took intemrittently for back pain.   Hx of Vit D deficiency; takes Vit D supplement.   Sister with rectal CA.  Was evaluated with EGD/colonoscopy in 02/2016 for evaluation of IDA  Today, she presents with recurrence of significant IDA as outlined above with symptoms to include fatigue, weakness, intermittent dizziness.  Has been taking Advil for headaches.  Was seen in the Hematology clinic on 07/23/2020, with labs n/f IDA and FOBT+; treated with IV iron.   Has had suspected intermittent bleeding for 20+ years. No overt hematochezia, but does report intermittent dark stools.   Endoscopic/GI Evaluation history: -Per patient reports history of NSAID induced ulcers with bowel obstruction 2/2 stricture in ~1998. Ex-lap with appendectomy performed. UGI series at that time n/f SB obstruction per patient, and treated with NSAID cessation and conservative management. She had extensive w/u for migraines, but  eventuallly resorted to NSAIDs again as the only effective tx. -Colonoscopy (11/2002): Normal except for erosion in the cecum.  Random biopsies demonstrate active colitis without chronic changes -EGD (2007): Normal including normal small bowel biopsies -Colonoscopy (01/15/2010): Normal.  Rectal hyperplastic polyps.  Repeat 10 years -09/16/2011: Ferritin 6 -CT enterography (09/2011): Distal ileum with several short segments of bowel wall thickening and mucosal enhancement (ileitis).  No obstruction -EGD (02/16/2016, Dr. Howell Rucks): Normal.  Biopsies negative for celiac -Colonoscopy (02/16/2016, Dr. Howell Rucks): Shallow erosion at IC valve (biopsy: Nonspecific ulcer), Otherwise normal colon.  Unable to intubate IC valve and examined terminal ileum    Past Medical History:  Diagnosis Date  . Anxiety   . Breast mass, right   . Chronic GI bleeding 01/27/2016  . Complication of anesthesia   . Depression   . Dysrhythmia    PVC,s, atrial tachycardia  . Gastric ulcer   . Hiatal hernia   . Iron deficiency anemia 11/07/2011  . Irregular heart rate   . PONV (postoperative nausea and vomiting)   . SBO (small bowel obstruction) ([place])    in 2 different places per patient      Past Surgical History:  Procedure Laterality Date  . APPENDECTOMY    . BREAST EXCISIONAL BIOPSY Right 1980   Benign  . BREAST LUMPECTOMY WITH RADIOACTIVE SEED LOCALIZATION Right 10/09/2019   Procedure: RIGHT BREAST LUMPECTOMY WITH RADIOACTIVE SEED LOCALIZATION;  Surgeon: Fanny Skates, MD;  Location: [place];  Service: General;  Laterality: Right;  . BREAST SURGERY     x2  .  COLONOSCOPY WITH PROPOFOL N/A 02/16/2016   Procedure: COLONOSCOPY WITH PROPOFOL;  Surgeon: Garlan Fair, MD;  Location: [facility];  Service: Endoscopy;  Laterality: N/A;  . ESOPHAGOGASTRODUODENOSCOPY (EGD) WITH PROPOFOL N/A 02/16/2016   Procedure: ESOPHAGOGASTRODUODENOSCOPY (EGD) WITH PROPOFOL;  Surgeon: Garlan Fair, MD;   Location: [facility];  Service: Endoscopy;  Laterality: N/A;  . HERNIA REPAIR     bilateral inguinal hernia as child  . TONSILLECTOMY     Family History  Problem Relation Age of Onset  . Lung cancer Mother   . Hiatal hernia Mother   . Heart attack Father   . Heart disease Father   . Heart attack Sister   . Rectal cancer Sister   . Diabetes Sister        type 2  . Esophageal cancer Neg Hx    Social History   Tobacco Use  . Smoking status: Never Smoker  . Smokeless tobacco: Never Used  Vaping Use  . Vaping Use: Never used  Substance Use Topics  . Alcohol use: Yes    Alcohol/week: 0.0 standard drinks    Comment: ocassionally  . Drug use: No   Current Outpatient Medications  Medication Sig Dispense Refill  . acetaminophen (TYLENOL) 500 MG tablet Take 500 mg by mouth every 6 (six) hours as needed for mild pain.    Marland Kitchen ALPRAZolam (XANAX) 0.5 MG tablet Take 0.25 mg by mouth daily as needed for anxiety.     Marland Kitchen ascorbic acid (VITAMIN C) 500 MG tablet Take 500 mg by mouth daily as needed.     . cholecalciferol (VITAMIN D) 1000 units tablet Take 1,000 Units by mouth daily as needed.     . metoprolol succinate (TOPROL-XL) 25 MG 24 hr tablet Take 1 tablet (25 mg total) by mouth daily. (Patient taking differently: Take 25 mg by mouth daily. Take 1/2 a tablet by mouth daily) 90 tablet 3  . sertraline (ZOLOFT) 100 MG tablet Take 150 mg by mouth daily.     . vitamin B-12 (CYANOCOBALAMIN) 1000 MCG tablet Take 1,000 mcg by mouth daily as needed.      No current facility-administered medications for this visit.   Facility-Administered Medications Ordered in Other Visits  Medication Dose Route Frequency Provider Last Rate Last Admin  . diphenhydrAMINE (BENADRYL) capsule 25 mg  25 mg Oral [place], NP       Allergies  Allergen Reactions  . Nsaids Other (See Comments)    Bleeding Bleeding     Review of Systems: All systems reviewed and negative except where noted in  HPI.     Physical Exam:    Wt Readings from Last 3 Encounters:  09/16/20 148 lb (67.1 kg)  07/23/20 147 lb (66.7 kg)  06/11/20 149 lb (67.6 kg)    BP 118/74   Pulse 71   Ht 5' 6"  (1.676 m)   Wt 148 lb (67.1 kg)   BMI 23.89 kg/m  Constitutional:  Pleasant, in no acute distress. Psychiatric: Normal mood and affect. Behavior is normal. EENT: Pupils normal.  Conjunctivae are normal. No scleral icterus. Neck supple. No cervical LAD. Cardiovascular: Normal rate, regular rhythm. No edema Pulmonary/chest: Effort normal and breath sounds normal. No wheezing, rales or rhonchi. Abdominal: Soft, nondistended, nontender. Bowel sounds active throughout. There are no masses palpable. No hepatomegaly. Neurological: Alert and oriented to person place and time. Skin: Skin is warm and dry. No rashes noted.   ASSESSMENT AND PLAN;   1) Iron deficiency anemia  2) History of small bowel obstruction 3) History of proximal colon erosion/ulcer 4) Symptomatic anemia 5) FOBT positive stool  -EGD/push enteroscopy -Colonoscopy with goal of TI intubation -If EGD/colonoscopy unrevealing, plan for further small bowel interrogation with either repeat CT enterography or MR enterography. -Would plan for imaging of small bowel prior to VCE given previous concern for small bowel stricture and obstruction.  Did discuss the possibility of performing patency capsule prior to VCE if needed  The indications, risks, and benefits of EGD/push enteroscopy and colonoscopy were explained to the patient in detail. Risks include but are not limited to bleeding, perforation, adverse reaction to medications, and cardiopulmonary compromise. Sequelae include but are not limited to the possibility of surgery, hositalization, and mortality. The patient verbalized understanding and wished to proceed. All questions answered, referred to scheduler and bowel prep ordered. Further recommendations pending results of the exam.    Lavena Bullion, DO, FACG  09/16/2020, 10:01 AM   [place], Holli Humbles, NP

## 2020-09-16 NOTE — Patient Instructions (Addendum)
If you are age 59 or older, your body mass index should be between 23-30. Your Body mass index is 23.89 kg/m. If this is out of the aforementioned range listed, please consider follow up with your Primary Care Provider.  If you are age 73 or younger, your body mass index should be between 19-25. Your Body mass index is 23.89 kg/m. If this is out of the aformentioned range listed, please consider follow up with your Primary Care Provider.   You have been scheduled for an endoscopy and colonoscopy. Please follow the written instructions given to you at your visit today. Please pick up your prep supplies at the pharmacy within the next 1-3 days. If you use inhalers (even only as needed), please bring them with you on the day of your procedure.  We have sent the following medications to your pharmacy for you to pick up at your convenience: Clenpiq  It was a pleasure to see you today!  Vito Cirigliano, D.O.

## 2020-09-22 ENCOUNTER — Inpatient Hospital Stay: Payer: 59

## 2020-09-22 ENCOUNTER — Inpatient Hospital Stay: Payer: 59 | Admitting: Family

## 2020-09-23 ENCOUNTER — Telehealth: Payer: Self-pay | Admitting: Family

## 2020-09-23 NOTE — Telephone Encounter (Signed)
Patient called 10/13 @ 12:00 pm to reschedule her missed appointments from 10/12.  Appointments moved per her request

## 2020-09-28 ENCOUNTER — Ambulatory Visit: Payer: 59 | Admitting: Family

## 2020-09-28 ENCOUNTER — Other Ambulatory Visit: Payer: 59

## 2020-10-01 ENCOUNTER — Inpatient Hospital Stay (HOSPITAL_BASED_OUTPATIENT_CLINIC_OR_DEPARTMENT_OTHER): Payer: 59 | Admitting: Family

## 2020-10-01 ENCOUNTER — Other Ambulatory Visit: Payer: Self-pay

## 2020-10-01 ENCOUNTER — Inpatient Hospital Stay: Payer: 59 | Attending: Hematology & Oncology

## 2020-10-01 ENCOUNTER — Encounter: Payer: Self-pay | Admitting: Gastroenterology

## 2020-10-01 ENCOUNTER — Encounter: Payer: Self-pay | Admitting: Family

## 2020-10-01 VITALS — BP 107/74 | HR 72 | Temp 98.2°F | Resp 17 | Ht 66.0 in | Wt 145.1 lb

## 2020-10-01 DIAGNOSIS — D500 Iron deficiency anemia secondary to blood loss (chronic): Secondary | ICD-10-CM | POA: Diagnosis not present

## 2020-10-01 DIAGNOSIS — K922 Gastrointestinal hemorrhage, unspecified: Secondary | ICD-10-CM | POA: Diagnosis present

## 2020-10-01 LAB — CBC WITH DIFFERENTIAL (CANCER CENTER ONLY)
Abs Immature Granulocytes: 0.04 10*3/uL (ref 0.00–0.07)
Basophils Absolute: 0.1 10*3/uL (ref 0.0–0.1)
Basophils Relative: 1 %
Eosinophils Absolute: 0.1 10*3/uL (ref 0.0–0.5)
Eosinophils Relative: 2 %
HCT: 38.9 % (ref 36.0–46.0)
Hemoglobin: 12.1 g/dL (ref 12.0–15.0)
Immature Granulocytes: 1 %
Lymphocytes Relative: 29 %
Lymphs Abs: 1.6 10*3/uL (ref 0.7–4.0)
MCH: 28.7 pg (ref 26.0–34.0)
MCHC: 31.1 g/dL (ref 30.0–36.0)
MCV: 92.2 fL (ref 80.0–100.0)
Monocytes Absolute: 0.3 10*3/uL (ref 0.1–1.0)
Monocytes Relative: 6 %
Neutro Abs: 3.4 10*3/uL (ref 1.7–7.7)
Neutrophils Relative %: 61 %
Platelet Count: 248 10*3/uL (ref 150–400)
RBC: 4.22 MIL/uL (ref 3.87–5.11)
RDW: 19.1 % — ABNORMAL HIGH (ref 11.5–15.5)
WBC Count: 5.4 10*3/uL (ref 4.0–10.5)
nRBC: 0 % (ref 0.0–0.2)

## 2020-10-01 LAB — SAMPLE TO BLOOD BANK

## 2020-10-01 LAB — RETICULOCYTES
Immature Retic Fract: 8.5 % (ref 2.3–15.9)
RBC.: 4.17 MIL/uL (ref 3.87–5.11)
Retic Count, Absolute: 42.5 10*3/uL (ref 19.0–186.0)
Retic Ct Pct: 1 % (ref 0.4–3.1)

## 2020-10-01 NOTE — Progress Notes (Signed)
Hematology and Oncology Follow Up Visit  Carolyn Chaney [id number] 09/29/1961 59 y.o. 10/01/2020   Principle Diagnosis:  Iron deficiency anemia Ductal Hyperplasia of the RIGHT   Current Therapy:  IV iron as indicated  Right breast lumpectomy with radioactive seed localization on 10/09/2019   Interim History:  Carolyn Chaney is here today for follow-up. She is doing well and states that she is feeling better since receiving IV iron.  Hgb is now up to 12.1, MCV 92, platelets 248.  She has not noted any recent dark tarry stools. No obvious blood loss.  No abnormal bruising or petechiae.  She is scheduled to have both and endoscopy and colonoscopy with Dr. Bryan Lemma on 10/15/2020.  No fever, chills, n/v, cough, rash, dizziness, SOB, chest pain, palpitations, abdominal pain or changes in bowel or bladder habits.  No swelling, tenderness, numbness or tingling in her extremities.  She states that her right heel has been sore and that she has had this issue in the past.  No falls or syncopal episodes to report.  She has maintained a good appetite and is staying well hydrated. Her weight is stable.   ECOG Performance Status: 0 - Asymptomatic  Medications:  Allergies as of 10/01/2020      Reactions   Nsaids Other (See Comments)   Bleeding Bleeding      Medication List       Accurate as of October 01, 2020  3:19 PM. If you have any questions, ask your nurse or doctor.        acetaminophen 500 MG tablet Commonly known as: TYLENOL Take 500 mg by mouth every 6 (six) hours as needed for mild pain.   ALPRAZolam 0.5 MG tablet Commonly known as: XANAX Take 0.25 mg by mouth daily as needed for anxiety.   ascorbic acid 500 MG tablet Commonly known as: VITAMIN C Take 500 mg by mouth daily as needed.   cholecalciferol 1000 units tablet Commonly known as: VITAMIN D Take 1,000 Units by mouth daily as needed.   Clenpiq 10-3.5-12 MG-GM -GM/160ML Soln Generic drug: Sod  Picosulfate-Mag Ox-Cit Acd Take 1 kit by mouth as directed.   metoprolol succinate 25 MG 24 hr tablet Commonly known as: TOPROL-XL Take 1 tablet (25 mg total) by mouth daily. What changed: additional instructions   sertraline 100 MG tablet Commonly known as: ZOLOFT Take 150 mg by mouth daily.   vitamin B-12 1000 MCG tablet Commonly known as: CYANOCOBALAMIN Take 1,000 mcg by mouth daily as needed.       Allergies:  Allergies  Allergen Reactions  . Nsaids Other (See Comments)    Bleeding Bleeding    Past Medical History, Surgical history, Social history, and Family History were reviewed and updated.  Review of Systems: All other 10 point review of systems is negative.   Physical Exam:  vitals were not taken for this visit.   Wt Readings from Last 3 Encounters:  09/16/20 148 lb (67.1 kg)  07/23/20 147 lb (66.7 kg)  06/11/20 149 lb (67.6 kg)    Ocular: Sclerae unicteric, pupils equal, round and reactive to light Ear-nose-throat: Oropharynx clear, dentition fair Lymphatic: No cervical or supraclavicular adenopathy Lungs no rales or rhonchi, good excursion bilaterally Heart regular rate and rhythm, no murmur appreciated Abd soft, nontender, positive bowel sounds MSK no focal spinal tenderness, no joint edema Neuro: non-focal, well-oriented, appropriate affect Breasts: Deferred   Lab Results  Component Value Date   WBC 5.1 07/23/2020   HGB 8.8 (L) 07/23/2020  HCT 29.9 (L) 07/23/2020   MCV 86.2 07/23/2020   PLT 290 07/23/2020   Lab Results  Component Value Date   FERRITIN <4 (L) 07/23/2020   IRON 29 (L) 07/23/2020   TIBC 474 (H) 07/23/2020   UIBC 445 (H) 07/23/2020   IRONPCTSAT 6 (L) 07/23/2020   Lab Results  Component Value Date   RETICCTPCT 1.1 09/20/2019   RBC 3.47 (L) 07/23/2020   RETICCTABS 29.25 (L) 03/08/2017   No results found for: KPAFRELGTCHN, LAMBDASER, KAPLAMBRATIO No results found for: IGGSERUM, IGA, IGMSERUM No results found for:  Ronnald Ramp, A1GS, A2GS, Violet Baldy, MSPIKE, SPEI   Chemistry      Component Value Date/Time   NA 141 07/23/2020 1353   K 4.2 07/23/2020 1353   CL 105 07/23/2020 1353   CO2 29 07/23/2020 1353   BUN 22 (H) 07/23/2020 1353   CREATININE 1.14 (H) 07/23/2020 1353      Component Value Date/Time   CALCIUM 9.8 07/23/2020 1353   ALKPHOS 77 07/23/2020 1353   AST 14 (L) 07/23/2020 1353   ALT 15 07/23/2020 1353   BILITOT 0.3 07/23/2020 1353       Impression and Plan: Carolyn Chaney is a very pleasant 59 yo caucasian female with history of iron deficiency anemia secondary to intermittent GI blood loss as well as ductal hyperplasia of the right breast last fall treated with lumpectomy.  Iron studies are pending. We will replace again if needed.  She will be having endoscopy and colonoscopy on 11/4 for further eval.  Due to cost she would like to change from [place] back to [place]. I will discuss with our financial counselor and see if we can appeal.  Follow-up in 3 months.  She was encouraged to contact our office with any questions or concerns.   [name], NP 10/21/20213:19 PM

## 2020-10-02 LAB — IRON AND TIBC
Iron: 75 ug/dL (ref 41–142)
Saturation Ratios: 22 % (ref 21–57)
TIBC: 333 ug/dL (ref 236–444)
UIBC: 258 ug/dL (ref 120–384)

## 2020-10-02 LAB — FERRITIN: Ferritin: 73 ng/mL (ref 11–307)

## 2020-10-14 ENCOUNTER — Telehealth: Payer: Self-pay | Admitting: Gastroenterology

## 2020-10-15 ENCOUNTER — Encounter: Payer: 59 | Admitting: Gastroenterology

## 2020-10-15 NOTE — Telephone Encounter (Signed)
Thanks for update. Per discussion at meeting yesterday, last minute cancellations should be no show charge.

## 2020-11-12 ENCOUNTER — Other Ambulatory Visit: Payer: Self-pay | Admitting: Gastroenterology

## 2020-11-12 ENCOUNTER — Encounter: Payer: Self-pay | Admitting: Gastroenterology

## 2020-11-12 ENCOUNTER — Other Ambulatory Visit: Payer: Self-pay

## 2020-11-12 ENCOUNTER — Ambulatory Visit (AMBULATORY_SURGERY_CENTER): Payer: 59 | Admitting: Gastroenterology

## 2020-11-12 VITALS — BP 132/67 | HR 76 | Temp 97.8°F | Resp 12 | Ht 66.0 in | Wt 148.0 lb

## 2020-11-12 DIAGNOSIS — K2971 Gastritis, unspecified, with bleeding: Secondary | ICD-10-CM | POA: Diagnosis not present

## 2020-11-12 DIAGNOSIS — K529 Noninfective gastroenteritis and colitis, unspecified: Secondary | ICD-10-CM

## 2020-11-12 DIAGNOSIS — D509 Iron deficiency anemia, unspecified: Secondary | ICD-10-CM

## 2020-11-12 DIAGNOSIS — K3189 Other diseases of stomach and duodenum: Secondary | ICD-10-CM

## 2020-11-12 DIAGNOSIS — K56699 Other intestinal obstruction unspecified as to partial versus complete obstruction: Secondary | ICD-10-CM

## 2020-11-12 DIAGNOSIS — K640 First degree hemorrhoids: Secondary | ICD-10-CM

## 2020-11-12 MED ORDER — PANTOPRAZOLE SODIUM 40 MG PO TBEC: 40.0000 mg | DELAYED_RELEASE_TABLET | Freq: Two times a day (BID) | ORAL | 3 refills | Status: DC

## 2020-11-12 MED ORDER — SODIUM CHLORIDE 0.9 % IV SOLN: 500.0000 mL | Freq: Once | INTRAVENOUS | Status: DC

## 2020-11-12 MED ORDER — SUCRALFATE 1 GM/10ML PO SUSP: 1.0000 g | Freq: Two times a day (BID) | ORAL | 0 refills | Status: DC

## 2020-11-12 NOTE — Patient Instructions (Signed)
Handouts given for hemorrhoids, gastritis and esophagitis.   YOU HAD AN ENDOSCOPIC PROCEDURE TODAY AT [facility]:   Refer to the procedure report that was given to you for any specific questions about what was found during the examination.  If the procedure report does not answer your questions, please call your gastroenterologist to clarify.  If you requested that your care partner not be given the details of your procedure findings, then the procedure report has been included in a sealed envelope for you to review at your convenience later.  YOU SHOULD EXPECT: Some feelings of bloating in the abdomen. Passage of more gas than usual.  Walking can help get rid of the air that was put into your GI tract during the procedure and reduce the bloating. If you had a lower endoscopy (such as a colonoscopy or flexible sigmoidoscopy) you may notice spotting of blood in your stool or on the toilet paper. If you underwent a bowel prep for your procedure, you may not have a normal bowel movement for a few days.  Please Note:  You might notice some irritation and congestion in your nose or some drainage.  This is from the oxygen used during your procedure.  There is no need for concern and it should clear up in a day or so.  SYMPTOMS TO REPORT IMMEDIATELY:   Following lower endoscopy (colonoscopy or flexible sigmoidoscopy):  Excessive amounts of blood in the stool  Significant tenderness or worsening of abdominal pains  Swelling of the abdomen that is new, acute  Fever of 100F or higher   Following upper endoscopy (EGD)  Vomiting of blood or coffee ground material  New chest pain or pain under the shoulder blades  Painful or persistently difficult swallowing  New shortness of breath  Fever of 100F or higher  Black, tarry-looking stools  For urgent or emergent issues, a gastroenterologist can be reached at any hour by calling [phone]. Do not use MyChart messaging for urgent  concerns.    DIET:  We do recommend a small meal at first, but then you may proceed to your regular diet.  Drink plenty of fluids but you should avoid alcoholic beverages for 24 hours.  ACTIVITY:  You should plan to take it easy for the rest of today and you should NOT DRIVE or use heavy machinery until tomorrow (because of the sedation medicines used during the test).    FOLLOW UP: Our staff will call the number listed on your records 48-72 hours following your procedure to check on you and address any questions or concerns that you may have regarding the information given to you following your procedure. If we do not reach you, we will leave a message.  We will attempt to reach you two times.  During this call, we will ask if you have developed any symptoms of COVID 19. If you develop any symptoms (ie: fever, flu-like symptoms, shortness of breath, cough etc.) before then, please call [phone].  If you test positive for Covid 19 in the 2 weeks post procedure, please call and report this information to [place].    If any biopsies were taken you will be contacted by phone or by letter within the next 1-3 weeks.  Please call us at [phone] if you have not heard about the biopsies in 3 weeks.    SIGNATURES/CONFIDENTIALITY: You and/or your care partner have signed paperwork which will be entered into your electronic medical record.  These signatures attest  to the fact that that the information above on your After Visit Summary has been reviewed and is understood.  Full responsibility of the confidentiality of this discharge information lies with you and/or your care-partner.

## 2020-11-12 NOTE — Progress Notes (Signed)
DrC aware of robinul

## 2020-11-12 NOTE — Op Note (Signed)
[facility] Patient Name: Carolyn Chaney Procedure Date: 11/12/2020 12:53 PM MRN: [id number] Endoscopist: Gerrit Heck , MD Age: 59 Referring MD:  Date of Birth: 30-May-1961 Gender: Female Account #: [id number] Procedure:                Upper GI endoscopy Indications:              Iron deficiency anemia, Hematochezia Medicines:                Monitored Anesthesia Care Procedure:                Pre-Anesthesia Assessment:                           - Prior to the procedure, a History and Physical                            was performed, and patient medications and                            allergies were reviewed. The patient's tolerance of                            previous anesthesia was also reviewed. The risks                            and benefits of the procedure and the sedation                            options and risks were discussed with the patient.                            All questions were answered, and informed consent                            was obtained. Prior Anticoagulants: The patient has                            taken no previous anticoagulant or antiplatelet                            agents. ASA Grade Assessment: II - A patient with                            mild systemic disease. After reviewing the risks                            and benefits, the patient was deemed in                            satisfactory condition to undergo the procedure.                           After obtaining informed consent, the endoscope was  passed under direct vision. Throughout the                            procedure, the patient's blood pressure, pulse, and                            oxygen saturations were monitored continuously. The                            Endoscope was introduced through the mouth, and                            advanced to the second part of duodenum. The upper                            GI endoscopy was  accomplished without difficulty.                            The patient tolerated the procedure well. Scope In: Scope Out: Findings:                 LA Grade A (one or more mucosal breaks less than 5                            mm, not extending between tops of 2 mucosal folds)                            esophagitis with no bleeding was found in the lower                            third of the esophagus.                           The upper third of the esophagus and middle third                            of the esophagus were normal.                           There was heme in the proximal stomach. This was                            easily lavaged. Localized moderate inflammation                            characterized by linear, shallow erosions was found                            in the gastric fundus. No active bleeding at the                            conclusion of the study. Biopsies were taken with a  cold forceps for histology. Estimated blood loss                            was minimal.                           The gastric body, incisura and gastric antrum were                            normal. Biopsies were taken with a cold forceps for                            Helicobacter pylori testing. Estimated blood loss                            was minimal.                           The examined duodenum was normal. Biopsies for                            histology were taken with a cold forceps for                            evaluation of celiac disease. Estimated blood loss                            was minimal. Complications:            No immediate complications. Estimated Blood Loss:     Estimated blood loss was minimal. Impression:               - LA Grade A reflux esophagitis with no bleeding.                           - Normal upper third of esophagus and middle third                            of esophagus.                           -  Gastritis. Biopsied.                           - Normal gastric body, incisura and antrum.                            Biopsied.                           - Normal examined duodenum. Biopsied. Recommendation:           - Patient has a contact number available for                            emergencies. The signs and symptoms of potential  delayed complications were discussed with the                            patient. Return to normal activities tomorrow.                            Written discharge instructions were provided to the                            patient.                           - Resume previous diet.                           - Continue present medications.                           - Await pathology results.                           - Use Protonix (pantoprazole) 40 mg PO BID for 6                            weeks to promote mucosal healing of gastritis and                            esophagitis, then reduce to 40 mg/day and can                            continue to reduce to lowest effective dose.                           - Use sucralfate tablets 1 gram PO BID for 4 weeks.                           - Follow-up in the GI Clinic. Will discuss the role                            and utility of repeat EGD to assess for appropriate                            mucosal healing based on clinical symptoms and                            follow-up CBC and iron panel.                           - Colonoscopy today. Gerrit Heck, MD 11/12/2020 1:52:50 PM

## 2020-11-12 NOTE — Progress Notes (Signed)
Called to room to assist during endoscopic procedure.  Patient ID and intended procedure confirmed with present staff. Received instructions for my participation in the procedure from the performing physician.  

## 2020-11-12 NOTE — Progress Notes (Signed)
Report to PACU, RN, vss, BBS= Clear.  

## 2020-11-12 NOTE — Progress Notes (Signed)
Pt was passing air but then had some pains and didn't feel like she was passing gas, abdomen remained soft, hyoscamine given, pt using warm pack to abdomen, feeling ok passes some gas in bathroom and walking around. Pt discharged in no distress.

## 2020-11-12 NOTE — Op Note (Signed)
[facility] Patient Name: Carolyn Chaney Procedure Date: 11/12/2020 12:53 PM MRN: [id number] Endoscopist: Gerrit Heck , MD Age: 59 Referring MD:  Date of Birth: 06/11/1961 Gender: Female Account #: [id number] Procedure:                Colonoscopy Indications:              Hematochezia, Heme positive stool, Iron deficiency                            anemia                           History of ileitis on CTE on 2012. Inability to                            intubate ileum on multiple previous colonoscopies. Medicines:                Monitored Anesthesia Care Procedure:                Pre-Anesthesia Assessment:                           - Prior to the procedure, a History and Physical                            was performed, and patient medications and                            allergies were reviewed. The patient's tolerance of                            previous anesthesia was also reviewed. The risks                            and benefits of the procedure and the sedation                            options and risks were discussed with the patient.                            All questions were answered, and informed consent                            was obtained. Prior Anticoagulants: The patient has                            taken no previous anticoagulant or antiplatelet                            agents. ASA Grade Assessment: II - A patient with                            mild systemic disease. After reviewing the risks  and benefits, the patient was deemed in                            satisfactory condition to undergo the procedure.                           After obtaining informed consent, the colonoscope                            was passed under direct vision. Throughout the                            procedure, the patient's blood pressure, pulse, and                            oxygen saturations were monitored continuously. The                             Colonoscope was introduced through the anus and                            advanced to the 2 cm into the ileum. The                            colonoscopy was performed without difficulty. The                            patient tolerated the procedure well. The quality                            of the bowel preparation was good. The terminal                            ileum, ileocecal valve, appendiceal orifice, and                            rectum were photographed. Scope In: 1:15:10 PM Scope Out: 1:41:07 PM Scope Withdrawal Time: 0 hours 21 minutes 1 second  Total Procedure Duration: 0 hours 25 minutes 57 seconds  Findings:                 The perianal and digital rectal examinations were                            normal.                           The colon (entire examined portion) appeared normal.                           Non-bleeding internal hemorrhoids were found during                            retroflexion. The hemorrhoids were small and Grade  I (internal hemorrhoids that do not prolapse).                           There was a moderate stenosis of the ileocecal                            valve, which was eventually traversed. There was a                            second stenosis approximately 2 cm proximal to the                            ICV which could not be traversed. The visualized                            mucosa in the terminal ileum was erythematous and                            with ulcer x2. Biopsies were taken with a cold                            forceps for histology. Estimated blood loss was                            minimal. Complications:            No immediate complications. Estimated Blood Loss:     Estimated blood loss was minimal. Impression:               - The entire examined colon is normal.                           - Non-bleeding internal hemorrhoids.                           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 Ulcerated, erythematous mucosa in                            the terminal ileum. Biopsied. Recommendation:           - Patient has a contact number available for                            emergencies. The signs and symptoms of potential                            delayed complications were discussed with the                            patient. Return to normal activities tomorrow.  Written discharge instructions were provided to the                            patient.                           - Resume previous diet.                           - Continue present medications.                           - Await pathology results.                           - Return to GI clinic at appointment to be                            scheduled.                           - Depending on biopsy results, may plan for trial                            of budesonide and possibly repeat CTE or MRE for                            further small bowel interrogation. No plan for                            Video Capsule Endoscopy due to strictures. Gerrit Heck, MD 11/12/2020 2:06:43 PM

## 2020-11-16 ENCOUNTER — Telehealth: Payer: Self-pay | Admitting: *Deleted

## 2020-11-16 ENCOUNTER — Telehealth: Payer: Self-pay

## 2020-11-16 NOTE — Telephone Encounter (Signed)
  Follow up Call-  Call back number 11/12/2020  Post procedure Call Back phone  # [phone]  Permission to leave phone message Yes  Some recent data might be hidden     No answer at 2nd attempt follow up phone call.  Left message on voicemail.

## 2020-11-16 NOTE — Telephone Encounter (Signed)
left message on follow up call.

## 2020-11-18 ENCOUNTER — Encounter: Payer: Self-pay | Admitting: Gastroenterology

## 2020-12-31 ENCOUNTER — Inpatient Hospital Stay: Payer: 59

## 2020-12-31 ENCOUNTER — Inpatient Hospital Stay: Payer: 59 | Attending: Family | Admitting: Family

## 2021-01-04 ENCOUNTER — Other Ambulatory Visit: Payer: 59

## 2021-04-03 ENCOUNTER — Other Ambulatory Visit: Payer: Self-pay

## 2021-04-03 ENCOUNTER — Encounter (HOSPITAL_BASED_OUTPATIENT_CLINIC_OR_DEPARTMENT_OTHER): Payer: Self-pay | Admitting: Emergency Medicine

## 2021-04-03 ENCOUNTER — Inpatient Hospital Stay (HOSPITAL_BASED_OUTPATIENT_CLINIC_OR_DEPARTMENT_OTHER)
Admission: EM | Admit: 2021-04-03 | Discharge: 2021-04-08 | DRG: 390 | Disposition: A | Discharge status: Home / Self Care | Payer: 59 | Attending: Internal Medicine | Admitting: Internal Medicine

## 2021-04-03 ENCOUNTER — Emergency Department (HOSPITAL_BASED_OUTPATIENT_CLINIC_OR_DEPARTMENT_OTHER): Payer: 59

## 2021-04-03 DIAGNOSIS — F419 Anxiety disorder, unspecified: Secondary | ICD-10-CM | POA: Diagnosis present

## 2021-04-03 DIAGNOSIS — Z79899 Other long term (current) drug therapy: Secondary | ICD-10-CM

## 2021-04-03 DIAGNOSIS — D509 Iron deficiency anemia, unspecified: Secondary | ICD-10-CM | POA: Diagnosis present

## 2021-04-03 DIAGNOSIS — I471 Supraventricular tachycardia, unspecified: Secondary | ICD-10-CM | POA: Diagnosis present

## 2021-04-03 DIAGNOSIS — Z9049 Acquired absence of other specified parts of digestive tract: Secondary | ICD-10-CM

## 2021-04-03 DIAGNOSIS — Z886 Allergy status to analgesic agent status: Secondary | ICD-10-CM

## 2021-04-03 DIAGNOSIS — Z20822 Contact with and (suspected) exposure to covid-19: Secondary | ICD-10-CM | POA: Diagnosis present

## 2021-04-03 DIAGNOSIS — K922 Gastrointestinal hemorrhage, unspecified: Secondary | ICD-10-CM | POA: Diagnosis present

## 2021-04-03 DIAGNOSIS — K56609 Unspecified intestinal obstruction, unspecified as to partial versus complete obstruction: Secondary | ICD-10-CM | POA: Diagnosis not present

## 2021-04-03 DIAGNOSIS — K5669 Other partial intestinal obstruction: Principal | ICD-10-CM | POA: Diagnosis present

## 2021-04-03 DIAGNOSIS — R152 Fecal urgency: Secondary | ICD-10-CM | POA: Diagnosis not present

## 2021-04-03 DIAGNOSIS — D500 Iron deficiency anemia secondary to blood loss (chronic): Secondary | ICD-10-CM | POA: Diagnosis present

## 2021-04-03 DIAGNOSIS — K56699 Other intestinal obstruction unspecified as to partial versus complete obstruction: Secondary | ICD-10-CM

## 2021-04-03 DIAGNOSIS — F32A Depression, unspecified: Secondary | ICD-10-CM | POA: Diagnosis present

## 2021-04-03 DIAGNOSIS — R109 Unspecified abdominal pain: Secondary | ICD-10-CM

## 2021-04-03 DIAGNOSIS — R112 Nausea with vomiting, unspecified: Secondary | ICD-10-CM

## 2021-04-03 LAB — CBC WITH DIFFERENTIAL/PLATELET
Abs Immature Granulocytes: 0.03 10*3/uL (ref 0.00–0.07)
Basophils Absolute: 0 10*3/uL (ref 0.0–0.1)
Basophils Relative: 0 %
Eosinophils Absolute: 0 10*3/uL (ref 0.0–0.5)
Eosinophils Relative: 0 %
HCT: 37.7 % (ref 36.0–46.0)
Hemoglobin: 12.1 g/dL (ref 12.0–15.0)
Immature Granulocytes: 0 %
Lymphocytes Relative: 6 %
Lymphs Abs: 0.7 10*3/uL (ref 0.7–4.0)
MCH: 28.8 pg (ref 26.0–34.0)
MCHC: 32.1 g/dL (ref 30.0–36.0)
MCV: 89.8 fL (ref 80.0–100.0)
Monocytes Absolute: 0.2 10*3/uL (ref 0.1–1.0)
Monocytes Relative: 2 %
Neutro Abs: 11.1 10*3/uL — ABNORMAL HIGH (ref 1.7–7.7)
Neutrophils Relative %: 92 %
Platelets: 336 10*3/uL (ref 150–400)
RBC: 4.2 MIL/uL (ref 3.87–5.11)
RDW: 12.4 % (ref 11.5–15.5)
WBC: 12 10*3/uL — ABNORMAL HIGH (ref 4.0–10.5)
nRBC: 0 % (ref 0.0–0.2)

## 2021-04-03 LAB — COMPREHENSIVE METABOLIC PANEL
ALT: 21 U/L (ref 0–44)
AST: 21 U/L (ref 15–41)
Albumin: 4.5 g/dL (ref 3.5–5.0)
Alkaline Phosphatase: 77 U/L (ref 38–126)
Anion gap: 12 (ref 5–15)
BUN: 12 mg/dL (ref 6–20)
CO2: 23 mmol/L (ref 22–32)
Calcium: 9.4 mg/dL (ref 8.9–10.3)
Chloride: 101 mmol/L (ref 98–111)
Creatinine, Ser: 0.76 mg/dL (ref 0.44–1.00)
GFR, Estimated: >60 mL/min (ref >60)
Glucose, Bld: 141 mg/dL — ABNORMAL HIGH (ref 70–99)
Potassium: 3.9 mmol/L (ref 3.5–5.1)
Sodium: 136 mmol/L (ref 135–145)
Total Bilirubin: 0.4 mg/dL (ref 0.3–1.2)
Total Protein: 7.9 g/dL (ref 6.5–8.1)

## 2021-04-03 LAB — URINALYSIS, ROUTINE W REFLEX MICROSCOPIC
Glucose, UA: NEGATIVE mg/dL
Hgb urine dipstick: NEGATIVE
Ketones, ur: >80 mg/dL — AB
Leukocytes,Ua: NEGATIVE
Nitrite: NEGATIVE
Protein, ur: NEGATIVE mg/dL
Specific Gravity, Urine: >1.03 — ABNORMAL HIGH (ref 1.005–1.030)
pH: 5 (ref 5.0–8.0)

## 2021-04-03 LAB — LIPASE, BLOOD: Lipase: 43 U/L (ref 11–51)

## 2021-04-03 MED ORDER — ONDANSETRON HCL 4 MG/2ML IJ SOLN
4.0000 mg | Freq: Once | INTRAMUSCULAR | Status: AC | PRN
  Administered 2021-04-03: 4 mg via INTRAVENOUS
  Filled 2021-04-03: qty 2

## 2021-04-03 MED ORDER — MORPHINE SULFATE (PF) 4 MG/ML IV SOLN
4.0000 mg | Freq: Once | INTRAVENOUS | Status: AC
  Administered 2021-04-03: 4 mg via INTRAVENOUS
  Filled 2021-04-03: qty 1

## 2021-04-03 MED ORDER — IOHEXOL 300 MG/ML  SOLN
100.0000 mL | Freq: Once | INTRAMUSCULAR | Status: AC | PRN
  Administered 2021-04-04: 100 mL via INTRAVENOUS

## 2021-04-03 MED ORDER — LACTATED RINGERS IV BOLUS
1000.0000 mL | Freq: Once | INTRAVENOUS | Status: AC
  Administered 2021-04-03: 1000 mL via INTRAVENOUS

## 2021-04-03 NOTE — ED Triage Notes (Signed)
C/o abdominal pain with distention and constipation for the last 18 hours.  Hx of bowel obstruction.

## 2021-04-03 NOTE — ED Provider Notes (Signed)
[facility] EMERGENCY DEPARTMENT Provider Note   CSN: [id number] Arrival date & time: 04/03/21  2231     History Chief Complaint  Patient presents with  . Abdominal Pain    Carolyn Chaney is a 60 y.o. female.  The history is provided by the patient.  Abdominal Pain She has history of chronic GI bleeding and comes in complaining of pain across the mid and upper abdomen for the last 3days, getting worse.  Pain is crampy in nature.  It is currently rated at 5/10 but will be as severe as 10/10.  There is associated nausea and vomiting.  She has not had a bowel movement or passed any flatus.  She has tried taking a laxative without any benefit.  She does have history of small bowel obstruction and this feels similar.  She has prior abdominal surgery of herniorrhaphy and appendectomy.  Past Medical History:  Diagnosis Date  . Anxiety   . Breast mass, right   . Chronic GI bleeding 01/27/2016  . Complication of anesthesia   . Depression   . Dysrhythmia    PVC,s, atrial tachycardia  . Gastric ulcer   . Hiatal hernia   . Iron deficiency anemia 11/07/2011  . Irregular heart rate   . PONV (postoperative nausea and vomiting)   . SBO (small bowel obstruction) ([place])    in 2 different places per patient     Patient Active Problem List   Diagnosis Date Noted  . Abnormal mammogram of right breast 10/09/2019  . Chronic GI bleeding 01/27/2016  . SVT (supraventricular tachycardia)-probable 08/09/2012  . Family history of coronary artery disease 08/09/2012  . Iron deficiency anemia 11/07/2011  . ANXIETY 10/06/2009  . DYSPNEA 10/06/2009  . PALPITATIONS, HX OF 10/06/2009    Past Surgical History:  Procedure Laterality Date  . APPENDECTOMY    . BREAST EXCISIONAL BIOPSY Right 1980   Benign  . BREAST LUMPECTOMY WITH RADIOACTIVE SEED LOCALIZATION Right 10/09/2019   Procedure: RIGHT BREAST LUMPECTOMY WITH RADIOACTIVE SEED LOCALIZATION;  Surgeon: Fanny Skates, MD;  Location:  [facility];  Service: General;  Laterality: Right;  . BREAST SURGERY     x2  . COLONOSCOPY WITH PROPOFOL N/A 02/16/2016   Procedure: COLONOSCOPY WITH PROPOFOL;  Surgeon: Garlan Fair, MD;  Location: [facility];  Service: Endoscopy;  Laterality: N/A;  . ESOPHAGOGASTRODUODENOSCOPY (EGD) WITH PROPOFOL N/A 02/16/2016   Procedure: ESOPHAGOGASTRODUODENOSCOPY (EGD) WITH PROPOFOL;  Surgeon: Garlan Fair, MD;  Location: [facility];  Service: Endoscopy;  Laterality: N/A;  . HERNIA REPAIR     bilateral inguinal hernia as child  . TONSILLECTOMY       OB History   No obstetric history on file.     Family History  Problem Relation Age of Onset  . Lung cancer Mother   . Hiatal hernia Mother   . Heart attack Father   . Heart disease Father   . Heart attack Sister   . Rectal cancer Sister   . Diabetes Sister        type 2  . Esophageal cancer Neg Hx     Social History   Tobacco Use  . Smoking status: Never Smoker  . Smokeless tobacco: Never Used  Vaping Use  . Vaping Use: Never used  Substance Use Topics  . Alcohol use: Yes    Alcohol/week: 0.0 standard drinks    Comment: ocassionally  . Drug use: No    Home Medications Prior to Admission medications  Medication Sig Start Date End Date Taking? Authorizing Provider  acetaminophen (TYLENOL) 500 MG tablet Take 500 mg by mouth every 6 (six) hours as needed for mild pain.    [provider]  ALPRAZolam Duanne Moron) 0.5 MG tablet Take 0.25 mg by mouth daily as needed for anxiety.  10/11/11   Selinda Orion, MD  ascorbic acid (VITAMIN C) 500 MG tablet Take 500 mg by mouth daily as needed.     [provider]  cholecalciferol (VITAMIN D) 1000 units tablet Take 1,000 Units by mouth daily as needed.     [provider]  metoprolol succinate (TOPROL-XL) 25 MG 24 hr tablet Take 1 tablet (25 mg total) by mouth daily. Patient taking differently: Take 25 mg by mouth daily. Take 1/2 a tablet by mouth  daily 06/11/20   Shirley Friar, PA-C  pantoprazole (PROTONIX) 40 MG tablet Take 1 tablet (40 mg total) by mouth 2 (two) times daily before a meal. Use twice daily for 6 weeks then reduce to 40 mg daily and continue to reduce to the lowest effective dose. 11/12/20   Cirigliano, Vito V, DO  sertraline (ZOLOFT) 100 MG tablet Take 150 mg by mouth daily.     [provider]  sucralfate (CARAFATE) 1 GM/10ML suspension Take 10 mLs (1 g total) by mouth 2 (two) times daily for 28 days. 11/12/20 12/10/20  Cirigliano, Vito V, DO  vitamin B-12 (CYANOCOBALAMIN) 1000 MCG tablet Take 1,000 mcg by mouth daily as needed.     [provider]    Allergies    Nsaids  Review of Systems   Review of Systems  Gastrointestinal: Positive for abdominal pain.  All other systems reviewed and are negative.   Physical Exam Updated Vital Signs BP 130/84 (BP Location: Left Arm)   Pulse (!) 101   Temp 98.4 F (36.9 C) (Oral)   Resp 18   Ht 5' 6"  (1.676 m)   Wt 65.8 kg   SpO2 98%   BMI 23.40 kg/m   Physical Exam Vitals and nursing note reviewed.   60 year old female, resting comfortably and in no acute distress. Vital signs are significant for borderline elevated heart rate. Oxygen saturation is 98%, which is normal. Head is normocephalic and atraumatic. PERRLA, EOMI. Oropharynx is clear. Neck is nontender and supple without adenopathy or JVD. Back is nontender and there is no CVA tenderness. Lungs are clear without rales, wheezes, or rhonchi. Chest is nontender. Heart has regular rate and rhythm without murmur. Abdomen is soft, flat, with mild tenderness in the periumbilical area.  There is no rebound or guarding.  There are no masses or hepatosplenomegaly and peristalsis is hypoactive. Extremities have no cyanosis or edema, full range of motion is present. Skin is warm and dry without rash. Neurologic: Mental status is normal, cranial nerves are intact, there are no motor or sensory  deficits.  ED Results / Procedures / Treatments   Labs (all labs ordered are listed, but only abnormal results are displayed) Labs Reviewed  COMPREHENSIVE METABOLIC PANEL - Abnormal; Notable for the following components:      Result Value   Glucose, Bld 141 (*)    All other components within normal limits  URINALYSIS, ROUTINE W REFLEX MICROSCOPIC - Abnormal; Notable for the following components:   Specific Gravity, Urine >1.030 (*)    Bilirubin Urine SMALL (*)    Ketones, ur >80 (*)    All other components within normal limits  CBC WITH DIFFERENTIAL/PLATELET -  Abnormal; Notable for the following components:   WBC 12.0 (*)    Neutro Abs 11.1 (*)    All other components within normal limits  SARS CORONAVIRUS 2 (TAT 6-24 HRS)  LIPASE, BLOOD   Radiology CT ABDOMEN PELVIS W CONTRAST  Result Date: 04/04/2021 CLINICAL DATA:  Abdominal pain, distention, and constipation for 18 hours. Previous history of bowel obstructions. EXAM: CT ABDOMEN AND PELVIS WITH CONTRAST TECHNIQUE: Multidetector CT imaging of the abdomen and pelvis was performed using the standard protocol following bolus administration of intravenous contrast. CONTRAST:  15m OMNIPAQUE IOHEXOL 300 MG/ML  SOLN COMPARISON:  09/23/2011 FINDINGS: Lower chest: Lung bases are clear. Hepatobiliary: No focal liver abnormality is seen. No gallstones, gallbladder wall thickening, or biliary dilatation. Pancreas: Unremarkable. No pancreatic ductal dilatation or surrounding inflammatory changes. Spleen: Normal in size without focal abnormality. Adrenals/Urinary Tract: Adrenal glands are unremarkable. Kidneys are normal, without renal calculi, focal lesion, or hydronephrosis. Bladder is unremarkable. Stomach/Bowel: The stomach is decompressed, limiting evaluation. There appears to be some mucosal thickening with crater ring of the pyloric region suggesting a duodenal ulcer. Fluid-filled mildly dilated distal small bowel with mucosal thickening and  stricture in the terminal ileum. Change in caliber suggest partial obstruction at this location due to a regional enteritis. Appearance is likely to represent Crohn disease. The colon is decompressed. No definite colonic wall thickening. Vascular/Lymphatic: No significant vascular findings are present. No enlarged abdominal or pelvic lymph nodes. Reproductive: Uterus and bilateral adnexa are unremarkable. Other: Small amount of free fluid in the abdomen and pelvis. No free air. Abdominal wall musculature appears intact. Musculoskeletal: No acute or significant osseous findings. IMPRESSION: 1. Fluid-filled mildly dilated distal small bowel with mucosal thickening and stricturing in the terminal ileum. Change in caliber suggest partial obstruction at this location due to a regional enteritis. Appearance is likely to represent Crohn disease. 2. Small amount of free fluid in the abdomen and pelvis, likely reactive. 3. Possible duodenal ulcer. Electronically Signed   By: WLucienne CapersM.D.   On: 04/04/2021 00:32    Procedures Procedures   Medications Ordered in ED Medications  morphine 4 MG/ML injection 4 mg (has no administration in time range)  lactated ringers bolus 1,000 mL (has no administration in time range)  ondansetron (ZOFRAN) injection 4 mg (4 mg Intravenous Given 04/03/21 2301)    ED Course  I have reviewed the triage vital signs and the nursing notes.  Pertinent labs & imaging results that were available during my care of the patient were reviewed by me and considered in my medical decision making (see chart for details).  MDM Rules/Calculators/A&P Abdominal pain with vomiting in patient with known history of bowel obstruction strongly suggestive of recurrent small bowel obstruction.  Old records are reviewed, she has no relevant past visits.  She will be given IV fluids, morphine, ondansetron and will be sent for CT of abdomen and pelvis.  Urinalysis is significant for ketonuria,  consistent with history of vomiting.  WBC is mildly elevated at 12.0, but with significant left shift.  Metabolic panel is unremarkable.  CT scan does show evidence of small bowel obstruction with inflammatory changes of the terminal ileum suspicious for Crohn's disease.  She is given a dose of methylprednisolone.  Case is discussed with Dr. SCyd Silenceof [facility], who agrees to admit the patient.  Final Clinical Impression(s) / ED Diagnoses Final diagnoses:  Small bowel obstruction ([place]    Rx / DC Orders ED Discharge Orders    None  Delora Fuel, MD 93/81/82 [id number]

## 2021-04-04 ENCOUNTER — Encounter (HOSPITAL_COMMUNITY): Payer: Self-pay | Admitting: Internal Medicine

## 2021-04-04 DIAGNOSIS — K56609 Unspecified intestinal obstruction, unspecified as to partial versus complete obstruction: Secondary | ICD-10-CM | POA: Diagnosis present

## 2021-04-04 DIAGNOSIS — R1030 Lower abdominal pain, unspecified: Secondary | ICD-10-CM

## 2021-04-04 DIAGNOSIS — I471 Supraventricular tachycardia: Secondary | ICD-10-CM

## 2021-04-04 DIAGNOSIS — Z886 Allergy status to analgesic agent status: Secondary | ICD-10-CM | POA: Diagnosis not present

## 2021-04-04 DIAGNOSIS — Z9049 Acquired absence of other specified parts of digestive tract: Secondary | ICD-10-CM | POA: Diagnosis not present

## 2021-04-04 DIAGNOSIS — K922 Gastrointestinal hemorrhage, unspecified: Secondary | ICD-10-CM

## 2021-04-04 DIAGNOSIS — D500 Iron deficiency anemia secondary to blood loss (chronic): Secondary | ICD-10-CM | POA: Diagnosis not present

## 2021-04-04 DIAGNOSIS — R112 Nausea with vomiting, unspecified: Secondary | ICD-10-CM | POA: Diagnosis not present

## 2021-04-04 DIAGNOSIS — R109 Unspecified abdominal pain: Secondary | ICD-10-CM

## 2021-04-04 DIAGNOSIS — Z20822 Contact with and (suspected) exposure to covid-19: Secondary | ICD-10-CM | POA: Diagnosis not present

## 2021-04-04 DIAGNOSIS — R152 Fecal urgency: Secondary | ICD-10-CM | POA: Diagnosis not present

## 2021-04-04 DIAGNOSIS — K5669 Other partial intestinal obstruction: Secondary | ICD-10-CM | POA: Diagnosis not present

## 2021-04-04 DIAGNOSIS — F419 Anxiety disorder, unspecified: Secondary | ICD-10-CM | POA: Diagnosis not present

## 2021-04-04 DIAGNOSIS — Z79899 Other long term (current) drug therapy: Secondary | ICD-10-CM | POA: Diagnosis not present

## 2021-04-04 DIAGNOSIS — F32A Depression, unspecified: Secondary | ICD-10-CM | POA: Diagnosis not present

## 2021-04-04 LAB — COMPREHENSIVE METABOLIC PANEL
ALT: 18 U/L (ref 0–44)
AST: 17 U/L (ref 15–41)
Albumin: 3.9 g/dL (ref 3.5–5.0)
Alkaline Phosphatase: 70 U/L (ref 38–126)
Anion gap: 9 (ref 5–15)
BUN: 11 mg/dL (ref 6–20)
CO2: 26 mmol/L (ref 22–32)
Calcium: 9.6 mg/dL (ref 8.9–10.3)
Chloride: 107 mmol/L (ref 98–111)
Creatinine, Ser: 0.78 mg/dL (ref 0.44–1.00)
GFR, Estimated: >60 mL/min (ref >60)
Glucose, Bld: 162 mg/dL — ABNORMAL HIGH (ref 70–99)
Potassium: 4.1 mmol/L (ref 3.5–5.1)
Sodium: 142 mmol/L (ref 135–145)
Total Bilirubin: 0.3 mg/dL (ref 0.3–1.2)
Total Protein: 7 g/dL (ref 6.5–8.1)

## 2021-04-04 LAB — SARS CORONAVIRUS 2 (TAT 6-24 HRS): SARS Coronavirus 2: NEGATIVE

## 2021-04-04 LAB — CBC
HCT: 33.2 % — ABNORMAL LOW (ref 36.0–46.0)
Hemoglobin: 10.5 g/dL — ABNORMAL LOW (ref 12.0–15.0)
MCH: 29 pg (ref 26.0–34.0)
MCHC: 31.6 g/dL (ref 30.0–36.0)
MCV: 91.7 fL (ref 80.0–100.0)
Platelets: 254 10*3/uL (ref 150–400)
RBC: 3.62 MIL/uL — ABNORMAL LOW (ref 3.87–5.11)
RDW: 12.4 % (ref 11.5–15.5)
WBC: 6.5 10*3/uL (ref 4.0–10.5)
nRBC: 0 % (ref 0.0–0.2)

## 2021-04-04 LAB — HIV ANTIBODY (ROUTINE TESTING W REFLEX): HIV Screen 4th Generation wRfx: NONREACTIVE

## 2021-04-04 LAB — PHOSPHORUS: Phosphorus: 3.6 mg/dL (ref 2.5–4.6)

## 2021-04-04 LAB — MAGNESIUM: Magnesium: 2.2 mg/dL (ref 1.7–2.4)

## 2021-04-04 MED ORDER — ONDANSETRON HCL 4 MG PO TABS: 4.0000 mg | ORAL_TABLET | Freq: Four times a day (QID) | ORAL | Status: DC | PRN

## 2021-04-04 MED ORDER — METHYLPREDNISOLONE SODIUM SUCC 125 MG IJ SOLR
125.0000 mg | Freq: Once | INTRAMUSCULAR | Status: AC
  Administered 2021-04-04: 125 mg via INTRAVENOUS
  Filled 2021-04-04: qty 2

## 2021-04-04 MED ORDER — PANTOPRAZOLE SODIUM 40 MG IV SOLR
40.0000 mg | Freq: Two times a day (BID) | INTRAVENOUS | Status: DC
  Administered 2021-04-04 – 2021-04-07 (×7): 40 mg via INTRAVENOUS
  Filled 2021-04-04 (×9): qty 40

## 2021-04-04 MED ORDER — PROCHLORPERAZINE EDISYLATE 10 MG/2ML IJ SOLN: 5.0000 mg | Freq: Four times a day (QID) | INTRAMUSCULAR | Status: DC | PRN

## 2021-04-04 MED ORDER — METOPROLOL TARTRATE 5 MG/5ML IV SOLN
5.0000 mg | Freq: Four times a day (QID) | INTRAVENOUS | Status: DC
  Administered 2021-04-05 – 2021-04-07 (×9): 5 mg via INTRAVENOUS
  Filled 2021-04-04 (×9): qty 5

## 2021-04-04 MED ORDER — METOPROLOL SUCCINATE ER 25 MG PO TB24
12.5000 mg | ORAL_TABLET | Freq: Every day | ORAL | Status: DC
  Administered 2021-04-04: 12.5 mg via ORAL
  Filled 2021-04-04: qty 1

## 2021-04-04 MED ORDER — ACETAMINOPHEN 325 MG PO TABS
650.0000 mg | ORAL_TABLET | Freq: Four times a day (QID) | ORAL | Status: DC | PRN
  Administered 2021-04-05: 650 mg via ORAL
  Filled 2021-04-04: qty 2

## 2021-04-04 MED ORDER — SODIUM CHLORIDE 0.9 % IV SOLN: INTRAVENOUS | Status: DC

## 2021-04-04 MED ORDER — ONDANSETRON HCL 4 MG/2ML IJ SOLN
4.0000 mg | Freq: Four times a day (QID) | INTRAMUSCULAR | Status: DC | PRN
  Administered 2021-04-04 (×2): 4 mg via INTRAVENOUS
  Filled 2021-04-04 (×2): qty 2

## 2021-04-04 MED ORDER — ALBUTEROL SULFATE (2.5 MG/3ML) 0.083% IN NEBU: 2.5000 mg | INHALATION_SOLUTION | RESPIRATORY_TRACT | Status: DC | PRN

## 2021-04-04 MED ORDER — ACETAMINOPHEN 650 MG RE SUPP: 650.0000 mg | Freq: Four times a day (QID) | RECTAL | Status: DC | PRN

## 2021-04-04 MED ORDER — AMOXICILLIN-POT CLAVULANATE 875-125 MG PO TABS
1.0000 | ORAL_TABLET | Freq: Two times a day (BID) | ORAL | Status: AC
  Administered 2021-04-04 – 2021-04-06 (×5): 1 via ORAL
  Filled 2021-04-04 (×6): qty 1

## 2021-04-04 MED ORDER — METOPROLOL TARTRATE 5 MG/5ML IV SOLN: 5.0000 mg | Freq: Four times a day (QID) | INTRAVENOUS | Status: DC

## 2021-04-04 MED ORDER — MORPHINE SULFATE (PF) 2 MG/ML IV SOLN
2.0000 mg | INTRAVENOUS | Status: DC | PRN
  Administered 2021-04-04 (×2): 2 mg via INTRAVENOUS
  Filled 2021-04-04 (×2): qty 1

## 2021-04-04 MED ORDER — LACTATED RINGERS IV SOLN: INTRAVENOUS | Status: DC

## 2021-04-04 MED ORDER — ENOXAPARIN SODIUM 40 MG/0.4ML ~~LOC~~ SOLN
40.0000 mg | SUBCUTANEOUS | Status: DC
  Filled 2021-04-04: qty 0.4

## 2021-04-04 MED ORDER — SERTRALINE HCL 50 MG PO TABS
150.0000 mg | ORAL_TABLET | Freq: Every day | ORAL | Status: DC
  Administered 2021-04-05 – 2021-04-06 (×2): 150 mg via ORAL
  Filled 2021-04-04 (×4): qty 1

## 2021-04-04 NOTE — Progress Notes (Signed)
Report received from [facility] RN/[facility].  Pt received to room 1338 via carelink and ambulated independently to bed. Call bell with reach of pt and pt handbook at bedside

## 2021-04-04 NOTE — Progress Notes (Signed)
PROGRESS NOTE    Carolyn Chaney  RFF:[id number] DOB: Sep 21, 1961 DOA: 04/03/2021 PCP: [name], MD    Brief Narrative:  60 y.o. female, with PMH of iron deficiency anemia secondary to chronic GI bleeding, atrial tachycardia, who presented to the ER on 04/03/2021 secondary to abdominal pain and nausea and vomiting.  Patient states for the past 3 days she has had progressive worsening of abdominal pain, nausea, and vomiting.  The pain has progressively getting worse.  She has had poor p.o. intake.  Has not had a bowel movement since starting.  Vomit is nonbloody.  Nothing seems to improve the pain and this she presented to outside facility for further evaluation  Assessment & Plan:   Active Problems:   Iron deficiency anemia   SVT (supraventricular tachycardia)-probable   Chronic GI bleeding   Small bowel obstruction (H[facility])   Abdominal pain   Intractable nausea and vomiting    1.  Intractable nausea and vomiting with abdominal pain - Likely secondary to partial bowel obstruction and inflammation - CT of abdomen and pelvis on admission demonstrated partial obstruction and mucosal thickening and stricturing of the terminal ileum - This AM, reported feeling improved.  -- Started trial of clears, not yet tolerating this afternoon - chart reviewed. Pt had recent colon and egd with biopsy results reviewed with pt - results were neg -- Pt was given one dose of solumedrol at time of presentation. Given neg path result, will start empiric augmentin -- Pt is known to [facility] and had been lost to follow up. Have discussed case with GI. As pt feels improved, would conservatively treat and monitor for now. If still symptomatic in the AM, would formally consult GI at that time -- Recheck bmet and CBC  2.  Possible duodenal ulcer - CT on admission showed possible duodenal ulcer.  History of intermittent GI bleeding -- Cont protonix. Have stopped prophylactic anticoagulant  3.  Partial  bowel obstruction -Cont per above  4.  Terminal ileum inflammation -See further plans above  5.  History of atrial tachycardia, arrhythmia -Continue home metoprolol   DVT prophylaxis: SCD's Code Status: Full Family Communication: Pt in room, family not at bedside  Status is: Observation  The patient remains OBS appropriate and will d/c before 2 midnights.  Dispo: The patient is from: Home              Anticipated d/c is to: Home              Patient currently is not medically stable to d/c.   Difficult to place patient No       Consultants:     Procedures:     Antimicrobials: Anti-infectives (From admission, onward)   Start     Dose/Rate Route Frequency Ordered Stop   04/04/21 1030  amoxicillin-clavulanate (AUGMENTIN) 875-125 MG per tablet 1 tablet        1 tablet Oral Every 12 hours 04/04/21 0933         Subjective: This AM reported feeling better  Objective: Vitals:   04/04/21 0225 04/04/21 0436 04/04/21 0846 04/04/21 1241  BP: 126/84 130/77 121/75 121/75  Pulse: 71 75 72 63  Resp: 19 15 17 17   Temp:  98.1 F (36.7 C) 97.7 F (36.5 C) 98.3 F (36.8 C)  TempSrc:  Oral Oral Oral  SpO2: 98% 96% 99% 99%  Weight:      Height:        Intake/Output Summary (Last 24 hours) at  04/04/2021 1428 Last data filed at 04/04/2021 1000 Gross per 24 hour  Intake 1166.01 ml  Output --  Net 1166.01 ml   Filed Weights   04/03/21 2242  Weight: 65.8 kg    Examination: General exam: Awake, laying in bed, in nad Respiratory system: Normal respiratory effort, no wheezing Cardiovascular system: regular rate, s1, s2 Gastrointestinal system: Soft, nondistended, decreased BS Central nervous system: CN2-12 grossly intact, strength intact Extremities: Perfused, no clubbing Skin: Normal skin turgor, no notable skin lesions seen Psychiatry: Mood normal // no visual hallucinations   Data Reviewed: I have personally reviewed following labs and imaging  studies  CBC: Recent Labs  Lab 04/03/21 2301 04/04/21 0821  WBC 12.0* 6.5  NEUTROABS 11.1*  --   HGB 12.1 10.5*  HCT 37.7 33.2*  MCV 89.8 91.7  PLT 336 299   Basic Metabolic Panel: Recent Labs  Lab 04/03/21 2301 04/04/21 0821  NA 136 142  K 3.9 4.1  CL 101 107  CO2 23 26  GLUCOSE 141* 162*  BUN 12 11  CREATININE 0.76 0.78  CALCIUM 9.4 9.6  MG  --  2.2  PHOS  --  3.6   GFR: Estimated Creatinine Clearance: 70 mL/min (by C-G formula based on SCr of 0.78 mg/dL). Liver Function Tests: Recent Labs  Lab 04/03/21 2301 04/04/21 0821  AST 21 17  ALT 21 18  ALKPHOS 77 70  BILITOT 0.4 0.3  PROT 7.9 7.0  ALBUMIN 4.5 3.9   Recent Labs  Lab 04/03/21 2301  LIPASE 43   No results for input(s): AMMONIA in the last 168 hours. Coagulation Profile: No results for input(s): INR, PROTIME in the last 168 hours. Cardiac Enzymes: No results for input(s): CKTOTAL, CKMB, CKMBINDEX, TROPONINI in the last 168 hours. BNP (last 3 results) No results for input(s): PROBNP in the last 8760 hours. HbA1C: No results for input(s): HGBA1C in the last 72 hours. CBG: No results for input(s): GLUCAP in the last 168 hours. Lipid Profile: No results for input(s): CHOL, HDL, LDLCALC, TRIG, CHOLHDL, LDLDIRECT in the last 72 hours. Thyroid Function Tests: No results for input(s): TSH, T4TOTAL, FREET4, T3FREE, THYROIDAB in the last 72 hours. Anemia Panel: No results for input(s): VITAMINB12, FOLATE, FERRITIN, TIBC, IRON, RETICCTPCT in the last 72 hours. Sepsis Labs: No results for input(s): PROCALCITON, LATICACIDVEN in the last 168 hours.  No results found for this or any previous visit (from the past 240 hour(s)).   Radiology Studies: CT ABDOMEN PELVIS W CONTRAST  Result Date: 04/04/2021 CLINICAL DATA:  Abdominal pain, distention, and constipation for 18 hours. Previous history of bowel obstructions. EXAM: CT ABDOMEN AND PELVIS WITH CONTRAST TECHNIQUE: Multidetector CT imaging of the  abdomen and pelvis was performed using the standard protocol following bolus administration of intravenous contrast. CONTRAST:  138m OMNIPAQUE IOHEXOL 300 MG/ML  SOLN COMPARISON:  09/23/2011 FINDINGS: Lower chest: Lung bases are clear. Hepatobiliary: No focal liver abnormality is seen. No gallstones, gallbladder wall thickening, or biliary dilatation. Pancreas: Unremarkable. No pancreatic ductal dilatation or surrounding inflammatory changes. Spleen: Normal in size without focal abnormality. Adrenals/Urinary Tract: Adrenal glands are unremarkable. Kidneys are normal, without renal calculi, focal lesion, or hydronephrosis. Bladder is unremarkable. Stomach/Bowel: The stomach is decompressed, limiting evaluation. There appears to be some mucosal thickening with crater ring of the pyloric region suggesting a duodenal ulcer. Fluid-filled mildly dilated distal small bowel with mucosal thickening and stricture in the terminal ileum. Change in caliber suggest partial obstruction at this location due to a regional  enteritis. Appearance is likely to represent Crohn disease. The colon is decompressed. No definite colonic wall thickening. Vascular/Lymphatic: No significant vascular findings are present. No enlarged abdominal or pelvic lymph nodes. Reproductive: Uterus and bilateral adnexa are unremarkable. Other: Small amount of free fluid in the abdomen and pelvis. No free air. Abdominal wall musculature appears intact. Musculoskeletal: No acute or significant osseous findings. IMPRESSION: 1. Fluid-filled mildly dilated distal small bowel with mucosal thickening and stricturing in the terminal ileum. Change in caliber suggest partial obstruction at this location due to a regional enteritis. Appearance is likely to represent Crohn disease. 2. Small amount of free fluid in the abdomen and pelvis, likely reactive. 3. Possible duodenal ulcer. Electronically Signed   By: Lucienne Capers M.D.   On: 04/04/2021 00:32    Scheduled  Meds: . amoxicillin-clavulanate  1 tablet Oral Q12H  . enoxaparin (LOVENOX) injection  40 mg Subcutaneous Q24H  . metoprolol succinate  12.5 mg Oral Daily  . pantoprazole (PROTONIX) IV  40 mg Intravenous Q12H  . sertraline  150 mg Oral Daily   Continuous Infusions:   LOS: 0 days   Marylu Lund, MD [facility] Pager On Amion  If 7PM-7AM, please contact night-coverage 04/04/2021, 2:28 PM

## 2021-04-04 NOTE — Plan of Care (Signed)
POC initiated 

## 2021-04-04 NOTE — H&P (Signed)
History and Physical  Patient Name: Carolyn Chaney     RFF:[id number]    DOB: [birth year]    DOA: 04/03/2021 PCP: [name], MD  Patient coming from: Home  Chief Complaint: Abdominal pain, nausea and vomiting    HPI: Carolyn Chaney is a 60 y.o. female, with PMH of iron deficiency anemia secondary to chronic GI bleeding, atrial tachycardia, who presented to the ER on 04/03/2021 secondary to abdominal pain and nausea and vomiting.  Patient states for the past 3 days she has had progressive worsening of abdominal pain, nausea, and vomiting.  The pain has progressively getting worse.  She has had poor p.o. intake.  Has not had a bowel movement since starting.  Vomit is nonbloody.  Nothing seems to improve the pain and this she presented to outside facility for further evaluation.  Of note, patient had a similar episode approximately 22 years ago but at that time was extremely worse.  She required abdominal surgery.  Since then has had no issues.  She is followed by GI and last had a colonoscopy and EGD towards the end of last year and she said that it turned out okay, as pathology was negative.  But apparently there was some visualized inflammation at the terminal ileum.  However has not followed back up with GI due to medical issues with her family.  She also has a history of recurrent GI bleeding with resultant iron deficiency and has required iron transfusions in the past.    ED course: -Vitals on admission: Afebrile, heart rate 101, respiratory rate 18, blood pressure 130/84, maintaining sats on room air -Labs on initial presentation: Sodium 136, potassium 3.9, bicarb 23, glucose 141, BUN 12, creatinine 0.76, WBC 12, hemoglobin 12.1 -Imaging obtained on admission: CT abdomen pelvis with contrast demonstrated partial bowel obstruction, concern for Crohn's, possible duodenal ulcer -In the ED the patient was given IV fluids, morphine, Zofran, Solu-Medrol.  Patient was transferred to [facility] for higher level care.     ROS: A complete and thorough 12 point review of systems obtained, negative listed in HPI.     Past Medical History:  Diagnosis Date  . Anxiety   . Breast mass, right   . Chronic GI bleeding 01/27/2016  . Complication of anesthesia   . Depression   . Dysrhythmia    PVC,s, atrial tachycardia  . Gastric ulcer   . Hiatal hernia   . Iron deficiency anemia 11/07/2011  . Irregular heart rate   . PONV (postoperative nausea and vomiting)   . SBO (small bowel obstruction) ([place])    in 2 different places per patient     Past Surgical History:  Procedure Laterality Date  . APPENDECTOMY    . BREAST EXCISIONAL BIOPSY Right 1980   Benign  . BREAST LUMPECTOMY WITH RADIOACTIVE SEED LOCALIZATION Right 10/09/2019   Procedure: RIGHT BREAST LUMPECTOMY WITH RADIOACTIVE SEED LOCALIZATION;  Surgeon: Fanny Skates, MD;  Location: [facility];  Service: General;  Laterality: Right;  . BREAST SURGERY     x2  . COLONOSCOPY WITH PROPOFOL N/A 02/16/2016   Procedure: COLONOSCOPY WITH PROPOFOL;  Surgeon: Garlan Fair, MD;  Location: [facility];  Service: Endoscopy;  Laterality: N/A;  . ESOPHAGOGASTRODUODENOSCOPY (EGD) WITH PROPOFOL N/A 02/16/2016   Procedure: ESOPHAGOGASTRODUODENOSCOPY (EGD) WITH PROPOFOL;  Surgeon: Garlan Fair, MD;  Location: [facility];  Service: Endoscopy;  Laterality: N/A;  . HERNIA REPAIR     bilateral inguinal hernia as child  .  TONSILLECTOMY      Social History: Patient lives at home.  The patient walks without assistance.  nonsmoker.  Allergies  Allergen Reactions  . Nsaids Other (See Comments)    Bleeding Bleeding    Family history: family history includes Diabetes in her sister; Heart attack in her father and sister; Heart disease in her father; Hiatal hernia in her mother; Lung cancer in her mother; Rectal cancer in her sister.  Prior to Admission medications   Medication Sig Start Date End Date Taking?  Authorizing Provider  acetaminophen (TYLENOL) 500 MG tablet Take 500 mg by mouth every 6 (six) hours as needed for mild pain.   Yes [provider]  ALPRAZolam Duanne Moron) 0.5 MG tablet Take 0.25 mg by mouth daily as needed for anxiety.  10/11/11  Yes LomaxMarny Lowenstein, MD  ascorbic acid (VITAMIN C) 500 MG tablet Take 500 mg by mouth daily as needed.    Yes [provider]  cholecalciferol (VITAMIN D) 1000 units tablet Take 1,000 Units by mouth daily as needed.    Yes [provider]  metoprolol succinate (TOPROL-XL) 25 MG 24 hr tablet Take 1 tablet (25 mg total) by mouth daily. Patient taking differently: Take 25 mg by mouth daily. Take 1/2 a tablet by mouth daily 06/11/20  Yes Tillery, Satira Mccallum, PA-C  sertraline (ZOLOFT) 100 MG tablet Take 150 mg by mouth daily.    Yes [provider]  vitamin B-12 (CYANOCOBALAMIN) 1000 MCG tablet Take 1,000 mcg by mouth daily as needed.    Yes [provider]  sucralfate (CARAFATE) 1 GM/10ML suspension Take 10 mLs (1 g total) by mouth 2 (two) times daily for 28 days. 11/12/20 12/10/20  Cirigliano, Dominic Pea, DO       Physical Exam: BP 130/77   Pulse 75   Temp 98.1 F (36.7 C) (Oral)   Resp 15   Ht 5' 6"  (1.676 m)   Wt 65.8 kg   SpO2 96%   BMI 23.40 kg/m   General appearance: Well-developed, adult female, alert and in no acute distress .   Eyes: Anicteric, conjunctiva pink, lids and lashes normal. PERRL.    ENT: No nasal deformity, discharge, epistaxis.  Hearing intact. OP moist without lesions.   Neck: No neck masses.  Trachea midline.  No thyromegaly/tenderness. Lymph: No cervical or supraclavicular lymphadenopathy. Skin: Warm and dry.  No jaundice.  No suspicious rashes or lesions. Cardiac: RRR, nl S1-S2, no murmurs appreciated.  No LE edema.  Radial and pedal pulses 2+ and symmetric. Respiratory: Normal respiratory rate and rhythm.  CTAB without rales or wheezes. Abdomen: Abdomen soft.  Mildly diffusely  tender, hypoactive bowel sounds MSK: No deformities or effusions of the large joints of the upper or lower extremities bilaterally.  No cyanosis or clubbing. Neuro: Cranial nerves 2 through 12 grossly intact.  Sensation intact to light touch. Speech is fluent.  Marland Kitchen    Psych: Sensorium intact and responding to questions, attention normal.  Behavior appropriate.  Judgment and insight appear normal.    Labs on Admission:  I have personally reviewed following labs and imaging studies: CBC: Recent Labs  Lab 04/03/21 2301  WBC 12.0*  NEUTROABS 11.1*  HGB 12.1  HCT 37.7  MCV 89.8  PLT 785   Basic Metabolic Panel: Recent Labs  Lab 04/03/21 2301  NA 136  K 3.9  CL 101  CO2 23  GLUCOSE 141*  BUN 12  CREATININE 0.76  CALCIUM 9.4   GFR: Estimated Creatinine  Clearance: 70 mL/min (by C-G formula based on SCr of 0.76 mg/dL).  Liver Function Tests: Recent Labs  Lab 04/03/21 2301  AST 21  ALT 21  ALKPHOS 77  BILITOT 0.4  PROT 7.9  ALBUMIN 4.5   Recent Labs  Lab 04/03/21 2301  LIPASE 43   No results for input(s): AMMONIA in the last 168 hours. Coagulation Profile: No results for input(s): INR, PROTIME in the last 168 hours. Cardiac Enzymes: No results for input(s): CKTOTAL, CKMB, CKMBINDEX, TROPONINI in the last 168 hours. BNP (last 3 results) No results for input(s): PROBNP in the last 8760 hours. HbA1C: No results for input(s): HGBA1C in the last 72 hours. CBG: No results for input(s): GLUCAP in the last 168 hours. Lipid Profile: No results for input(s): CHOL, HDL, LDLCALC, TRIG, CHOLHDL, LDLDIRECT in the last 72 hours. Thyroid Function Tests: No results for input(s): TSH, T4TOTAL, FREET4, T3FREE, THYROIDAB in the last 72 hours. Anemia Panel: No results for input(s): VITAMINB12, FOLATE, FERRITIN, TIBC, IRON, RETICCTPCT in the last 72 hours.   No results found for this or any previous visit (from the past 240 hour(s)).         Radiological Exams on  Admission: Personally reviewed imaging which shows: CT abdomen pelvis with contrast demonstrated partial bowel obstruction, concern for Crohn's, possible duodenal ulcer CT ABDOMEN PELVIS W CONTRAST  Result Date: 04/04/2021 CLINICAL DATA:  Abdominal pain, distention, and constipation for 18 hours. Previous history of bowel obstructions. EXAM: CT ABDOMEN AND PELVIS WITH CONTRAST TECHNIQUE: Multidetector CT imaging of the abdomen and pelvis was performed using the standard protocol following bolus administration of intravenous contrast. CONTRAST:  166m OMNIPAQUE IOHEXOL 300 MG/ML  SOLN COMPARISON:  09/23/2011 FINDINGS: Lower chest: Lung bases are clear. Hepatobiliary: No focal liver abnormality is seen. No gallstones, gallbladder wall thickening, or biliary dilatation. Pancreas: Unremarkable. No pancreatic ductal dilatation or surrounding inflammatory changes. Spleen: Normal in size without focal abnormality. Adrenals/Urinary Tract: Adrenal glands are unremarkable. Kidneys are normal, without renal calculi, focal lesion, or hydronephrosis. Bladder is unremarkable. Stomach/Bowel: The stomach is decompressed, limiting evaluation. There appears to be some mucosal thickening with crater ring of the pyloric region suggesting a duodenal ulcer. Fluid-filled mildly dilated distal small bowel with mucosal thickening and stricture in the terminal ileum. Change in caliber suggest partial obstruction at this location due to a regional enteritis. Appearance is likely to represent Crohn disease. The colon is decompressed. No definite colonic wall thickening. Vascular/Lymphatic: No significant vascular findings are present. No enlarged abdominal or pelvic lymph nodes. Reproductive: Uterus and bilateral adnexa are unremarkable. Other: Small amount of free fluid in the abdomen and pelvis. No free air. Abdominal wall musculature appears intact. Musculoskeletal: No acute or significant osseous findings. IMPRESSION: 1. Fluid-filled  mildly dilated distal small bowel with mucosal thickening and stricturing in the terminal ileum. Change in caliber suggest partial obstruction at this location due to a regional enteritis. Appearance is likely to represent Crohn disease. 2. Small amount of free fluid in the abdomen and pelvis, likely reactive. 3. Possible duodenal ulcer. Electronically Signed   By: WLucienne CapersM.D.   On: 04/04/2021 00:32      Assessment/Plan   1.  Intractable nausea and vomiting with abdominal pain - Likely secondary to partial bowel obstruction and inflammation - CT of abdomen and pelvis on admission demonstrated partial obstruction and mucosal thickening and stricturing of the terminal ileum - Continue n.p.o. - We will defer continuing IV steroids to day team - Consider GI consult  in the morning - Antiemetics as warranted - Pain control as warranted - IV fluids  2.  Possible duodenal ulcer - CT on admission showed possible duodenal ulcer.  History of intermittent GI bleeding, will start on Protonix IV  3.  Partial bowel obstruction -See further plans above  4.  Terminal ileum inflammation -See further plans above  5.  History of atrial tachycardia, arrhythmia -Continue home metoprolol     DVT prophylaxis: Lovenox Code Status: Full Family Communication: None Disposition Plan: Anticipate discharge home when medically optimized Consults called: None Admission status: Sedation   At the point of initial evaluation, it is my clinical opinion that admission for OBSERVATION is reasonable and necessary because the patient's presenting complaints in the context of their chronic conditions represent sufficient risk of deterioration or significant morbidity to constitute reasonable grounds for close observation in the hospital setting, but that the patient may be medically stable for discharge from the hospital within 24 to 48 hours.    Medical decision making: Patient seen at 6:11 AM on  04/04/2021. What exists of the patient's chart was reviewed in depth and summarized above.  Clinical condition: Fair.        Doran Heater [facility] Please page though [place] or Epic secure chat:  For password, contact charge nurse

## 2021-04-04 NOTE — ED Notes (Signed)
Jenny Reichmann (Husband) Phone Number [phone] Please contact with any information or concerns.

## 2021-04-05 DIAGNOSIS — F32A Depression, unspecified: Secondary | ICD-10-CM | POA: Diagnosis present

## 2021-04-05 DIAGNOSIS — Z886 Allergy status to analgesic agent status: Secondary | ICD-10-CM | POA: Diagnosis not present

## 2021-04-05 DIAGNOSIS — K56609 Unspecified intestinal obstruction, unspecified as to partial versus complete obstruction: Secondary | ICD-10-CM | POA: Diagnosis present

## 2021-04-05 DIAGNOSIS — K56699 Other intestinal obstruction unspecified as to partial versus complete obstruction: Secondary | ICD-10-CM | POA: Diagnosis not present

## 2021-04-05 DIAGNOSIS — R112 Nausea with vomiting, unspecified: Secondary | ICD-10-CM | POA: Diagnosis not present

## 2021-04-05 DIAGNOSIS — K922 Gastrointestinal hemorrhage, unspecified: Secondary | ICD-10-CM | POA: Diagnosis not present

## 2021-04-05 DIAGNOSIS — Z9049 Acquired absence of other specified parts of digestive tract: Secondary | ICD-10-CM | POA: Diagnosis not present

## 2021-04-05 DIAGNOSIS — Z79899 Other long term (current) drug therapy: Secondary | ICD-10-CM | POA: Diagnosis not present

## 2021-04-05 DIAGNOSIS — Z20822 Contact with and (suspected) exposure to covid-19: Secondary | ICD-10-CM | POA: Diagnosis present

## 2021-04-05 DIAGNOSIS — K5669 Other partial intestinal obstruction: Secondary | ICD-10-CM | POA: Diagnosis present

## 2021-04-05 DIAGNOSIS — F419 Anxiety disorder, unspecified: Secondary | ICD-10-CM | POA: Diagnosis present

## 2021-04-05 DIAGNOSIS — R152 Fecal urgency: Secondary | ICD-10-CM | POA: Diagnosis not present

## 2021-04-05 DIAGNOSIS — D500 Iron deficiency anemia secondary to blood loss (chronic): Secondary | ICD-10-CM | POA: Diagnosis not present

## 2021-04-05 NOTE — Plan of Care (Signed)
  Problem: Activity: Goal: Risk for activity intolerance will decrease Outcome: Progressing   Problem: Elimination: Goal: Will not experience complications related to bowel motility Outcome: Progressing   Problem: Safety: Goal: Ability to remain free from injury will improve Outcome: Progressing   

## 2021-04-05 NOTE — Progress Notes (Signed)
PROGRESS NOTE    Carolyn Chaney  JJO:[id number] DOB: June 08, 1961 DOA: 04/03/2021 PCP: [name], MD    Brief Narrative:  60 y.o. female, with PMH of iron deficiency anemia secondary to chronic GI bleeding, atrial tachycardia, who presented to the ER on 04/03/2021 secondary to abdominal pain and nausea and vomiting.  Patient states for the past 3 days she has had progressive worsening of abdominal pain, nausea, and vomiting.  The pain has progressively getting worse.  She has had poor p.o. intake.  Has not had a bowel movement since starting.  Vomit is nonbloody.  Nothing seems to improve the pain and this she presented to outside facility for further evaluation  Assessment & Plan:   Active Problems:   Iron deficiency anemia   SVT (supraventricular tachycardia)-probable   Chronic GI bleeding   Small bowel obstruction (H[facility])   Abdominal pain   Intractable nausea and vomiting    1.  Intractable nausea and vomiting with abdominal pain - Likely secondary to partial bowel obstruction and inflammation - CT of abdomen and pelvis on admission demonstrated partial obstruction and mucosal thickening and stricturing of the terminal ileum with possible duodenal ulcer on CT - Overall feeling improved, however this weekend, pt did not tolerate much sips - chart reviewed. Pt had recent colon and egd with biopsy results reviewed with pt - results were neg -- Pt was given one dose of solumedrol at time of presentation. Given neg path result, will start empiric augmentin -- Appreciate input by [facility]  2.  Possible duodenal ulcer - CT on admission showed possible duodenal ulcer.  History of intermittent GI bleeding -- Cont protonix. Have stopped prophylactic anticoagulant - Will follow up with GI recs  3.  Partial bowel obstruction -Cont per above  4.  Terminal ileum inflammation -See further plans above  5.  History of atrial tachycardia, arrhythmia -Continue continue on beta  blocker. Currently on IV formulation until patient can reliably tolerate PO   DVT prophylaxis: SCD's Code Status: Full Family Communication: Pt in room, family not at bedside  Status is: Observation  The patient will require care spanning > 2 midnights and should be moved to inpatient because: Ongoing diagnostic testing needed not appropriate for outpatient work up and Inpatient level of care appropriate due to severity of illness  Dispo: The patient is from: Home              Anticipated d/c is to: Home              Patient currently is not medically stable to d/c.   Difficult to place patient No       Consultants:   [facility]  Procedures:     Antimicrobials: Anti-infectives (From admission, onward)   Start     Dose/Rate Route Frequency Ordered Stop   04/04/21 1030  amoxicillin-clavulanate (AUGMENTIN) 875-125 MG per tablet 1 tablet        1 tablet Oral Every 12 hours 04/04/21 0933        Subjective: Reported limited tolerance to PO intake. Is asking for ice chips  Objective: Vitals:   04/04/21 1241 04/04/21 2241 04/05/21 0453 04/05/21 1318  BP: 121/75 131/72 102/60 121/77  Pulse: 63 68 64 62  Resp: 17 18 18 16   Temp: 98.3 F (36.8 C) 97.6 F (36.4 C) 98 F (36.7 C) 97.8 F (36.6 C)  TempSrc: Oral Oral  Oral  SpO2: 99% 97% 97% 100%  Weight:  Height:        Intake/Output Summary (Last 24 hours) at 04/05/2021 1325 Last data filed at 04/05/2021 0600 Gross per 24 hour  Intake 595.63 ml  Output --  Net 595.63 ml   Filed Weights   04/03/21 2242  Weight: 65.8 kg    Examination: General exam: Conversant, in no acute distress Respiratory system: normal chest rise, clear, no audible wheezing Cardiovascular system: regular rhythm, s1-s2 Gastrointestinal system: Nondistended, nontender, pos BS Central nervous system: No seizures, no tremors Extremities: No cyanosis, no joint deformities Skin: No rashes, no pallor Psychiatry: Affect normal // no  auditory hallucinations   Data Reviewed: I have personally reviewed following labs and imaging studies  CBC: Recent Labs  Lab 04/03/21 2301 04/04/21 0821  WBC 12.0* 6.5  NEUTROABS 11.1*  --   HGB 12.1 10.5*  HCT 37.7 33.2*  MCV 89.8 91.7  PLT 336 093   Basic Metabolic Panel: Recent Labs  Lab 04/03/21 2301 04/04/21 0821  NA 136 142  K 3.9 4.1  CL 101 107  CO2 23 26  GLUCOSE 141* 162*  BUN 12 11  CREATININE 0.76 0.78  CALCIUM 9.4 9.6  MG  --  2.2  PHOS  --  3.6   GFR: Estimated Creatinine Clearance: 70 mL/min (by C-G formula based on SCr of 0.78 mg/dL). Liver Function Tests: Recent Labs  Lab 04/03/21 2301 04/04/21 0821  AST 21 17  ALT 21 18  ALKPHOS 77 70  BILITOT 0.4 0.3  PROT 7.9 7.0  ALBUMIN 4.5 3.9   Recent Labs  Lab 04/03/21 2301  LIPASE 43   No results for input(s): AMMONIA in the last 168 hours. Coagulation Profile: No results for input(s): INR, PROTIME in the last 168 hours. Cardiac Enzymes: No results for input(s): CKTOTAL, CKMB, CKMBINDEX, TROPONINI in the last 168 hours. BNP (last 3 results) No results for input(s): PROBNP in the last 8760 hours. HbA1C: No results for input(s): HGBA1C in the last 72 hours. CBG: No results for input(s): GLUCAP in the last 168 hours. Lipid Profile: No results for input(s): CHOL, HDL, LDLCALC, TRIG, CHOLHDL, LDLDIRECT in the last 72 hours. Thyroid Function Tests: No results for input(s): TSH, T4TOTAL, FREET4, T3FREE, THYROIDAB in the last 72 hours. Anemia Panel: No results for input(s): VITAMINB12, FOLATE, FERRITIN, TIBC, IRON, RETICCTPCT in the last 72 hours. Sepsis Labs: No results for input(s): PROCALCITON, LATICACIDVEN in the last 168 hours.  Recent Results (from the past 240 hour(s))  SARS CORONAVIRUS 2 (TAT 6-24 HRS) Nasopharyngeal Nasopharyngeal Swab     Status: None   Collection Time: 04/04/21  1:40 AM   Specimen: Nasopharyngeal Swab  Result Value Ref Range Status   SARS Coronavirus 2 NEGATIVE  NEGATIVE Final    Comment: (NOTE) SARS-CoV-2 target nucleic acids are NOT DETECTED.  The SARS-CoV-2 RNA is generally detectable in upper and lower respiratory specimens during the acute phase of infection. Negative results do not preclude SARS-CoV-2 infection, do not rule out co-infections with other pathogens, and should not be used as the sole basis for treatment or other patient management decisions. Negative results must be combined with clinical observations, patient history, and epidemiological information. The expected result is Negative.  Fact Sheet for Patients: SugarRoll.be  Fact Sheet for Healthcare Providers: https://www.woods-mathews.com/  This test is not yet approved or cleared by the [place] FDA and  has been authorized for detection and/or diagnosis of SARS-CoV-2 by FDA under an Emergency Use Authorization (EUA). This EUA will remain  in effect (  meaning this test can be used) for the duration of the COVID-19 declaration under Se ction 564(b)(1) of the Act, 21 U.S.C. section 360bbb-3(b)(1), unless the authorization is terminated or revoked sooner.  Performed at [facility], [place] [address] [place], [place] [address]      Radiology Studies: CT ABDOMEN PELVIS W CONTRAST  Result Date: 04/04/2021 CLINICAL DATA:  Abdominal pain, distention, and constipation for 18 hours. Previous history of bowel obstructions. EXAM: CT ABDOMEN AND PELVIS WITH CONTRAST TECHNIQUE: Multidetector CT imaging of the abdomen and pelvis was performed using the standard protocol following bolus administration of intravenous contrast. CONTRAST:  157m OMNIPAQUE IOHEXOL 300 MG/ML  SOLN COMPARISON:  09/23/2011 FINDINGS: Lower chest: Lung bases are clear. Hepatobiliary: No focal liver abnormality is seen. No gallstones, gallbladder wall thickening, or biliary dilatation. Pancreas: Unremarkable. No pancreatic ductal dilatation or surrounding  inflammatory changes. Spleen: Normal in size without focal abnormality. Adrenals/Urinary Tract: Adrenal glands are unremarkable. Kidneys are normal, without renal calculi, focal lesion, or hydronephrosis. Bladder is unremarkable. Stomach/Bowel: The stomach is decompressed, limiting evaluation. There appears to be some mucosal thickening with crater ring of the pyloric region suggesting a duodenal ulcer. Fluid-filled mildly dilated distal small bowel with mucosal thickening and stricture in the terminal ileum. Change in caliber suggest partial obstruction at this location due to a regional enteritis. Appearance is likely to represent Crohn disease. The colon is decompressed. No definite colonic wall thickening. Vascular/Lymphatic: No significant vascular findings are present. No enlarged abdominal or pelvic lymph nodes. Reproductive: Uterus and bilateral adnexa are unremarkable. Other: Small amount of free fluid in the abdomen and pelvis. No free air. Abdominal wall musculature appears intact. Musculoskeletal: No acute or significant osseous findings. IMPRESSION: 1. Fluid-filled mildly dilated distal small bowel with mucosal thickening and stricturing in the terminal ileum. Change in caliber suggest partial obstruction at this location due to a regional enteritis. Appearance is likely to represent Crohn disease. 2. Small amount of free fluid in the abdomen and pelvis, likely reactive. 3. Possible duodenal ulcer. Electronically Signed   By: WLucienne CapersM.D.   On: 04/04/2021 00:32    Scheduled Meds: . amoxicillin-clavulanate  1 tablet Oral Q12H  . enoxaparin (LOVENOX) injection  40 mg Subcutaneous Q24H  . metoprolol tartrate  5 mg Intravenous Q6H  . pantoprazole (PROTONIX) IV  40 mg Intravenous Q12H  . sertraline  150 mg Oral Daily   Continuous Infusions: . sodium chloride 75 mL/hr at 04/05/21 0350     LOS: 0 days   SMarylu Lund MD [facility] Pager On Amion  If 7PM-7AM, please contact  night-coverage 04/05/2021, 1:25 PM

## 2021-04-05 NOTE — Consult Note (Addendum)
Consultation  Referring Provider:   Dr. Wyline Copas Primary Care Physician:  [name], MD Primary Gastroenterologist:    Dr. Bryan Lemma     Reason for Consultation: Abdominal pain, nausea, vomiting, partial SBO           HPI:   Carolyn Chaney is a 60 y.o. female with a past medical history of iron deficiency anemia secondary to chronic GI bleeding, atrial tachycardia and others listed below, who presented to the ER on 04/03/2021 secondary to abdominal pain, nausea and vomiting.    Today, patient explains that about 23 years ago she had similar symptoms of abdominal pain which worsened over about 8 days until she eventually presented to the hospital and was found to have a bowel obstruction and "I had my appendix out", but "apparently that was not the problem" and things then resolved on their own.  Tells me that since then she has off-and-on pain just above where "my pants hit/my bellybutton".  Tells me that this is severe pain which will come and go, rated anywhere from an 8-09/2009, occasionally but she just deals with it.  This past Friday, 04/02/2021 the patient ate a quinoa bowl for supper and" it was a lot of food" and then went to the movies and had popcorn.  She was not feeling good that day and had actually been having some of her pains the few days before, but was able to go to sleep.  She woke up at some point that night with nausea and vomiting and worsened abdominal pain.  Tells me this worsened to the point when she presented to the [facility] ER and then was transferred over here yesterday.  Describes she is not even sure when she had her last bowel movement and is not passing gas at all.  "Nothing moving in there".  Typically she will have a bowel movement every 3 days or so and never feels like she gets fully emptied.  Describes that as long as she is not eating anything she has no further abdominal pain or nausea, but the second she tries anything in her mouth she gets severely  nauseous, just like yesterday when she tried clears on 2 separate occasions.    Has history of "severe NSAID use" that started at the age of 72 for migraine headaches.  Apparently in the past her GI issues were related to this, tells me she now does not use them at all.    Denies fever, chills, weight loss or blood in her stool.  ER course: CT abdomen pelvis with contrast with fluid-filled mildly dilated distal small bowel with mucosal thickening and stricturing in the terminal ileum, change in caliber suggestive partial obstruction due to a regional enteritis, small amount of free fluid in the abdomen and pelvis, possible duodenal ulcer, WBC 12, hemoglobin 12.1, given IV fluids, morphine, Zofran and Solu-Medrol  GI history: 11/12/2020 colonoscopy with nonbleeding internal hemorrhoids, small and grade 1, moderate stenosis of the ileocecal valve which was eventually traversed, second stenosis approximately 2 cm proximal to the ICV which could not be traversed, visualized mucosa in the terminal ileum was erythematous and with ulcer x2,; at that time depending on biopsy results may plan for a trial of budesonide/possibly repeat CTE/MRE for further small bowel interrogation; pathology showed no active or chronic inflammatory changes to support or suggest Crohn's disease 11/12/2020 EGD with LA grade a reflux esophagitis and gastritis; placed on pantoprazole 40 twice a day and Carafate; pathology showed  mild gastritis with no evidence of H. pylori 09/16/2020 office visit DO Louin for IDA, that time arrange for EGD/push enteroscopy and colonoscopy  Past Medical History:  Diagnosis Date  . Anxiety   . Breast mass, right   . Chronic GI bleeding 01/27/2016  . Complication of anesthesia   . Depression   . Dysrhythmia    PVC,s, atrial tachycardia  . Gastric ulcer   . Hiatal hernia   . Iron deficiency anemia 11/07/2011  . Irregular heart rate   . PONV (postoperative nausea and vomiting)   . SBO (small  bowel obstruction) ([place])    in 2 different places per patient     Past Surgical History:  Procedure Laterality Date  . APPENDECTOMY    . BREAST EXCISIONAL BIOPSY Right 1980   Benign  . BREAST LUMPECTOMY WITH RADIOACTIVE SEED LOCALIZATION Right 10/09/2019   Procedure: RIGHT BREAST LUMPECTOMY WITH RADIOACTIVE SEED LOCALIZATION;  Surgeon: Fanny Skates, MD;  Location: [facility];  Service: General;  Laterality: Right;  . BREAST SURGERY     x2  . COLONOSCOPY WITH PROPOFOL N/A 02/16/2016   Procedure: COLONOSCOPY WITH PROPOFOL;  Surgeon: Garlan Fair, MD;  Location: [facility];  Service: Endoscopy;  Laterality: N/A;  . ESOPHAGOGASTRODUODENOSCOPY (EGD) WITH PROPOFOL N/A 02/16/2016   Procedure: ESOPHAGOGASTRODUODENOSCOPY (EGD) WITH PROPOFOL;  Surgeon: Garlan Fair, MD;  Location: [facility];  Service: Endoscopy;  Laterality: N/A;  . HERNIA REPAIR     bilateral inguinal hernia as child  . TONSILLECTOMY      Family History  Problem Relation Age of Onset  . Lung cancer Mother   . Hiatal hernia Mother   . Heart attack Father   . Heart disease Father   . Heart attack Sister   . Rectal cancer Sister   . Diabetes Sister        type 2  . Esophageal cancer Neg Hx      Social History   Tobacco Use  . Smoking status: Never Smoker  . Smokeless tobacco: Never Used  Vaping Use  . Vaping Use: Never used  Substance Use Topics  . Alcohol use: Yes    Alcohol/week: 0.0 standard drinks    Comment: ocassionally  . Drug use: No    Prior to Admission medications   Medication Sig Start Date End Date Taking? Authorizing Provider  acetaminophen (TYLENOL) 500 MG tablet Take 500 mg by mouth every 6 (six) hours as needed for mild pain.   Yes [provider]  ALPRAZolam Duanne Moron) 0.5 MG tablet Take 0.25 mg by mouth daily as needed for anxiety.  10/11/11  Yes LomaxMarny Lowenstein, MD  ascorbic acid (VITAMIN C) 500 MG tablet Take 500 mg by mouth daily as needed.    Yes  [provider]  cholecalciferol (VITAMIN D) 1000 units tablet Take 1,000 Units by mouth daily as needed.    Yes [provider]  metoprolol succinate (TOPROL-XL) 25 MG 24 hr tablet Take 1 tablet (25 mg total) by mouth daily. Patient taking differently: Take 25 mg by mouth daily. Take 1/2 a tablet by mouth daily 06/11/20  Yes Tillery, Satira Mccallum, PA-C  sertraline (ZOLOFT) 100 MG tablet Take 150 mg by mouth daily.    Yes [provider]  vitamin B-12 (CYANOCOBALAMIN) 1000 MCG tablet Take 1,000 mcg by mouth daily as needed.    Yes [provider]  sucralfate (CARAFATE) 1 GM/10ML suspension Take 10 mLs (1 g total) by mouth 2 (two) times daily  for 28 days. 11/12/20 12/10/20  Cirigliano, Dominic Pea, DO    Current Facility-Administered Medications  Medication Dose Route Frequency Provider Last Rate Last Admin  . 0.9 %  sodium chloride infusion   Intravenous Continuous Donne Hazel, MD 75 mL/hr at 04/05/21 0350 New Bag at 04/05/21 0350  . acetaminophen (TYLENOL) tablet 650 mg  650 mg Oral Q6H PRN Doran Heater, DO       Or  . acetaminophen (TYLENOL) suppository 650 mg  650 mg Rectal Q6H PRN MacNeil, Richard G, DO      . albuterol (PROVENTIL) (2.5 MG/3ML) 0.083% nebulizer solution 2.5 mg  2.5 mg Nebulization Q2H PRN Doran Heater, DO      . amoxicillin-clavulanate (AUGMENTIN) 875-125 MG per tablet 1 tablet  1 tablet Oral Q12H Donne Hazel, MD   1 tablet at 04/04/21 2147  . enoxaparin (LOVENOX) injection 40 mg  40 mg Subcutaneous Q24H MacNeil, Richard G, DO      . metoprolol tartrate (LOPRESSOR) injection 5 mg  5 mg Intravenous Q6H Donne Hazel, MD   5 mg at 04/05/21 [phone]  . morphine 2 MG/ML injection 2 mg  2 mg Intravenous Q2H PRN Doran Heater, DO   2 mg at 04/04/21 1539  . ondansetron (ZOFRAN) tablet 4 mg  4 mg Oral Q6H PRN Doran Heater, DO       Or  . ondansetron (ZOFRAN) injection 4 mg  4 mg Intravenous Q6H PRN Doran Heater, DO    4 mg at 04/04/21 1250  . pantoprazole (PROTONIX) injection 40 mg  40 mg Intravenous Q12H Doran Heater, DO   40 mg at 04/04/21 2148  . prochlorperazine (COMPAZINE) injection 5 mg  5 mg Intravenous Q6H PRN Donne Hazel, MD      . sertraline (ZOLOFT) tablet 150 mg  150 mg Oral Daily Doran Heater, DO       Facility-Administered Medications Ordered in Other Encounters  Medication Dose Route Frequency Provider Last Rate Last Admin  . diphenhydrAMINE (BENADRYL) capsule 25 mg  25 mg Oral Once Mango, Holli Humbles, NP        Allergies as of 04/03/2021 - Review Complete 04/03/2021  Allergen Reaction Noted  . Nsaids Other (See Comments) 09/25/2012     Review of Systems:    Constitutional: No weight loss, fever or chills Skin: No rash  Cardiovascular: No chest pain Respiratory: No SOB Gastrointestinal: See HPI and otherwise negative Genitourinary: No dysuria Neurological: No headache, dizziness or syncope Musculoskeletal: No new muscle or joint pain Hematologic: No bleeding  Psychiatric: No history of depression or anxiety    Physical Exam:  Vital signs in last 24 hours: Temp:  [97.6 F (36.4 C)-98.3 F (36.8 C)] 98 F (36.7 C) (04/25 0453) Pulse Rate:  [63-68] 64 (04/25 0453) Resp:  [17-18] 18 (04/25 0453) BP: (102-131)/(60-75) 102/60 (04/25 0453) SpO2:  [97 %-99 %] 97 % (04/25 0453) Last BM Date: 03/31/21 General:   Pleasant Caucasian female appears to be in NAD, Well developed, Well nourished, alert and cooperative Head:  Normocephalic and atraumatic. Eyes:   PEERL, EOMI. No icterus. Conjunctiva pink. Ears:  Normal auditory acuity. Neck:  Supple Throat: Oral cavity and pharynx without inflammation, swelling or lesion. Teeth in good condition. Lungs: Respirations even and unlabored. Lungs clear to auscultation bilaterally.   No wheezes, crackles, or rhonchi.  Heart: Normal S1, S2. No MRG. Regular rate and rhythm. No peripheral edema, cyanosis or pallor.  Abdomen:  Soft, nondistended, nontender. No rebound or guarding.  Decreased bowel sounds in upper quadrants, tinkling bowel sounds in right lower quadrant. No appreciable masses or hepatomegaly. Rectal:  Not performed.  Msk:  Symmetrical without gross deformities. Peripheral pulses intact.  Extremities:  Without edema, no deformity or joint abnormality. Neurologic:  Alert and  oriented x4;  grossly normal neurologically.  Skin:   Dry and intact without significant lesions or rashes. Psychiatric: Demonstrates good judgement and reason without abnormal affect or behaviors.   LAB RESULTS: Recent Labs    04/03/21 2301 04/04/21 0821  WBC 12.0* 6.5  HGB 12.1 10.5*  HCT 37.7 33.2*  PLT 336 254   BMET Recent Labs    04/03/21 2301 04/04/21 0821  NA 136 142  K 3.9 4.1  CL 101 107  CO2 23 26  GLUCOSE 141* 162*  BUN 12 11  CREATININE 0.76 0.78  CALCIUM 9.4 9.6   LFT Recent Labs    04/04/21 0821  PROT 7.0  ALBUMIN 3.9  AST 17  ALT 18  ALKPHOS 70  BILITOT 0.3   STUDIES: CT ABDOMEN PELVIS W CONTRAST  Result Date: 04/04/2021 CLINICAL DATA:  Abdominal pain, distention, and constipation for 18 hours. Previous history of bowel obstructions. EXAM: CT ABDOMEN AND PELVIS WITH CONTRAST TECHNIQUE: Multidetector CT imaging of the abdomen and pelvis was performed using the standard protocol following bolus administration of intravenous contrast. CONTRAST:  124m OMNIPAQUE IOHEXOL 300 MG/ML  SOLN COMPARISON:  09/23/2011 FINDINGS: Lower chest: Lung bases are clear. Hepatobiliary: No focal liver abnormality is seen. No gallstones, gallbladder wall thickening, or biliary dilatation. Pancreas: Unremarkable. No pancreatic ductal dilatation or surrounding inflammatory changes. Spleen: Normal in size without focal abnormality. Adrenals/Urinary Tract: Adrenal glands are unremarkable. Kidneys are normal, without renal calculi, focal lesion, or hydronephrosis. Bladder is unremarkable. Stomach/Bowel: The stomach is  decompressed, limiting evaluation. There appears to be some mucosal thickening with crater ring of the pyloric region suggesting a duodenal ulcer. Fluid-filled mildly dilated distal small bowel with mucosal thickening and stricture in the terminal ileum. Change in caliber suggest partial obstruction at this location due to a regional enteritis. Appearance is likely to represent Crohn disease. The colon is decompressed. No definite colonic wall thickening. Vascular/Lymphatic: No significant vascular findings are present. No enlarged abdominal or pelvic lymph nodes. Reproductive: Uterus and bilateral adnexa are unremarkable. Other: Small amount of free fluid in the abdomen and pelvis. No free air. Abdominal wall musculature appears intact. Musculoskeletal: No acute or significant osseous findings. IMPRESSION: 1. Fluid-filled mildly dilated distal small bowel with mucosal thickening and stricturing in the terminal ileum. Change in caliber suggest partial obstruction at this location due to a regional enteritis. Appearance is likely to represent Crohn disease. 2. Small amount of free fluid in the abdomen and pelvis, likely reactive. 3. Possible duodenal ulcer. Electronically Signed   By: WLucienne CapersM.D.   On: 04/04/2021 00:32    Impression / Plan:   Impression: 1.  Intractable nausea and vomiting with abdominal pain: Partial small bowel obstruction seen on CT at time of admission, likely giving symptoms, some of this is chronic for the patient with her last colonoscopy showing stricturing just past the ileocecal valve, biopsies negative for signs of Crohn's at that time 2.  Partial small bowel obstruction 3.  Possible duodenal ulcer  Plan: 1.  Patient would likely benefit from a CTE for further evaluation of small bowel and to assess how long the stricturing is and if she would be a  candidate for surgery versus balloon dilation of this area.  Apparently this was discussed with her at time of her  colonoscopy. 2.  Patient can be on clears as tolerated 3.  Continue antiemetics 4.  Agree with Pantoprazole 40 mg every 12 5.  Please await further recommendations from Dr. Tarri Glenn later today  Thank you for your kind consultation, we will continue to follow.  Lavone Nian [facility]  04/05/2021, 8:56 AM

## 2021-04-05 NOTE — Plan of Care (Signed)
Plan of care reviewed and discussed with the patient. 

## 2021-04-06 LAB — COMPREHENSIVE METABOLIC PANEL
ALT: 16 U/L (ref 0–44)
AST: 14 U/L — ABNORMAL LOW (ref 15–41)
Albumin: 3.8 g/dL (ref 3.5–5.0)
Alkaline Phosphatase: 56 U/L (ref 38–126)
Anion gap: 6 (ref 5–15)
BUN: 11 mg/dL (ref 6–20)
CO2: 30 mmol/L (ref 22–32)
Calcium: 9 mg/dL (ref 8.9–10.3)
Chloride: 106 mmol/L (ref 98–111)
Creatinine, Ser: 0.7 mg/dL (ref 0.44–1.00)
GFR, Estimated: >60 mL/min (ref >60)
Glucose, Bld: 87 mg/dL (ref 70–99)
Potassium: 3.5 mmol/L (ref 3.5–5.1)
Sodium: 142 mmol/L (ref 135–145)
Total Bilirubin: 0.3 mg/dL (ref 0.3–1.2)
Total Protein: 6.5 g/dL (ref 6.5–8.1)

## 2021-04-06 LAB — CBC
HCT: 31.4 % — ABNORMAL LOW (ref 36.0–46.0)
Hemoglobin: 9.8 g/dL — ABNORMAL LOW (ref 12.0–15.0)
MCH: 29.3 pg (ref 26.0–34.0)
MCHC: 31.2 g/dL (ref 30.0–36.0)
MCV: 94 fL (ref 80.0–100.0)
Platelets: 246 10*3/uL (ref 150–400)
RBC: 3.34 MIL/uL — ABNORMAL LOW (ref 3.87–5.11)
RDW: 12.6 % (ref 11.5–15.5)
WBC: 5.1 10*3/uL (ref 4.0–10.5)
nRBC: 0 % (ref 0.0–0.2)

## 2021-04-06 MED ORDER — POTASSIUM CHLORIDE CRYS ER 20 MEQ PO TBCR: 40.0000 meq | EXTENDED_RELEASE_TABLET | Freq: Once | ORAL | Status: DC

## 2021-04-06 MED ORDER — ALPRAZOLAM 0.25 MG PO TABS: 0.2500 mg | ORAL_TABLET | Freq: Every day | ORAL | Status: DC | PRN

## 2021-04-06 NOTE — Progress Notes (Signed)
    Progress Note   Subjective  Chief Complaint: Abdominal pain, nausea, vomiting, partial SBO  Today, the patient tells me that she was passing a small amount of gas last night which is some improvement.  Does tell me she still has some discomfort right above the area where her pants hit/bellybutton.  This is "maybe slightly different but not necessarily better" than it was before.  Tells me though that she is very hungry and would like to try something other than chicken broth etc.  Apparently has been tolerating her cranberry juice and ice chips etc.   Objective   Vital signs in last 24 hours: Temp:  [97.8 F (36.6 C)-98.2 F (36.8 C)] 98.2 F (36.8 C) (04/26 0615) Pulse Rate:  [62-72] 72 (04/26 0615) Resp:  [16-18] 18 (04/26 0615) BP: (118-128)/(67-77) 118/67 (04/26 0615) SpO2:  [97 %-100 %] 97 % (04/26 0615) Last BM Date: 03/31/21 General:  White female in NAD Heart:  Regular rate and rhythm; no murmurs Lungs: Respirations even and unlabored, lungs CTA bilaterally Abdomen:  Soft, nontender and nondistended.  Increased bowel sounds in comparison to yesterday. Extremities:  Without edema. Neurologic:  Alert and oriented,  grossly normal neurologically. Psych:  Cooperative. Normal mood and affect.  Intake/Output from previous day: 04/25 0701 - 04/26 0700 In: 930.1 [P.O.:30; I.V.:900.1] Out: -   Lab Results: Recent Labs    04/03/21 2301 04/04/21 0821 04/06/21 0341  WBC 12.0* 6.5 5.1  HGB 12.1 10.5* 9.8*  HCT 37.7 33.2* 31.4*  PLT 336 254 246   BMET Recent Labs    04/03/21 2301 04/04/21 0821 04/06/21 0341  NA 136 142 142  K 3.9 4.1 3.5  CL 101 107 106  CO2 23 26 30   GLUCOSE 141* 162* 87  BUN 12 11 11   CREATININE 0.76 0.78 0.70  CALCIUM 9.4 9.6 9.0   LFT Recent Labs    04/06/21 0341  PROT 6.5  ALBUMIN 3.8  AST 14*  ALT 16  ALKPHOS 56  BILITOT 0.3    Assessment / Plan:   Assessment: 1.  Intractable nausea and vomiting with abdominal pain: Related  to below the likely from NSAID strictures, improving some today with passage of gas overnight 2.  Partial small bowel obstruction 3.  Possible duodenal ulcer  Plan: 1.  Continue supportive measures.  Advance diet to full liquids today and told her to eat as tolerated. 2.  Again when SBO resolves will consider CTE/MRI to further characterize the strictures and to determine if she will be a candidate for balloon dilation vs surgical resection. 3.  Recommend PPI twice daily x8 weeks 4.  Please await any further recommendations from Dr. Tarri Glenn  Thank you for your kind consultation, we will continue to follow.   LOS: 1 day   Levin Erp  04/06/2021, 9:38 AM

## 2021-04-06 NOTE — Progress Notes (Addendum)
Carolyn Chaney  OFB:[id number] DOB: 20-Oct-1961 DOA: 04/03/2021 PCP: [name], MD    Brief Narrative:  60 year old with a history of chronic GI bleeding and subsequent iron deficiency anemia, prior small bowel obstructions, and atrial tachyarrhythmia who presented to the ED 4/23 with a 3-day history of severe generalized abdominal pain nausea and vomiting. CT abdomen in the ER revealed a partial small bowel obstruction.  Consultants:  [facility]  Code Status: FULL CODE  Antimicrobials:  Augmentin 4/24 > 4/26  DVT prophylaxis: SCDs  Subjective: Afebrile.  Vital signs stable.  Reports some modest increases distention and discomfort in bilateral lower abdominal quadrants.  Reports some intermittent nausea but no significant vomiting.  No chest pain or shortness of breath.  Assessment & Plan:  Partial small bowel obstruction - Hx of same 1999 Confirmed on CT abdomen/pelvis with findings concerning for stricture of the terminal ileum and possible duodenal ulcer - GI suspects findings are related to NSAID-associated strictures - consider CTE or MRE to further characterize the strictures once PSBO resolved - may ultimately need surgical resection - abstain from all NSAIDs -slowly advance diet as tolerated with change to full liquid today - w/ no clear evidence of an acute infection, and with no definitive evidence that abx are helpful in a typical PSBO, I will d/c her abx coverage after her last dose today  Inflammation of terminal ileum  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 with pathology showing nonspecific ulcer - more recent colonoscopy 11/2020 showed strictured terminal ileum but biopsies were normal    Possible duodenal ulcer Suggested by CT at time of admission - reports history of intermittent GI bleeding - cont protonix - GI following - EGD 11/12/20 showed gastritis, esophagitis, and heme in the fundus - PPI BID x 8 weeks   Acute / Subacute blood loss anemia Hgb drifting  down, likely due primarily to dilution - follow trend - transfuse as needed to keep Hgb 7.0 -discontinue Lovenox and utilize SCDs only until hemoglobin proves to stabilize  Recent Labs  Lab 04/03/21 2301 04/04/21 0821 04/06/21 0341  HGB 12.1 10.5* 9.8*   Hx of atrial tachycardia  continue usual BB - rate controlled at present    Family Communication:  Status is: Inpatient  Remains inpatient appropriate because:Inpatient level of care appropriate due to severity of illness   Dispo: The patient is from: Home              Anticipated d/c is to: Home              Patient currently is not medically stable to d/c.   Difficult to place patient No   Objective: Blood pressure 118/67, pulse 72, temperature 98.2 F (36.8 C), temperature source Oral, resp. rate 18, height 5' 6"  (1.676 m), weight 65.8 kg, SpO2 97 %.  Intake/Output Summary (Last 24 hours) at 04/06/2021 0729 Last data filed at 04/06/2021 0600 Gross per 24 hour  Intake 930.1 ml  Output --  Net 930.1 ml   Filed Weights   04/03/21 2242  Weight: 65.8 kg    Examination: General: No acute respiratory distress Lungs: Clear to auscultation bilaterally without wheezes or crackles Cardiovascular: Regular rate and rhythm without murmur gallop or rub normal S1 and S2 Abdomen: Nontender, nondistended, soft, bowel sounds positive, no rebound, no ascites, no appreciable mass Extremities: No significant cyanosis, clubbing, or edema bilateral lower extremities  CBC: Recent Labs  Lab 04/03/21 2301 04/04/21 0821 04/06/21 0341  WBC 12.0* 6.5 5.1  NEUTROABS 11.1*  --   --   HGB 12.1 10.5* 9.8*  HCT 37.7 33.2* 31.4*  MCV 89.8 91.7 94.0  PLT 336 254 063   Basic Metabolic Panel: Recent Labs  Lab 04/03/21 2301 04/04/21 0821 04/06/21 0341  NA 136 142 142  K 3.9 4.1 3.5  CL 101 107 106  CO2 23 26 30   GLUCOSE 141* 162* 87  BUN 12 11 11   CREATININE 0.76 0.78 0.70  CALCIUM 9.4 9.6 9.0  MG  --  2.2  --   PHOS  --  3.6  --     GFR: Estimated Creatinine Clearance: 70 mL/min (by C-G formula based on SCr of 0.7 mg/dL).  Liver Function Tests: Recent Labs  Lab 04/03/21 2301 04/04/21 0821 04/06/21 0341  AST 21 17 14*  ALT 21 18 16   ALKPHOS 77 70 56  BILITOT 0.4 0.3 0.3  PROT 7.9 7.0 6.5  ALBUMIN 4.5 3.9 3.8   Recent Labs  Lab 04/03/21 2301  LIPASE 43     Recent Results (from the past 240 hour(s))  SARS CORONAVIRUS 2 (TAT 6-24 HRS) Nasopharyngeal Nasopharyngeal Swab     Status: None   Collection Time: 04/04/21  1:40 AM   Specimen: Nasopharyngeal Swab  Result Value Ref Range Status   SARS Coronavirus 2 NEGATIVE NEGATIVE Final    Comment: (NOTE) SARS-CoV-2 target nucleic acids are NOT DETECTED.  The SARS-CoV-2 RNA is generally detectable in upper and lower respiratory specimens during the acute phase of infection. Negative results do not preclude SARS-CoV-2 infection, do not rule out co-infections with other pathogens, and should not be used as the sole basis for treatment or other patient management decisions. Negative results must be combined with clinical observations, patient history, and epidemiological information. The expected result is Negative.  Fact Sheet for Patients: SugarRoll.be  Fact Sheet for Healthcare Providers: https://www.woods-mathews.com/  This test is not yet approved or cleared by the [place] FDA and  has been authorized for detection and/or diagnosis of SARS-CoV-2 by FDA under an Emergency Use Authorization (EUA). This EUA will remain  in effect (meaning this test can be used) for the duration of the COVID-19 declaration under Se ction 564(b)(1) of the Act, 21 U.S.C. section 360bbb-3(b)(1), unless the authorization is terminated or revoked sooner.  Performed at [facility], [place] [address] [place], [place] [address]      Scheduled Meds: . amoxicillin-clavulanate  1 tablet Oral Q12H  . enoxaparin (LOVENOX)  injection  40 mg Subcutaneous Q24H  . metoprolol tartrate  5 mg Intravenous Q6H  . pantoprazole (PROTONIX) IV  40 mg Intravenous Q12H  . sertraline  150 mg Oral Daily   Continuous Infusions: . sodium chloride 75 mL/hr at 04/05/21 1724     LOS: 1 day   Cherene Altes, MD [facility] Office  [phone] Pager - Text Page per Shea Evans  If 7PM-7AM, please contact night-coverage per Amion 04/06/2021, 7:29 AM

## 2021-04-07 ENCOUNTER — Inpatient Hospital Stay (HOSPITAL_COMMUNITY): Payer: 59

## 2021-04-07 LAB — COMPREHENSIVE METABOLIC PANEL
ALT: 15 U/L (ref 0–44)
AST: 13 U/L — ABNORMAL LOW (ref 15–41)
Albumin: 3.6 g/dL (ref 3.5–5.0)
Alkaline Phosphatase: 56 U/L (ref 38–126)
Anion gap: 9 (ref 5–15)
BUN: 9 mg/dL (ref 6–20)
CO2: 24 mmol/L (ref 22–32)
Calcium: 8.9 mg/dL (ref 8.9–10.3)
Chloride: 109 mmol/L (ref 98–111)
Creatinine, Ser: 0.69 mg/dL (ref 0.44–1.00)
GFR, Estimated: >60 mL/min (ref >60)
Glucose, Bld: 93 mg/dL (ref 70–99)
Potassium: 3.4 mmol/L — ABNORMAL LOW (ref 3.5–5.1)
Sodium: 142 mmol/L (ref 135–145)
Total Bilirubin: 0.4 mg/dL (ref 0.3–1.2)
Total Protein: 6.2 g/dL — ABNORMAL LOW (ref 6.5–8.1)

## 2021-04-07 LAB — CBC
HCT: 30.6 % — ABNORMAL LOW (ref 36.0–46.0)
Hemoglobin: 9.6 g/dL — ABNORMAL LOW (ref 12.0–15.0)
MCH: 28.8 pg (ref 26.0–34.0)
MCHC: 31.4 g/dL (ref 30.0–36.0)
MCV: 91.9 fL (ref 80.0–100.0)
Platelets: 238 10*3/uL (ref 150–400)
RBC: 3.33 MIL/uL — ABNORMAL LOW (ref 3.87–5.11)
RDW: 12.5 % (ref 11.5–15.5)
WBC: 5.4 10*3/uL (ref 4.0–10.5)
nRBC: 0 % (ref 0.0–0.2)

## 2021-04-07 LAB — VITAMIN B12: Vitamin B-12: 1653 pg/mL — ABNORMAL HIGH (ref 180–914)

## 2021-04-07 LAB — IRON AND TIBC
Iron: 29 ug/dL (ref 28–170)
Saturation Ratios: 8 % — ABNORMAL LOW (ref 10.4–31.8)
TIBC: 385 ug/dL (ref 250–450)
UIBC: 356 ug/dL

## 2021-04-07 LAB — FOLATE: Folate: 10.7 ng/mL (ref >5.9)

## 2021-04-07 LAB — FERRITIN: Ferritin: 7 ng/mL — ABNORMAL LOW (ref 11–307)

## 2021-04-07 LAB — RETICULOCYTES
Immature Retic Fract: 10.9 % (ref 2.3–15.9)
RBC.: 3.34 MIL/uL — ABNORMAL LOW (ref 3.87–5.11)
Retic Count, Absolute: 38.1 10*3/uL (ref 19.0–186.0)
Retic Ct Pct: 1.1 % (ref 0.4–3.1)

## 2021-04-07 LAB — PHOSPHORUS: Phosphorus: 3.9 mg/dL (ref 2.5–4.6)

## 2021-04-07 LAB — MAGNESIUM: Magnesium: 1.8 mg/dL (ref 1.7–2.4)

## 2021-04-07 MED ORDER — GADOBUTROL 1 MMOL/ML IV SOLN
7.0000 mL | Freq: Once | INTRAVENOUS | Status: AC | PRN
  Administered 2021-04-07: 7 mL via INTRAVENOUS

## 2021-04-07 MED ORDER — METOCLOPRAMIDE HCL 5 MG/ML IJ SOLN
10.0000 mg | Freq: Once | INTRAMUSCULAR | Status: AC
  Administered 2021-04-07: 10 mg via INTRAVENOUS
  Filled 2021-04-07 (×2): qty 2

## 2021-04-07 MED ORDER — POTASSIUM CHLORIDE CRYS ER 20 MEQ PO TBCR
40.0000 meq | EXTENDED_RELEASE_TABLET | Freq: Once | ORAL | Status: AC
  Administered 2021-04-07: 40 meq via ORAL
  Filled 2021-04-07: qty 2

## 2021-04-07 NOTE — Progress Notes (Signed)
    Progress Note   Subjective  Chief Complaint: Abdominal pain, nausea, vomiting, partial SBO  Patient continues to be better with 3 diarrheal movements yesterday and a lot of bowel sounds and flatulence.  She has been tolerating some full liquids including yogurt and grits this morning.  Tells me that when they came with the contrast yesterday she did not think she could get all 3 bottles in so she declined.  This morning though patient tells me she really wants to do everything that she can to figure out what is going on and will have the MRI if we need to do it now.  Or at least try to get through the contrast.   Objective   Vital signs in last 24 hours: Temp:  [97.8 F (36.6 C)-98.6 F (37 C)] 97.8 F (36.6 C) (04/27 0624) Pulse Rate:  [67-74] 72 (04/27 0624) Resp:  [15-18] 18 (04/27 0624) BP: (104-117)/(65-73) 117/65 (04/27 0624) SpO2:  [97 %-100 %] 97 % (04/27 0624) Last BM Date: 04/06/21 General:    white female in NAD Heart:  Regular rate and rhythm; no murmurs Lungs: Respirations even and unlabored, lungs CTA bilaterally Abdomen:  Soft, nontender and nondistended. Normal bowel sounds. Psych:  Cooperative. Normal mood and affect.  Intake/Output from previous day: 04/26 0701 - 04/27 0700 In: 1188 [P.O.:480; I.V.:708] Out: -    Lab Results: Recent Labs    04/06/21 0341 04/07/21 0321  WBC 5.1 5.4  HGB 9.8* 9.6*  HCT 31.4* 30.6*  PLT 246 238   BMET Recent Labs    04/06/21 0341 04/07/21 0321  NA 142 142  K 3.5 3.4*  CL 106 109  CO2 30 24  GLUCOSE 87 93  BUN 11 9  CREATININE 0.70 0.69  CALCIUM 9.0 8.9   LFT Recent Labs    04/07/21 0321  PROT 6.2*  ALBUMIN 3.6  AST 13*  ALT 15  ALKPHOS 56  BILITOT 0.4     Assessment / Plan:   Assessment: 1.  Intractable nausea and vomiting with abdominal pain related to partial small bowel obstruction: Thought related to NSAID strictures, improving 2.  Possible duodenal ulcer  Plan: 1.  Continue full liquid  diet today. 2.  Ordered IV Reglan 10 mg to be given 20 minutes before starting the contrast for MRI today.  Patient had initially declined this but agrees to at least try drinking the contrast today.  MRI is scheduled for a little bit afternoon per radiology. 3.  Continue PPI twice daily x8 weeks given possible duodenal ulcer on imaging. 4.  Please await further recommendations from Dr. Tarri Glenn later today  Thank you for your kind consultation, we will continue to follow   LOS: 2 days   Levin Erp  04/07/2021, 9:54 AM

## 2021-04-07 NOTE — Progress Notes (Signed)
PROGRESS NOTE    Carolyn Chaney  IWO:[id number] DOB: Mar 16, 1961 DOA: 04/03/2021 PCP: [name], MD    Brief Narrative:  HPI from Dr Wyline Copas 60 y.o. female, with PMH of iron deficiency anemia secondary to chronic GI bleeding, atrial tachycardia, who presented to the ER on 04/03/2021 secondary to abdominal pain and nausea and vomiting. Patient states for the past 3 days she has had progressive worsening of abdominal pain, nausea, and vomiting. She has had poor p.o. intake.  Has not had a bowel movement since starting.  Vomit is nonbloody.  Nothing seems to improve the pain and this she presented to outside facility for further evaluation  Assessment & Plan:   Active Problems:   Iron deficiency anemia   SVT (supraventricular tachycardia)-probable   Chronic GI bleeding   Small bowel obstruction (HCC)   Abdominal pain   Intractable nausea and vomiting   SBO (small bowel obstruction) (HCC)   Stricture of intestine (HCC)    Intractable nausea and vomiting with abdominal pain likely related to partial SBO Likely secondary to partial bowel obstruction and inflammation CT of abdomen and pelvis on admission demonstrated partial obstruction and mucosal thickening and stricturing of the terminal ileum with possible duodenal ulcer on CT Pt had recent colon and egd with biopsy results which were neg GI consulted, rec MRE,currently pending  Possible duodenal ulcer CT on admission showed possible duodenal ulcer.  History of intermittent GI bleeding Cont protonix. Have stopped prophylactic anticoagulant GI consulted  Terminal ileum strictures/inflammation Possibly NSAID associated Advised to avoid NSAID Recent biopsy was negative  Normocytic anemia Baseline hemoglobin around 12 Anemia panel showed iron 29, sats 8, ferritin 7, folate 10.7 Daily CBC  History of atrial tachycardia, arrhythmia Continue continue on beta blocker. Currently on IV formulation until patient can reliably tolerate  PO  Depression Continue Zoloft   DVT prophylaxis: SCD's Code Status: Full Family Communication: No family at bedside  Status is: Inpatient  Inpatient because: Ongoing diagnostic testing needed not appropriate for outpatient work up and Inpatient level of care appropriate due to severity of illness  Dispo: The patient is from: Home              Anticipated d/c is to: Home              Patient currently is not medically stable to d/c.   Difficult to place patient No       Consultants:   [facility]  Procedures:   None  Antimicrobials: Anti-infectives (From admission, onward)   Start     Dose/Rate Route Frequency Ordered Stop   04/04/21 1030  amoxicillin-clavulanate (AUGMENTIN) 875-125 MG per tablet 1 tablet        1 tablet Oral Every 12 hours 04/04/21 0933 04/06/21 2359      Subjective: Denies any further nausea or vomiting, denies any worsening abdominal pain.  Reports bowel movements.  Was able to tolerate full liquid diet today.  Patient declined contrast last night due to prior history of cramping after taking contrast.  Advised patient to adhere given the need for the procedure.  GI ordered Reglan prior to drinking contrast  Objective: Vitals:   04/06/21 1431 04/06/21 2044 04/07/21 0624 04/07/21 1305  BP: 106/73 104/68 117/65 128/84  Pulse: 74 67 72 62  Resp:  15 18 18   Temp: 98.2 F (36.8 C) 98.6 F (37 C) 97.8 F (36.6 C)   TempSrc:  Oral Oral   SpO2: 100% 100% 97% 100%  Weight:  Height:        Intake/Output Summary (Last 24 hours) at 04/07/2021 1714 Last data filed at 04/07/2021 0600 Gross per 24 hour  Intake 1188.02 ml  Output --  Net 1188.02 ml   Filed Weights   04/03/21 2242  Weight: 65.8 kg    Examination:  General: NAD   Cardiovascular: S1, S2 present  Respiratory: CTAB  Abdomen: Soft, nontender, nondistended, bowel sounds present  Musculoskeletal: No bilateral pedal edema noted  Skin: Normal  Psychiatry: Normal  mood    Data Reviewed: I have personally reviewed following labs and imaging studies  CBC: Recent Labs  Lab 04/03/21 2301 04/04/21 0821 04/06/21 0341 04/07/21 0321  WBC 12.0* 6.5 5.1 5.4  NEUTROABS 11.1*  --   --   --   HGB 12.1 10.5* 9.8* 9.6*  HCT 37.7 33.2* 31.4* 30.6*  MCV 89.8 91.7 94.0 91.9  PLT 336 254 246 035   Basic Metabolic Panel: Recent Labs  Lab 04/03/21 2301 04/04/21 0821 04/06/21 0341 04/07/21 0321  NA 136 142 142 142  K 3.9 4.1 3.5 3.4*  CL 101 107 106 109  CO2 23 26 30 24   GLUCOSE 141* 162* 87 93  BUN 12 11 11 9   CREATININE 0.76 0.78 0.70 0.69  CALCIUM 9.4 9.6 9.0 8.9  MG  --  2.2  --  1.8  PHOS  --  3.6  --  3.9   GFR: Estimated Creatinine Clearance: 70 mL/min (by C-G formula based on SCr of 0.69 mg/dL). Liver Function Tests: Recent Labs  Lab 04/03/21 2301 04/04/21 0821 04/06/21 0341 04/07/21 0321  AST 21 17 14* 13*  ALT 21 18 16 15   ALKPHOS 77 70 56 56  BILITOT 0.4 0.3 0.3 0.4  PROT 7.9 7.0 6.5 6.2*  ALBUMIN 4.5 3.9 3.8 3.6   Recent Labs  Lab 04/03/21 2301  LIPASE 43   No results for input(s): AMMONIA in the last 168 hours. Coagulation Profile: No results for input(s): INR, PROTIME in the last 168 hours. Cardiac Enzymes: No results for input(s): CKTOTAL, CKMB, CKMBINDEX, TROPONINI in the last 168 hours. BNP (last 3 results) No results for input(s): PROBNP in the last 8760 hours. HbA1C: No results for input(s): HGBA1C in the last 72 hours. CBG: No results for input(s): GLUCAP in the last 168 hours. Lipid Profile: No results for input(s): CHOL, HDL, LDLCALC, TRIG, CHOLHDL, LDLDIRECT in the last 72 hours. Thyroid Function Tests: No results for input(s): TSH, T4TOTAL, FREET4, T3FREE, THYROIDAB in the last 72 hours. Anemia Panel: Recent Labs    04/07/21 0321  VITAMINB12 1,653*  FOLATE 10.7  FERRITIN 7*  TIBC 385  IRON 29  RETICCTPCT 1.1   Sepsis Labs: No results for input(s): PROCALCITON, LATICACIDVEN in the last 168  hours.  Recent Results (from the past 240 hour(s))  SARS CORONAVIRUS 2 (TAT 6-24 HRS) Nasopharyngeal Nasopharyngeal Swab     Status: None   Collection Time: 04/04/21  1:40 AM   Specimen: Nasopharyngeal Swab  Result Value Ref Range Status   SARS Coronavirus 2 NEGATIVE NEGATIVE Final    Comment: (NOTE) SARS-CoV-2 target nucleic acids are NOT DETECTED.  The SARS-CoV-2 RNA is generally detectable in upper and lower respiratory specimens during the acute phase of infection. Negative results do not preclude SARS-CoV-2 infection, do not rule out co-infections with other pathogens, and should not be used as the sole basis for treatment or other patient management decisions. Negative results must be combined with clinical observations, patient history, and epidemiological  information. The expected result is Negative.  Fact Sheet for Patients: SugarRoll.be  Fact Sheet for Healthcare Providers: https://www.woods-mathews.com/  This test is not yet approved or cleared by the [place] FDA and  has been authorized for detection and/or diagnosis of SARS-CoV-2 by FDA under an Emergency Use Authorization (EUA). This EUA will remain  in effect (meaning this test can be used) for the duration of the COVID-19 declaration under Se ction 564(b)(1) of the Act, 21 U.S.C. section 360bbb-3(b)(1), unless the authorization is terminated or revoked sooner.  Performed at [facility], [place] [address], [place] [employer], [place] [address]      Radiology Studies: No results found.  Scheduled Meds: . metoprolol tartrate  5 mg Intravenous Q6H  . pantoprazole (PROTONIX) IV  40 mg Intravenous Q12H  . sertraline  150 mg Oral Daily   Continuous Infusions: . sodium chloride 60 mL/hr at 04/06/21 2311     LOS: 2 days   Alma Friendly, MD [facility] Pager On Amion  If 7PM-7AM, please contact night-coverage 04/07/2021, 5:14 PM

## 2021-04-08 ENCOUNTER — Telehealth: Payer: Self-pay | Admitting: *Deleted

## 2021-04-08 ENCOUNTER — Telehealth: Payer: Self-pay | Admitting: Gastroenterology

## 2021-04-08 ENCOUNTER — Telehealth: Payer: Self-pay

## 2021-04-08 ENCOUNTER — Other Ambulatory Visit: Payer: Self-pay

## 2021-04-08 MED ORDER — PANTOPRAZOLE SODIUM 40 MG PO TBEC: 40.0000 mg | DELAYED_RELEASE_TABLET | Freq: Two times a day (BID) | ORAL | 0 refills | Status: DC

## 2021-04-08 MED ORDER — BUDESONIDE 3 MG PO CPEP: 9.0000 mg | ORAL_CAPSULE | Freq: Every day | ORAL | 3 refills | Status: DC

## 2021-04-08 NOTE — Plan of Care (Signed)
  Problem: Education: Goal: Knowledge of General Education information will improve Description: Including pain rating scale, medication(s)/side effects and non-pharmacologic comfort measures Outcome: Progressing   Problem: Health Behavior/Discharge Planning: Goal: Ability to manage health-related needs will improve Outcome: Progressing   Problem: Clinical Measurements: Goal: Ability to maintain clinical measurements within normal limits will improve Outcome: Progressing Goal: Cardiovascular complication will be avoided Outcome: Progressing   Problem: Activity: Goal: Risk for activity intolerance will decrease Outcome: Progressing   Problem: Nutrition: Goal: Adequate nutrition will be maintained Outcome: Progressing   Problem: Elimination: Goal: Will not experience complications related to bowel motility Outcome: Progressing   Problem: Pain Managment: Goal: General experience of comfort will improve Outcome: Progressing

## 2021-04-08 NOTE — Telephone Encounter (Signed)
Per scheduling message [facility] - called and lvm of upcoming appointment - view mychart

## 2021-04-08 NOTE — Telephone Encounter (Signed)
Carolyn Chaney, unfortunately Dr. Bryan Lemma does not have any sooner availability. If patient would like to be seen sooner please schedule with an APP. Thanks

## 2021-04-08 NOTE — Plan of Care (Signed)
Patient discharged, all care plans met ° °

## 2021-04-08 NOTE — Telephone Encounter (Signed)
Spoke with patient in regards to recommendations. She has been scheduled to see Dr. Bryan Lemma on 04/20/21 at 10 AM. Patient had no other concerns at the end of the call.

## 2021-04-08 NOTE — Telephone Encounter (Signed)
Prescription sent to pts pharmacy, she is aware.

## 2021-04-08 NOTE — Discharge Summary (Signed)
Discharge Summary  [name]:[id number] DOB: Jan 01, 1961  PCP: [name], MD  Admit date: 04/03/2021 Discharge date: 04/08/2021  Time spent: 40 mins  Recommendations for Outpatient Follow-up:  1. Follow-up with PCP in 1 week 2. Follow-up with GI as scheduled  Discharge Diagnoses:  Active Hospital Problems   Diagnosis Date Noted  . SBO (small bowel obstruction) ([place]) 04/05/2021  . 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 Small bowel obstruction ([facility]) 04/04/2021  . Abdominal pain 04/04/2021  . Intractable nausea and vomiting 04/04/2021  . Chronic GI bleeding 01/27/2016  . SVT (supraventricular tachycardia)-probable 08/09/2012  . Iron deficiency anemia 11/07/2011    Resolved Hospital Problems  No resolved problems to display.    Discharge Condition: Stable  Diet recommendation: Full liquid diet, advance as tolerated to low fiber diet  Vitals:   04/07/21 2142 04/08/21 0523  BP: 125/74 112/66  Pulse: 63 69  Resp: 16 15  Temp: 97.9 F (36.6 C) (!) 97.5 F (36.4 C)  SpO2: 98% 99%    History of present illness:  60 Carolyn Chaney, with PMH ofiron deficiency anemia secondary to chronic GI bleeding, atrial tachycardia,who presented to the ER on 04/03/2021 secondary to abdominal pain and nausea and vomiting. Patient states for the past 3 days she has had progressive worsening of abdominal pain, nausea, and vomiting. She has had poor p.o. intake. Has not had a bowel movement since starting. Vomit is nonbloody. Nothing seems to improve the pain and this she presented to outside facility for further evaluation.  GI consulted and patient was admitted for further management.    Today, patient stated she wants to go home, refused any lab draw.  Reports she feels much better except for some lower abdominal cramping which she states is from drinking contrast as this is happened before in the past.  Patient denies any nausea/vomiting, fever/chills, worsening abdominal pain, chest  pain, shortness of breath.  Patient advised to follow-up with GI for further management as an outpatient.    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[facility])   Intractable nausea and vomiting with abdominal pain likely related to partial SBO Likely secondary to partial bowel obstruction and inflammation CT of abdomen and pelvis on admission demonstrated partial obstruction and mucosal thickening and stricturing of the terminal ileum with possible duodenal ulcer on CT Pt had recent colon and egd with biopsy results which were neg GI consulted, s/p MRE abdomen/pelvis, which was suggestive of active Crohn's disease, no findings for an abscess or fistula Outpatient follow-up with GI for further management  Terminal ileum strictures/inflammation Possibly NSAID associated Vs Crohn's disease Advised to avoid NSAID Recent biopsy was negative Follow-up with GI  Possible duodenal ulcer CT on admission showed possible duodenal ulcer. History of intermittent GI bleeding GI consulted, recommend PPI twice daily for 8 weeks Discharge on Protonix 40 twice daily Follow-up with GI  Normocytic anemia Baseline hemoglobin around 12 Anemia panel showed iron 29, sats 8, ferritin 7, folate 10.7 Follow-up with PCP/GI  History of atrial tachycardia, arrhythmia Continue home beta blocker  Depression Continue Zoloft      Estimated body mass index is 23.4 kg/m as calculated from the following:   Height as of this encounter: 5' 6"  (1.676 m).   Weight  as of this encounter: 65.8 kg.    Procedures:  None  Consultations:  GI  Discharge Exam: BP 112/66 (BP Location: Left Arm)   Pulse 69   Temp (!) 97.5 F (36.4 C)   Resp 15   Ht 5' 6"  (1.676 m)   Wt  65.8 kg   SpO2 99%   BMI 23.40 kg/m   General: NAD Cardiovascular: S1, S2 present Abdomen: Soft, mildly tender, nondistended, bowel sounds present Respiratory: CTA B     Discharge Instructions You were cared for by a hospitalist during your hospital stay. If you have any questions about your discharge medications or the care you received while you were in the hospital after you are discharged, you can call the unit and asked to speak with the hospitalist on call if the hospitalist that took care of you is not available. Once you are discharged, your primary care physician will handle any further medical issues. Please note that NO REFILLS for any discharge medications will be authorized once you are discharged, as it is imperative that you return to your primary care physician (or establish a relationship with a primary care physician if you do not have one) for your aftercare needs so that they can reassess your need for medications and monitor your lab values.  Discharge Instructions    Diet - low sodium heart healthy   Complete by: As directed    Increase activity slowly   Complete by: As directed      Allergies as of 04/08/2021      Reactions   Nsaids Other (See Comments)   Bleeding Bleeding      Medication List    STOP taking these medications   sucralfate 1 GM/10ML suspension Commonly known as: CARAFATE     TAKE these medications   acetaminophen 500 MG tablet Commonly known as: TYLENOL Take 500 mg by mouth every 6 (six) hours as needed for mild pain.   ALPRAZolam 0.5 MG tablet Commonly known as: XANAX Take 0.25 mg by mouth daily as needed for anxiety.   ascorbic acid 500 MG tablet Commonly known as: VITAMIN C Take 500 mg by mouth daily as needed.   cholecalciferol 1000 units tablet Commonly known as: VITAMIN D Take 1,000 Units by mouth daily as needed.   metoprolol succinate 25 MG 24 hr tablet Commonly known as: TOPROL-XL Take 1 tablet (25 mg total) by  mouth daily. What changed: additional instructions   pantoprazole 40 MG tablet Commonly known as: Protonix Take 1 tablet (40 mg total) by mouth 2 (two) times daily.   sertraline 100 MG tablet Commonly known as: ZOLOFT Take 150 mg by mouth daily.   vitamin B-12 1000 MCG tablet Commonly known as: CYANOCOBALAMIN Take 1,000 mcg by mouth daily as needed.      Allergies  Allergen Reactions  . Nsaids Other (See Comments)    Bleeding Bleeding    Follow-up Information    [name], MD. Schedule an appointment as soon as possible for a visit in 1 week(s).   Specialty: Internal Medicine Why: To follow up Contact information: [address] [place] [address] [phone]        Deboraha Sprang, MD .   Specialty: Cardiology Contact information: [phone] [address] [place] [address] [phone]        Thornton Park, MD Follow up.   Specialty: Gastroenterology Why: They will call you for a follow up Contact information: [place]  [place] [address] [phone]                The results of significant diagnostics from this hospitalization (including imaging, microbiology, ancillary and laboratory) are listed below for reference.    Significant Diagnostic Studies: CT ABDOMEN PELVIS W CONTRAST  Result Date: 04/04/2021 CLINICAL DATA:  Abdominal pain, distention, and constipation for 18 hours. Previous history of bowel obstructions. EXAM: CT ABDOMEN AND PELVIS WITH CONTRAST TECHNIQUE: Multidetector CT imaging of the abdomen and pelvis was performed using the standard protocol following bolus administration of intravenous contrast. CONTRAST:  170m OMNIPAQUE IOHEXOL 300 MG/ML  SOLN COMPARISON:  09/23/2011 FINDINGS: Lower chest: Lung bases are clear. Hepatobiliary: No focal liver abnormality is seen. No gallstones, gallbladder wall thickening, or biliary dilatation. Pancreas: Unremarkable. No pancreatic ductal dilatation  or surrounding inflammatory changes. Spleen: Normal in size without focal abnormality. Adrenals/Urinary Tract: Adrenal glands are unremarkable. Kidneys are normal, without renal calculi, focal lesion, or hydronephrosis. Bladder is unremarkable. Stomach/Bowel: The stomach is decompressed, limiting evaluation. There appears to be some mucosal thickening with crater ring of the pyloric region suggesting a duodenal ulcer. Fluid-filled mildly dilated distal small bowel with mucosal thickening and stricture in the terminal ileum. Change in caliber suggest partial obstruction at this location due to a regional enteritis. Appearance is likely to represent Crohn disease. The colon is decompressed. No definite colonic wall thickening. Vascular/Lymphatic: No significant vascular findings are present. No enlarged abdominal or pelvic lymph nodes. Reproductive: Uterus and bilateral adnexa are unremarkable. Other: Small amount of free fluid in the abdomen and pelvis. No free air. Abdominal wall musculature appears intact. Musculoskeletal: No acute or significant osseous findings. IMPRESSION: 1. Fluid-filled mildly dilated distal small bowel with mucosal thickening and stricturing in the terminal ileum. Change in caliber suggest partial obstruction at this location due to a regional enteritis. Appearance is likely to represent Crohn disease. 2. Small amount of free fluid in the abdomen and pelvis, likely reactive. 3. Possible duodenal ulcer. Electronically Signed   By: WLucienne CapersM.D.   On: 04/04/2021 00:32   MR ENTERO ABDOMEN W WO CONTRAST  Result Date: 04/07/2021 CLINICAL DATA:  Abdominal pain distension. History of prior bowel obstructions. EXAM: MR ABDOMEN AND PELVIS WITHOUT AND WITH CONTRAST (MR ENTEROGRAPHY) TECHNIQUE: Multiplanar, multisequence MRI of the abdomen and pelvis was performed both before and during bolus administration of intravenous contrast. Negative oral contrast VoLumen was given. CONTRAST:  771m GADAVIST GADOBUTROL 1 MMOL/ML IV SOLN COMPARISON:  CT scan 04/04/2021 FINDINGS: COMBINED FINDINGS FOR BOTH MR ABDOMEN AND PELVIS Lower chest: The lung bases are unremarkable. Hepatobiliary: No hepatic lesions or intrahepatic biliary dilatation. The gallbladder is unremarkable. Normal caliber and course of the common bile duct. Pancreas:  No mass, inflammation or ductal dilatation. Spleen:  Normal size.  No focal lesions. Adrenals/Urinary Tract: The adrenal glands and kidneys are unremarkable. The bladder is unremarkable. Stomach/Bowel: The stomach, duodenum and proximal and mid small bowel are unremarkable. Distally there are slightly dilated fluid-filled loops of ileum similar to the CT scan. Maximum diameter is 2.4 cm. There is a transition to somewhat narrowed distal and terminal ileum with significant wall thickening and enhancement. This certainly has the appearance of active Crohn's disease. Although this could be a functional obstruction the narrowing appears fairly persistent between the CT and the MRI and I suspect there is some chronic underlying scarring change with fibrosis and at least some component of a partial mechanical obstruction. I do not see any involvement of the colon.  Vascular/Lymphatic: The aorta and branch vessels are patent. The major venous structures are patent. No mesenteric or retroperitoneal mass or adenopathy. Reproductive: The uterus and ovaries are unremarkable. Other: No significant free abdominal/pelvic fluid collections. No findings suspicious for an abscess. Musculoskeletal: No significant bony findings. IMPRESSION: 1. Persistent wall thickening and enhancement of the distal and terminal ileum suggesting active Crohn's disease. Suspect strictured narrowing likely from chronic inflammation. This plus active inflammation is probably causing a combination of a mild partial functional and mechanical obstruction. 2. No findings for an abscess or fistula. Electronically Signed   By:  Marijo Sanes M.D.   On: 04/07/2021 18:24   MR ENTERO PELVIS W WO CONTRAST  Result Date: 04/07/2021 CLINICAL DATA:  Abdominal pain distension. History of prior bowel obstructions. EXAM: MR ABDOMEN AND PELVIS WITHOUT AND WITH CONTRAST (MR ENTEROGRAPHY) TECHNIQUE: Multiplanar, multisequence MRI of the abdomen and pelvis was performed both before and during bolus administration of intravenous contrast. Negative oral contrast VoLumen was given. CONTRAST:  53m GADAVIST GADOBUTROL 1 MMOL/ML IV SOLN COMPARISON:  CT scan 04/04/2021 FINDINGS: COMBINED FINDINGS FOR BOTH MR ABDOMEN AND PELVIS Lower chest: The lung bases are unremarkable. Hepatobiliary: No hepatic lesions or intrahepatic biliary dilatation. The gallbladder is unremarkable. Normal caliber and course of the common bile duct. Pancreas:  No mass, inflammation or ductal dilatation. Spleen:  Normal size.  No focal lesions. Adrenals/Urinary Tract: The adrenal glands and kidneys are unremarkable. The bladder is unremarkable. Stomach/Bowel: The stomach, duodenum and proximal and mid small bowel are unremarkable. Distally there are slightly dilated fluid-filled loops of ileum similar to the CT scan. Maximum diameter is 2.4 cm. There is a transition to somewhat narrowed distal and terminal ileum with significant wall thickening and enhancement. This certainly has the appearance of active Crohn's disease. Although this could be a functional obstruction the narrowing appears fairly persistent between the CT and the MRI and I suspect there is some chronic underlying scarring change with fibrosis and at least some component of a partial mechanical obstruction. I do not see any involvement of the colon. Vascular/Lymphatic: The aorta and branch vessels are patent. The major venous structures are patent. No mesenteric or retroperitoneal mass or adenopathy. Reproductive: The uterus and ovaries are unremarkable. Other: No significant free abdominal/pelvic fluid collections. No  findings suspicious for an abscess. Musculoskeletal: No significant bony findings. IMPRESSION: 1. Persistent wall thickening and enhancement of the distal and terminal ileum suggesting active Crohn's disease. Suspect strictured narrowing likely from chronic inflammation. This plus active inflammation is probably causing a combination of a mild partial functional and mechanical obstruction. 2. No findings for an abscess or fistula. Electronically Signed   By: PMarijo SanesM.D.   On: 04/07/2021 18:24    Microbiology: Recent Results (from the past 240 hour(s))  SARS CORONAVIRUS 2 (TAT 6-24 HRS) Nasopharyngeal Nasopharyngeal Swab     Status: None   Collection Time: 04/04/21  1:40 AM   Specimen: Nasopharyngeal Swab  Result Value Ref Range Status   SARS Coronavirus 2 NEGATIVE NEGATIVE Final    Comment: (NOTE) SARS-CoV-2 target nucleic acids are NOT DETECTED.  The SARS-CoV-2 RNA is generally detectable in upper and lower respiratory specimens during the acute phase of infection. Negative results do not preclude SARS-CoV-2 infection, do not rule out co-infections with other pathogens, and should not be used as the sole basis for treatment or other patient management decisions. Negative results must be combined with clinical observations, patient history, and epidemiological information. The expected result is Negative.  Fact Sheet for Patients: SugarRoll.be  Fact Sheet for Healthcare Providers: https://www.woods-mathews.com/  This test is not yet approved or cleared by the [place] FDA and  has been authorized for detection and/or diagnosis of SARS-CoV-2 by FDA under an Emergency Use Authorization (EUA). This EUA will remain  in effect (meaning this test can be used) for the duration of the COVID-19 declaration under Se ction 564(b)(1) of the Act, 21 U.S.C. section 360bbb-3(b)(1), unless the authorization is terminated or revoked  sooner.  Performed at [facility], [place] [address], [place], [place] [address]      Labs: Basic Metabolic Panel: Recent Labs  Lab 04/03/21 2301 04/04/21 0821 04/06/21 0341 04/07/21 0321  NA 136 142 142 142  K 3.9 4.1 3.5 3.4*  CL 101 107 106 109  CO2 23 26 30 24   GLUCOSE 141* 162* 87 93  BUN 12 11 11 9   CREATININE 0.76 0.78 0.70 0.69  CALCIUM 9.4 9.6 9.0 8.9  MG  --  2.2  --  1.8  PHOS  --  3.6  --  3.9   Liver Function Tests: Recent Labs  Lab 04/03/21 2301 04/04/21 0821 04/06/21 0341 04/07/21 0321  AST 21 17 14* 13*  ALT 21 18 16 15   ALKPHOS Carolyn 70 56 56  BILITOT 0.4 0.3 0.3 0.4  PROT 7.9 7.0 6.5 6.2*  ALBUMIN 4.5 3.9 3.8 3.6   Recent Labs  Lab 04/03/21 2301  LIPASE 43   No results for input(s): AMMONIA in the last 168 hours. CBC: Recent Labs  Lab 04/03/21 2301 04/04/21 0821 04/06/21 0341 04/07/21 0321  WBC 12.0* 6.5 5.1 5.4  NEUTROABS 11.1*  --   --   --   HGB 12.1 10.5* 9.8* 9.6*  HCT 37.7 33.2* 31.4* 30.6*  MCV 89.8 91.7 94.0 91.9  PLT 336 254 246 238   Cardiac Enzymes: No results for input(s): CKTOTAL, CKMB, CKMBINDEX, TROPONINI in the last 168 hours. BNP: BNP (last 3 results) No results for input(s): BNP in the last 8760 hours.  ProBNP (last 3 results) No results for input(s): PROBNP in the last 8760 hours.  CBG: No results for input(s): GLUCAP in the last 168 hours.     Signed:  Alma Friendly, MD [facility] 04/08/2021, 10:37 AM

## 2021-04-08 NOTE — Telephone Encounter (Signed)
Returned call to pt regarding Iron infusion request. After MD reviewed pt recent Iron studies, discussed with pt to expect a call from scheduling for IV Iron infusionappointment. Pt verbalized understanding and confirmed understanding. Message to scheduling.

## 2021-04-08 NOTE — Telephone Encounter (Signed)
Inbound call from patient wanting to scheduled ED follow-up with Dr. Bryan Lemma.  Informed patient next availability will be in June.  Requested to speak with a nurse to see if she can be seen sooner.  Please advise.

## 2021-04-08 NOTE — Telephone Encounter (Signed)
-----   Message from Thornton Park, MD sent at 04/08/2021  1:18 PM EDT ----- Vaughan Basta, this patient of Dr. Vivia Ewing was discharged today before I saw her. I am surprised that she went home given my assessment of her yesterday. Please call her to let her know that I've reviewed her MRI and feel that I trial of steroids may make a big difference given the inflammation in her small intestines. Please start budesonide 9 mg daily. I would like for her to have a script to cover her for 90 days, although her treatment may be adjusted when she follows up with Dr. Cathleen Corti. Thank you.

## 2021-04-08 NOTE — Telephone Encounter (Signed)
Spoke with patient, she is calling to follow up with Carolyn Chaney after her ED visit for a small bowel obstruction. No availability until June. Patient states that she was told that Carolyn Chaney is aware of her case and what is going on and she needs to have this taken care of ASAP. She states that she does not want to wait around until June and she has to be an advocate for herself and not let the ball drop. She states that from her understanding all of this needs to be taken care of ASAP, she states that if she would have known that she would have to wait until June for a visit she wouldn't have left the hospital. She states that she really likes Carolyn Chaney but if she needs to be seen elsewhere she will do that. Based on last encounter with Anderson Malta this morning, would wait for recommendations from Carolyn Chaney based on MRI results. Please advise, thank you.

## 2021-04-08 NOTE — Progress Notes (Signed)
Progress Note   Subjective  Chief Complaint: Abdominal pain, nausea, vomiting, partial SBO  Today, I walk in the room and patient is dressed and tells me she wants to leave.  Apparently the hospitalist already told her that it is up to [place].  She tells me she has continued to tolerate a full liquid diet with no further nausea or vomiting and has had multiple liquid stools throughout the evening.  Does continue with some mild lower abdominal discomfort bilaterally, but tells me this is mostly after drinking the contrast for the MRI yesterday which "happen the last time I did that".  Patient wants to go home.   Objective   Vital signs in last 24 hours: Temp:  [97.5 F (36.4 C)-97.9 F (36.6 C)] 97.5 F (36.4 C) (04/28 0523) Pulse Rate:  [62-69] 69 (04/28 0523) Resp:  [15-18] 15 (04/28 0523) BP: (112-128)/(66-84) 112/66 (04/28 0523) SpO2:  [98 %-100 %] 99 % (04/28 0523) Last BM Date: 04/07/21 General:    white female in NAD Heart:  Regular rate and rhythm; no murmurs Lungs: Respirations even and unlabored, lungs CTA bilaterally Abdomen:  Soft, nontender and nondistended. Normal bowel sounds. Psych:  Cooperative. Normal mood and affect.  Intake/Output from previous day: 04/27 0701 - 04/28 0700 In: 1108.6 [P.O.:600; I.V.:508.6] Out: -   Lab Results: Recent Labs    04/06/21 0341 04/07/21 0321  WBC 5.1 5.4  HGB 9.8* 9.6*  HCT 31.4* 30.6*  PLT 246 238   BMET Recent Labs    04/06/21 0341 04/07/21 0321  NA 142 142  K 3.5 3.4*  CL 106 109  CO2 30 24  GLUCOSE 87 93  BUN 11 9  CREATININE 0.70 0.69  CALCIUM 9.0 8.9   LFT Recent Labs    04/07/21 0321  PROT 6.2*  ALBUMIN 3.6  AST 13*  ALT 15  ALKPHOS 56  BILITOT 0.4   Studies/Results: MR ENTERO ABDOMEN W WO CONTRAST  Result Date: 04/07/2021 CLINICAL DATA:  Abdominal pain distension. History of prior bowel obstructions. EXAM: MR ABDOMEN AND PELVIS WITHOUT AND WITH CONTRAST (MR ENTEROGRAPHY) TECHNIQUE:  Multiplanar, multisequence MRI of the abdomen and pelvis was performed both before and during bolus administration of intravenous contrast. Negative oral contrast VoLumen was given. CONTRAST:  45m GADAVIST GADOBUTROL 1 MMOL/ML IV SOLN COMPARISON:  CT scan 04/04/2021 FINDINGS: COMBINED FINDINGS FOR BOTH MR ABDOMEN AND PELVIS Lower chest: The lung bases are unremarkable. Hepatobiliary: No hepatic lesions or intrahepatic biliary dilatation. The gallbladder is unremarkable. Normal caliber and course of the common bile duct. Pancreas:  No mass, inflammation or ductal dilatation. Spleen:  Normal size.  No focal lesions. Adrenals/Urinary Tract: The adrenal glands and kidneys are unremarkable. The bladder is unremarkable. Stomach/Bowel: The stomach, duodenum and proximal and mid small bowel are unremarkable. Distally there are slightly dilated fluid-filled loops of ileum similar to the CT scan. Maximum diameter is 2.4 cm. There is a transition to somewhat narrowed distal and terminal ileum with significant wall thickening and enhancement. This certainly has the appearance of active Crohn's disease. Although this could be a functional obstruction the narrowing appears fairly persistent between the CT and the MRI and I suspect there is some chronic underlying scarring change with fibrosis and at least some component of a partial mechanical obstruction. I do not see any involvement of the colon. Vascular/Lymphatic: The aorta and branch vessels are patent. The major venous structures are patent. No mesenteric or retroperitoneal mass or adenopathy. Reproductive: The uterus and  ovaries are unremarkable. Other: No significant free abdominal/pelvic fluid collections. No findings suspicious for an abscess. Musculoskeletal: No significant bony findings. IMPRESSION: 1. Persistent wall thickening and enhancement of the distal and terminal ileum suggesting active Crohn's disease. Suspect strictured narrowing likely from chronic  inflammation. This plus active inflammation is probably causing a combination of a mild partial functional and mechanical obstruction. 2. No findings for an abscess or fistula. Electronically Signed   By: Marijo Sanes M.D.   On: 04/07/2021 18:24   MR ENTERO PELVIS W WO CONTRAST  Result Date: 04/07/2021 CLINICAL DATA:  Abdominal pain distension. History of prior bowel obstructions. EXAM: MR ABDOMEN AND PELVIS WITHOUT AND WITH CONTRAST (MR ENTEROGRAPHY) TECHNIQUE: Multiplanar, multisequence MRI of the abdomen and pelvis was performed both before and during bolus administration of intravenous contrast. Negative oral contrast VoLumen was given. CONTRAST:  72m GADAVIST GADOBUTROL 1 MMOL/ML IV SOLN COMPARISON:  CT scan 04/04/2021 FINDINGS: COMBINED FINDINGS FOR BOTH MR ABDOMEN AND PELVIS Lower chest: The lung bases are unremarkable. Hepatobiliary: No hepatic lesions or intrahepatic biliary dilatation. The gallbladder is unremarkable. Normal caliber and course of the common bile duct. Pancreas:  No mass, inflammation or ductal dilatation. Spleen:  Normal size.  No focal lesions. Adrenals/Urinary Tract: The adrenal glands and kidneys are unremarkable. The bladder is unremarkable. Stomach/Bowel: The stomach, duodenum and proximal and mid small bowel are unremarkable. Distally there are slightly dilated fluid-filled loops of ileum similar to the CT scan. Maximum diameter is 2.4 cm. There is a transition to somewhat narrowed distal and terminal ileum with significant wall thickening and enhancement. This certainly has the appearance of active Crohn's disease. Although this could be a functional obstruction the narrowing appears fairly persistent between the CT and the MRI and I suspect there is some chronic underlying scarring change with fibrosis and at least some component of a partial mechanical obstruction. I do not see any involvement of the colon. Vascular/Lymphatic: The aorta and branch vessels are patent. The  major venous structures are patent. No mesenteric or retroperitoneal mass or adenopathy. Reproductive: The uterus and ovaries are unremarkable. Other: No significant free abdominal/pelvic fluid collections. No findings suspicious for an abscess. Musculoskeletal: No significant bony findings. IMPRESSION: 1. Persistent wall thickening and enhancement of the distal and terminal ileum suggesting active Crohn's disease. Suspect strictured narrowing likely from chronic inflammation. This plus active inflammation is probably causing a combination of a mild partial functional and mechanical obstruction. 2. No findings for an abscess or fistula. Electronically Signed   By: PMarijo SanesM.D.   On: 04/07/2021 18:24    Assessment / Plan:   Assessment: 1.  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 CBryan Lemma symptoms have all gotten better since hospitalization 2.  Possible duodenal ulcer  Plan: 1.  Discussed with patient that she should continue on a full liquid diet when she gets home until she feels better, then slowly add to low fiber 2.  We will send a note to Dr. CBryan Lemmato let him review recent MRI.  He can give further recommendations once he does so and hopefully follow-up with the patient in clinic to discuss. 3.  Would continue PPI twice daily x8 weeks given possible duodenal ulcer on imaging 4.  Okay with patient  discharge today.  Thank you for your kind consultation.   LOS: 3 days   Levin Erp  04/08/2021, 9:35 AM

## 2021-04-12 ENCOUNTER — Telehealth: Payer: Self-pay

## 2021-04-12 ENCOUNTER — Inpatient Hospital Stay: Payer: 59 | Attending: Hematology & Oncology

## 2021-04-12 ENCOUNTER — Other Ambulatory Visit: Payer: Self-pay | Admitting: Family

## 2021-04-12 ENCOUNTER — Other Ambulatory Visit: Payer: Self-pay

## 2021-04-12 VITALS — BP 106/57 | HR 66 | Temp 98.3°F | Resp 18

## 2021-04-12 DIAGNOSIS — K922 Gastrointestinal hemorrhage, unspecified: Secondary | ICD-10-CM

## 2021-04-12 DIAGNOSIS — D500 Iron deficiency anemia secondary to blood loss (chronic): Secondary | ICD-10-CM | POA: Insufficient documentation

## 2021-04-12 MED ORDER — METHYLPREDNISOLONE SODIUM SUCC 125 MG IJ SOLR
INTRAMUSCULAR | Status: AC
  Filled 2021-04-12: qty 2

## 2021-04-12 MED ORDER — SODIUM CHLORIDE 0.9 % IV SOLN
Freq: Once | INTRAVENOUS | Status: AC
Start: 2021-04-12 — End: 2021-04-12
  Filled 2021-04-12: qty 250

## 2021-04-12 MED ORDER — FAMOTIDINE IN NACL 20-0.9 MG/50ML-% IV SOLN: 20.0000 mg | Freq: Once | INTRAVENOUS | Status: DC

## 2021-04-12 MED ORDER — SODIUM CHLORIDE 0.9 % IV SOLN
200.0000 mg | Freq: Once | INTRAVENOUS | Status: DC
  Filled 2021-04-12: qty 10

## 2021-04-12 MED ORDER — DIPHENHYDRAMINE HCL 25 MG PO CAPS
25.0000 mg | ORAL_CAPSULE | Freq: Once | ORAL | Status: AC
  Administered 2021-04-12: 25 mg via ORAL

## 2021-04-12 MED ORDER — SODIUM CHLORIDE 0.9 % IV SOLN
400.0000 mg | Freq: Once | INTRAVENOUS | Status: AC
  Administered 2021-04-12: 400 mg via INTRAVENOUS
  Filled 2021-04-12: qty 20

## 2021-04-12 MED ORDER — DIPHENHYDRAMINE HCL 25 MG PO CAPS
ORAL_CAPSULE | ORAL | Status: AC
  Filled 2021-04-12: qty 1

## 2021-04-12 MED ORDER — DIPHENHYDRAMINE HCL 50 MG/ML IJ SOLN
INTRAMUSCULAR | Status: AC
  Filled 2021-04-12: qty 1

## 2021-04-12 MED ORDER — SODIUM CHLORIDE 0.9 % IV SOLN
Freq: Once | INTRAVENOUS | Status: AC
  Filled 2021-04-12: qty 250

## 2021-04-12 MED ORDER — METHYLPREDNISOLONE SODIUM SUCC 125 MG IJ SOLR: 60.0000 mg | Freq: Once | INTRAMUSCULAR | Status: DC

## 2021-04-12 MED ORDER — DIPHENHYDRAMINE HCL 50 MG/ML IJ SOLN
12.5000 mg | Freq: Once | INTRAMUSCULAR | Status: AC
Start: 2021-04-12 — End: 2021-04-12
  Administered 2021-04-12: 12.5 mg via INTRAVENOUS

## 2021-04-12 MED ORDER — FAMOTIDINE 20 MG IN NS 100 ML IVPB
20.0000 mg | Freq: Once | INTRAVENOUS | Status: AC
  Administered 2021-04-12: 20 mg via INTRAVENOUS
  Filled 2021-04-12: qty 20

## 2021-04-12 MED ORDER — DIPHENHYDRAMINE HCL 50 MG/ML IJ SOLN
12.5000 mg | Freq: Once | INTRAMUSCULAR | Status: DC
  Filled 2021-04-12: qty 0.25

## 2021-04-12 MED ORDER — METHYLPREDNISOLONE SODIUM SUCC 125 MG IJ SOLR
80.0000 mg | Freq: Once | INTRAMUSCULAR | Status: AC
  Administered 2021-04-12: 80 mg via INTRAVENOUS

## 2021-04-12 NOTE — Progress Notes (Signed)
Patient complained of feeling faint after Venofer infusion. BP taken and it was 61/41 and pulse was 48. New IV started and NS 500 mls hung to gravity. Benadryl 12.5 mg IV given per [name], NP. See flowsheets for vital signs. Per Dr. Marin Olp, give Solu_Medrol 80 mg IV. Per Dr. Marin Olp, she may go home since vital signs are stable and blood pressure is up. IV removed . Patient discharged home with husband.

## 2021-04-12 NOTE — Telephone Encounter (Signed)
One week iron tx and lab/visit appts made per sch message 04/12/21, pt to gain sch in tx/avs and through [employer]

## 2021-04-12 NOTE — Patient Instructions (Signed)

## 2021-04-13 ENCOUNTER — Telehealth: Payer: Self-pay

## 2021-04-13 ENCOUNTER — Other Ambulatory Visit: Payer: Self-pay | Admitting: Family

## 2021-04-13 NOTE — Telephone Encounter (Signed)
Called and left a vm with updated appt for iron tx  per 04/13/21 sch message   Carolyn Chaney

## 2021-04-19 ENCOUNTER — Ambulatory Visit: Payer: 59

## 2021-04-20 ENCOUNTER — Other Ambulatory Visit: Payer: Self-pay

## 2021-04-20 ENCOUNTER — Ambulatory Visit (INDEPENDENT_AMBULATORY_CARE_PROVIDER_SITE_OTHER): Payer: 59 | Admitting: Gastroenterology

## 2021-04-20 ENCOUNTER — Encounter: Payer: Self-pay | Admitting: Gastroenterology

## 2021-04-20 VITALS — BP 122/76 | HR 72 | Ht 66.0 in | Wt 140.8 lb

## 2021-04-20 DIAGNOSIS — K56699 Other intestinal obstruction unspecified as to partial versus complete obstruction: Secondary | ICD-10-CM

## 2021-04-20 DIAGNOSIS — K269 Duodenal ulcer, unspecified as acute or chronic, without hemorrhage or perforation: Secondary | ICD-10-CM

## 2021-04-20 DIAGNOSIS — K2100 Gastro-esophageal reflux disease with esophagitis, without bleeding: Secondary | ICD-10-CM

## 2021-04-20 DIAGNOSIS — D509 Iron deficiency anemia, unspecified: Secondary | ICD-10-CM | POA: Diagnosis not present

## 2021-04-20 DIAGNOSIS — K56609 Unspecified intestinal obstruction, unspecified as to partial versus complete obstruction: Secondary | ICD-10-CM

## 2021-04-20 NOTE — Progress Notes (Signed)
P  Chief Complaint:    Hospital follow-up, small bowel obstruction  GI History: 60 year old female initially seen in the GI clinic 09/16/2020 for evaluation of hematochezia and IDA.  History of IDA dating back to 09/2011 with intermittent IV iron infusions over the years.  EGD/colonoscopy completed in 02/2016 for evaluation of IDA as outlined below.   Endoscopic/GI Evaluation history: -Per patient reports history of NSAID induced ulcers with bowel obstruction 2/2 stricture in ~1998. Ex-lap with appendectomy performed. UGI series at that time n/f SB obstruction per patient, and treated with NSAID cessation and conservative management. She had extensive w/u for migraines, but eventuallly resorted to NSAIDs again as the only effective tx. -Colonoscopy (11/2002): Normal except for erosion in the cecum.  Random biopsies demonstrate active colitis without chronic changes -EGD (2007): Normal including normal small bowel biopsies -Colonoscopy (01/15/2010): Normal.  Rectal hyperplastic polyps.  Repeat 10 years -09/16/2011: Ferritin 6 -CT enterography (09/2011): Distal ileum with several short segments of bowel wall thickening and mucosal enhancement (ileitis).  No obstruction -EGD (02/16/2016, Dr. Howell Rucks): Normal.  Biopsies negative for celiac -Colonoscopy (02/16/2016, Dr. Howell Rucks): Shallow erosion at IC valve (biopsy: Nonspecific ulcer), Otherwise normal colon.  Unable to intubate IC valve and examined terminal ileum - 07/2020: Ferritin <4, iron 29, TIBC 474, sat 6%.  H/H 8.8/29.9 with MCV/RDW 86/30.7. -Colonoscopy (11/12/2020, Dr. Bryan Lemma): nonbleeding grade 1 internal hemorrhoids, moderate stenosis of the ileocecal valve which was eventually traversed, second stenosis approximately 2 cm proximal to the ICV which could not be traversed, visualized mucosa in the terminal ileum was erythematous and with ulcer x2,; at that time depending on biopsy results may plan for a trial of budesonide/possibly  repeat CTE/MRE for further small bowel interrogation; pathology showed no active or chronic inflammatory changes to support or suggest Crohn's disease - EGD (11/12/2020, Dr. Bryan Lemma): LA grade A reflux esophagitis and gastritis; placed on pantoprazole 40 twice a day and Carafate; pathology showed mild gastritis with no evidence of H. pylori - Hospital admission 03/2021 for small bowel obstruction - CT (04/04/2021): Fluid-filled mildly dilated distal small bowel with mucosal thickening and stricturing at the terminal ileum suggesting partial obstruction at this location.  Possible duodenal ulcer - MR Enterography (04/07/2021): Persistent wall thickening and enhancement of the distal and terminal ileum.  Suspect stricture narrowing likely from chronic inflammation (chronic underlying scarring change with fibrosis) plus active inflammation causing combination of mild partial functional mechanical obstruction. - 04/2021: IV iron   HPI:     Patient is a 60 y.o. female presenting to the [facility]ology Clinic for hospital follow-up.  Admitted 4/23-28 with abdominal pain and nausea/vomiting, diagnosed with small bowel obstruction 2/2 stricturing at terminal ileum.  Inpatient evaluation notable for the following: - CT (04/04/2021): Fluid-filled mildly dilated distal small bowel with mucosal thickening and stricturing at the terminal ileum suggesting partial obstruction at this location.  Possible duodenal ulcer - MR Enterography (04/07/2021): Persistent wall thickening and enhancement of the distal and terminal ileum.  Suspect stricture narrowing likely from chronic inflammation (chronic underlying scarring change with fibrosis) plus active inflammation causing combination of mild partial functional mechanical obstruction. -GI service consulted diagnosed with likely NSAID related ileal stricture vs Crohn's.  Recommended trial of budesonide and surgical consult.  Also diagnosed with PUD, treated with high-dose PPI  x8 weeks.  Today, states she overall feels better, but still with only 1 "real BM" since hospital d/c. States she has "malaise" with any laxatives, so she tends to avoid these.  Similar sxs with multiple classes of meds, to include cold meds, antihistamines.  Took budesonide 9 mg/day for only a few days, then decreased to 6 mg/day, and now taking 3 mg/day as she is concerned about medication induced symptoms.  Had IV iron on 5/2 c/b hypotension, faint feeling and treated with Benadryl and Solumedrol.   Has an appointment with Dr[facility]cello [facility] next [facility].  Review of systems:     No chest pain, no SOB, no fevers, no urinary sx   Past Medical History:  Diagnosis Date  . Anxiety   . Breast mass, right   . Chronic GI bleeding 01/27/2016  . Complication of anesthesia   . Depression   . Dysrhythmia    PVC,s, atrial tachycardia  . Gastric ulcer   . Hiatal hernia   . Iron deficiency anemia 11/07/2011  . Irregular heart rate   . PONV (postoperative nausea and vomiting)   . SBO (small bowel obstruction) ([place])    in 2 different places per patient     Patient's surgical history, family medical history, social history, medications and allergies were all reviewed in Epic    Current Outpatient Medications  Medication Sig Dispense Refill  . acetaminophen (TYLENOL) 500 MG tablet Take 500 mg by mouth every 6 (six) hours as needed for mild pain.    Marland Kitchen ALPRAZolam (XANAX) 0.5 MG tablet Take 0.25 mg by mouth daily as needed for anxiety.     Marland Kitchen ascorbic acid (VITAMIN C) 500 MG tablet Take 500 mg by mouth daily as needed.     . budesonide (ENTOCORT EC) 3 MG 24 hr capsule Take 3 capsules (9 mg total) by mouth daily. (Patient taking differently: Take 3 mg by mouth daily.) 90 capsule 3  . cholecalciferol (VITAMIN D) 1000 units tablet Take 1,000 Units by mouth daily as needed.     . metoprolol succinate (TOPROL-XL) 25 MG 24 hr tablet Take 1 tablet (25 mg total) by mouth daily. (Patient taking  differently: Take 25 mg by mouth daily. Take 1/2 a tablet by mouth daily) 90 tablet 3  . pantoprazole (PROTONIX) 40 MG tablet Take 1 tablet (40 mg total) by mouth 2 (two) times daily. (Patient not taking: Reported on 04/20/2021) 60 tablet 0  . sertraline (ZOLOFT) 100 MG tablet Take 150 mg by mouth daily.  (Patient not taking: Reported on 04/20/2021)    . vitamin B-12 (CYANOCOBALAMIN) 1000 MCG tablet Take 1,000 mcg by mouth daily as needed.      No current facility-administered medications for this visit.    Physical Exam:     BP 122/76 (BP Location: Right Arm, Patient Position: Sitting, Cuff Size: Small)   Pulse 72   Ht 5' 6"  (1.676 m)   Wt 140 lb 12.8 oz (63.9 kg)   BMI 22.73 kg/m   GENERAL:  Pleasant female in NAD PSYCH: : Cooperative, normal affect NEURO: Alert and oriented x 3, no focal neurologic deficits   IMPRESSION and PLAN:    1) Ileal stricture 2) History of small bowel obstruction  60 year old female with recent admission for SBO presumed 2/2 NSAID induced stricturing small bowel disease.  Colonoscopy 11/2020 with stricturing TI but biopsies negative for Crohn's Disease.  - Low residue diet -keep appointment with Dr. Marcello Moores at [facility] next week as scheduled - Patient scheduled for discussion at next multidisciplinary GI conference - Discussed budesonide, but patient does not want to do 9 mg dosing due to issues with medication tolerance - Continue  complete NSAID avoidance  3) Possible duodenal ulcer - Possible ulcer seen on admission CT.  Patient not taking Protonix as prescribed, again due to issues with medication tolerance cited above - Continue to follow clinically - If unable to do medications, may consider repeat endoscopy in 8+ weeks to evaluate for healing  4) Iron deficiency anemia - Continue follow-up with me Hematology clinic  5) GERD with erosive esophagitis - As above, not taking PPI currently - Continue antireflux lifestyle/dietary  modifications  I spent 40 minutes of time, including in depth chart review, independent review of results as outlined above, communicating results with the patient directly, face-to-face time with the patient, coordinating care, ordering studies and medications as appropriate, and documentation.       [place] ,DO, FACG 04/20/2021, 10:28 AM

## 2021-04-20 NOTE — Patient Instructions (Signed)
If you are age 60 or older, your body mass index should be between 23-30. Your Body mass index is 22.73 kg/m. If this is out of the aforementioned range listed, please consider follow up with your Primary Care Provider.  If you are age 60 or younger, your body mass index should be between 19-25. Your Body mass index is 22.73 kg/m. If this is out of the aformentioned range listed, please consider follow up with your Primary Care Provider.   Due to recent changes in healthcare laws, you may see the results of your imaging and laboratory studies on MyChart before your provider has had a chance to review them.  We understand that in some cases there may be results that are confusing or concerning to you. Not all laboratory results come back in the same time frame and the provider may be waiting for multiple results in order to interpret others.  Please give [place] 48 hours in order for your provider to thoroughly review all the results before contacting the office for clarification of your results.   We have given you a handout on Low Residual Diet.  Thank you for choosing me and [facility].  Vito Cirigliano, D.O.

## 2021-04-26 ENCOUNTER — Inpatient Hospital Stay: Payer: 59

## 2021-04-28 ENCOUNTER — Ambulatory Visit: Payer: Self-pay | Admitting: General Surgery

## 2021-04-28 ENCOUNTER — Telehealth: Payer: Self-pay | Admitting: *Deleted

## 2021-04-28 NOTE — H&P (Signed)
The patient is a 60 year old female who presents with small bowel obstruction. 60 year old female who presents to the office with complaints of obstructive like symptoms. She has a long-standing approximately 20 year history of difficulty with small bowel ulcerations thought to be due to NSAID use. She has underwent multiple colonoscopies and endoscopy procedures. Biopsies have shown inflammation only. She has trouble with constipation and denies any diarrhea. She has trouble with anemia and has to get routine iron infusions. She has had a significant reaction to this in the past few weeks. She is on a low residue diet to help with her bowel obstruction. CT scan and MRI of the abdomen were reviewed. This shows several strictured areas in the terminal ileum causing a mild to moderate small bowel obstruction. Patient has a history of a laparoscopic appendectomy approximately 20 years ago as well.   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 Breast Mass; Local Excision Right. Open Inguinal Hernia Surgery Bilateral. Oral Surgery Tonsillectomy  Diagnostic Studies History Leighton Ruff, MD; 07/25/4817 10:34 AM) Colonoscopy 5-10 years ago Mammogram within last year Pap Smear 1-5 years ago  Allergies Mammie Lorenzo, LPN; 5/63/1497 02:63 AM) Venofer *HEMATOPOIETIC AGENTS* Syncope, tingling in arms and hands V-R Laxatives *LAXATIVES* Fleet Enema *LAXATIVES* Antihistamines, Diphenhydramine-type Zithromax *MACROLIDES* Zofran *ANTIEMETICS* NSAIDs No Known Drug Allergies [08/28/2019]: (Marked as  Inactive) Allergies Reconciled  Medication History Mammie Lorenzo, LPN; 7/85/8850 27:74 AM) ALPRAZolam (0.25MG Tablet, Oral) Active. Sertraline HCl (100MG Tablet, Oral) Active. Metoprolol Succinate ER (25MG Tablet ER 24HR, Oral) Active. Budesonide (3MG Capsule DR Part, Oral) Active. Pantoprazole Sodium (40MG Tablet DR, Oral) Active. Medications Reconciled  Social History Leighton Ruff, MD; 01/08/7866 10:34 AM) Alcohol use Occasional alcohol use. Caffeine use Carbonated beverages, Coffee. No drug use Tobacco use Never smoker.  Family History Leighton Ruff, MD; 6/72/0947 10:34 AM) Depression Mother. Diabetes Mellitus Sister. Heart Disease Father, Sister. Heart disease in female family member before age 63 Hypertension Father, Sister. Rectal Cancer Sister. Respiratory Condition Father. Thyroid problems Mother, Sister.  Pregnancy / Birth History Leighton Ruff, MD; 0/96/2836 10:34 AM) Age at menarche 7 years. Age of menopause 57-50 Contraceptive History Oral contraceptives. Gravida 1 Length (months) of breastfeeding 3-6 Maternal age 31-35 Para 1  Other Problems Leighton Ruff, MD; 06/09/4764 10:34 AM) Hemorrhoids Lump In Breast Migraine Headache Umbilical Hernia Repair     Review of Systems Leighton Ruff MD; 4/65/0354 10:34 AM) General Not Present- Appetite Loss, Chills, Fatigue, Fever, Night Sweats, Weight Gain and Weight Loss. Skin Not Present- Change in Wart/Mole, Dryness, Hives, Jaundice, New Lesions, Non-Healing Wounds, Rash and Ulcer. HEENT Not Present- Earache, Hearing Loss, Hoarseness, Nose Bleed, Oral Ulcers, Ringing in the Ears, Seasonal Allergies, Sinus Pain, Sore Throat, Visual Disturbances, Wears glasses/contact lenses and Yellow Eyes. Respiratory Not Present- Bloody sputum, Chronic Cough, Difficulty Breathing, Snoring and Wheezing. Breast Present- Breast Pain. Not Present- Breast Mass, Nipple Discharge and Skin  Changes. Cardiovascular Not Present- Chest Pain, Difficulty Breathing Lying Down, Leg Cramps, Palpitations, Rapid Heart Rate, Shortness of Breath and Swelling of Extremities. Gastrointestinal Not Present- Abdominal Pain, Bloating, Bloody Stool, Change in Bowel Habits, Chronic diarrhea, Constipation, Difficulty Swallowing, Excessive gas, Gets full quickly at meals, Hemorrhoids, Indigestion, Nausea, Rectal Pain and Vomiting. Female Genitourinary Present-  Frequency, Nocturia and Urgency. Not Present- Painful Urination and Pelvic Pain. Musculoskeletal Not Present- Back Pain, Joint Pain, Joint Stiffness, Muscle Pain, Muscle Weakness and Swelling of Extremities. Neurological Not Present- Decreased Memory, Fainting, Headaches, Numbness, Seizures, Tingling, Tremor, Trouble walking and Weakness. Psychiatric Not Present- Anxiety, Bipolar, Change in Sleep Pattern, Depression, Fearful and Frequent crying. Endocrine Not Present- Cold Intolerance, Excessive Hunger, Hair Changes, Heat Intolerance, Hot flashes and New Diabetes. Hematology Not Present- Blood Thinners, Easy Bruising, Excessive bleeding, Gland problems, HIV and Persistent Infections.  Vitals Claiborne Billings Dockery LPN; 2/53/6644 03:47 AM) 04/28/2021 10:01 AM Weight: 140.2 lb Height: 66in Body Surface Area: 1.72 m Body Mass Index: 22.63 kg/m  Pulse: 83 (Regular)  BP: 120/68(Sitting, Left Arm, Standard)        Physical Exam Leighton Ruff MD; 04/05/9562 10:34 AM)  General Mental Status-Alert. General Appearance-Cooperative.  Abdomen Palpation/Percussion Palpation and Percussion of the abdomen reveal - Soft. Note: non-distended.    Assessment & Plan Leighton Ruff MD; 8/75/6433 10:37 AM)  SMALL BOWEL STRICTURE [phone]) Impression: 60 year old female with a long-standing history of small bowel stricture and ulcerations due to NSAID use. She is now chronically anemic and obstructed and only able to tolerate a low residue diet.  She is also having allergic reactions to IV iron supplementation. I have recommended resection of her terminal ileum as well as ileocecectomy. We will plan on doing this laparoscopically. We have discussed this in detail including risk of future bowel obstructions, bleeding, infection, anastomotic dehiscence, and damage to adjacent structures. All questions were answered. We also did discuss that the surgery is not guaranteed to stop her anemia. Patient agrees to proceed.

## 2021-04-28 NOTE — Telephone Encounter (Signed)
Call received from patient stating that she is "scared to receive any more iron" and would like to speak with Dr. Marin Olp or Judson Roch regarding next iron infusion.  Informed pt that I would inform Judson Roch that she would like a phone call regarding further iron infusions.  Jory Ee NP notified.

## 2021-04-29 ENCOUNTER — Telehealth: Payer: Self-pay | Admitting: Family

## 2021-04-29 ENCOUNTER — Other Ambulatory Visit: Payer: Self-pay | Admitting: Family

## 2021-04-29 NOTE — Telephone Encounter (Signed)
I was able to speak Carolyn Chaney regarding her iron infusions. She is concerned about having another severe reaction with infusion even with change in iron and premeds. At this time we will hold off and recheck her lab work on 6/7.  She met with a surgeon earlier this week and is still considering surgery for intestinal stricture. She plans to follow-up with Dr. Bryan Lemma as well.  At this time she will try taking liquid iron and see if this is better absorbed than the pill.  No other questions at this time. Patient appreciative of call.

## 2021-04-30 ENCOUNTER — Ambulatory Visit: Payer: 59

## 2021-05-04 ENCOUNTER — Ambulatory Visit: Payer: 59 | Admitting: Student

## 2021-05-05 ENCOUNTER — Telehealth: Payer: Self-pay | Admitting: Gastroenterology

## 2021-05-05 NOTE — Telephone Encounter (Signed)
Dr. Bryan Lemma, please see note below. Was patient's case already discussed at multidisciplinary conference? Please advise, thanks.

## 2021-05-05 NOTE — Telephone Encounter (Signed)
Inbound call from patient requesting a call please in regards to her case being discussed in a conference.  Please advise.

## 2021-05-05 NOTE — Telephone Encounter (Signed)
Case discussed in GI multi-disciplinary conference this week. Imaging studies reviewed and results discussed w/ patient today. We reviewed her case over the phone today. Discussed possibility of stricturing Crohn's disease with previously reported NSAID related strictures as a potential red herring to her case.  Imaging with long 10+ centimeter stricture in the distal ileum with some superimposed inflammatory changes as well.  She reports having very rare NSAID use since age 60, and thinks none at all in last 10-12 years. She was seen last week by Dr. Marcello Moores with discussion of ileal resection and anastamosis. She is also seeking 2nd surgical opinion at [facility]. She is still on low residue diet and has started taking budesonide 6 mg/day again. We discussed options to include the following:  - Trial of Prednisone to decrease some of the overlapping inflammation that is likely superimposed on a fixed stricture. Would do this in conjunction with serial ESR, CRP - Go direct to surgery and if confirmed Crohns, plan for biologic therapy 2 months later  She would to hold off on Prednisone for now and will plan on meeting with the Colorectal Surgeon at [facility] and get back to me with how she would like to proceed.   [place], can you please coordinate sending medical records to Dr. Hester Mates at [facility] (fax: [phone], office: [phone]) in anticipation of her upcoming appointment.

## 2021-05-06 NOTE — Telephone Encounter (Signed)
Records, demographic and insurance information faxed to Dr. Zollie Scale attention at [facility].

## 2021-05-07 NOTE — Telephone Encounter (Signed)
Spoke with patient, she is aware that I have re-faxed her records to [employer] at the fax number provided. Patient had no concerns at the end of the call.

## 2021-05-07 NOTE — Telephone Encounter (Signed)
Pt called stating that [facility] needs colon and egd reports and path from Dec 2021 because they have not received them. Pls fax it attn to [place] at [phone].  Her phone is [phone].

## 2021-05-12 ENCOUNTER — Telehealth: Payer: Self-pay | Admitting: *Deleted

## 2021-05-12 ENCOUNTER — Inpatient Hospital Stay: Payer: 59 | Attending: Hematology & Oncology

## 2021-05-12 ENCOUNTER — Other Ambulatory Visit: Payer: Self-pay

## 2021-05-12 ENCOUNTER — Other Ambulatory Visit: Payer: Self-pay | Admitting: *Deleted

## 2021-05-12 DIAGNOSIS — D500 Iron deficiency anemia secondary to blood loss (chronic): Secondary | ICD-10-CM

## 2021-05-12 DIAGNOSIS — K922 Gastrointestinal hemorrhage, unspecified: Secondary | ICD-10-CM | POA: Insufficient documentation

## 2021-05-12 LAB — CBC WITH DIFFERENTIAL (CANCER CENTER ONLY)
Abs Immature Granulocytes: 0.01 10*3/uL (ref 0.00–0.07)
Basophils Absolute: 0 10*3/uL (ref 0.0–0.1)
Basophils Relative: 1 %
Eosinophils Absolute: 0.1 10*3/uL (ref 0.0–0.5)
Eosinophils Relative: 1 %
HCT: 35.4 % — ABNORMAL LOW (ref 36.0–46.0)
Hemoglobin: 11.4 g/dL — ABNORMAL LOW (ref 12.0–15.0)
Immature Granulocytes: 0 %
Lymphocytes Relative: 33 %
Lymphs Abs: 1.4 10*3/uL (ref 0.7–4.0)
MCH: 29.8 pg (ref 26.0–34.0)
MCHC: 32.2 g/dL (ref 30.0–36.0)
MCV: 92.7 fL (ref 80.0–100.0)
Monocytes Absolute: 0.4 10*3/uL (ref 0.1–1.0)
Monocytes Relative: 10 %
Neutro Abs: 2.3 10*3/uL (ref 1.7–7.7)
Neutrophils Relative %: 55 %
Platelet Count: 241 10*3/uL (ref 150–400)
RBC: 3.82 MIL/uL — ABNORMAL LOW (ref 3.87–5.11)
RDW: 15.7 % — ABNORMAL HIGH (ref 11.5–15.5)
WBC Count: 4.2 10*3/uL (ref 4.0–10.5)
nRBC: 0 % (ref 0.0–0.2)

## 2021-05-12 LAB — RETICULOCYTES
Immature Retic Fract: 11.6 % (ref 2.3–15.9)
RBC.: 3.83 MIL/uL — ABNORMAL LOW (ref 3.87–5.11)
Retic Count, Absolute: 47.9 10*3/uL (ref 19.0–186.0)
Retic Ct Pct: 1.3 % (ref 0.4–3.1)

## 2021-05-12 NOTE — Telephone Encounter (Signed)
Call received from patient requesting to have lab work scheduled for 05/18/21 done sooner due to upcoming surgery at [facility].  OK for pt to come in today for lab work per order of S. [place] NP.  Pt notified and message sent to scheduling.

## 2021-05-13 ENCOUNTER — Telehealth: Payer: Self-pay | Admitting: *Deleted

## 2021-05-13 LAB — IRON AND TIBC
Iron: 81 ug/dL (ref 41–142)
Saturation Ratios: 28 % (ref 21–57)
TIBC: 283 ug/dL (ref 236–444)
UIBC: 202 ug/dL (ref 120–384)

## 2021-05-13 LAB — FERRITIN: Ferritin: 80 ng/mL (ref 11–307)

## 2021-05-13 NOTE — Telephone Encounter (Signed)
-----   Message from [name], MD sent at 05/13/2021 11:26 AM EDT ----- Call - the iron studies are ok!  Carolyn Chaney

## 2021-05-13 NOTE — Telephone Encounter (Signed)
Per scheduling message Carolyn Chaney 05/13/21 - called and gave patient upcoming appointments

## 2021-05-13 NOTE — Telephone Encounter (Signed)
Patient notified per order of Dr. Marin Olp that "the iron studies are ok!  Carolyn Chaney"  Pt appreciative of call and would like to move appts scheduled for 05/18/21 to September 2022.  Dr. Marin Olp notified and message sent to scheduling.

## 2021-05-18 ENCOUNTER — Inpatient Hospital Stay: Payer: 59 | Admitting: Family

## 2021-05-18 ENCOUNTER — Ambulatory Visit: Payer: 59 | Admitting: Student

## 2021-05-18 ENCOUNTER — Inpatient Hospital Stay: Payer: 59

## 2021-05-19 NOTE — Progress Notes (Signed)
PCP:  [name], MD Primary Cardiologist: Virl Axe, MD Electrophysiologist: Virl Axe, MD   Carolyn Chaney is a 60 y.o. female seen today for Virl Axe, MD for routine electrophysiology followup.  Since last being seen in our clinic the patient reports doing OK from a cardiac perspective.  She had a CT abdomen 03/2021 that was suggestive of partial bowel obstruction and possible Crohn disease with a small amount of reactive free fluid in the abdomen and pelvis.   Seen by Dr. Marcello Moores in general surgery here with notes of "She has a long-standing approximately 20 year history of difficulty with small bowel ulcerations thought to be due to NSAID use.  She has underwent multiple colonoscopies and endoscopy procedures.  Biopsies have shown inflammation only.  She has trouble with constipation and denies any diarrhea.  She has trouble with anemia and has to get routine iron infusions.  She has had a significant reaction to this in the past few weeks.  She is on a low residue diet to help with her bowel obstruction.  CT scan and MRI of the abdomen were reviewed.  This shows several strictured areas in the terminal ileum causing a mild to moderate small bowel obstruction.  Patient has a history of a laparoscopic appendectomy approximately 20 years ago as well."  She struggles with chronic anemia.  Pt got second opinion from [facility] colorectal team and is planning laparosocopy with possible open ileocecectomy 05/24/21.  She was told that she likely had a "hereditary" issue with build up of calcium and was urged to follow up closely with her cardiologist. The only data I can see is a coronary CT in March that shows a calcium score of 0. Re-assurance given. She is anxious about upcoming surgery and with grief from father passing away earlier this year. Her sister with multiple medical issues was also rushed to the hospital this am for unclear reasons.  She is straightening up her fathers affairs and  home and has had continual issues with this.  She feels abdominal pain and diminished effect of her Zoloft, and has been weaning off, reportedly with provider knowledge. She has chronically high cholesterol and "every one" in her family has had statin intolerance. She has occasional palpitations that are anxiety inducing but not activity limiting.   Past Medical History:  Diagnosis Date   Anxiety    Breast mass, right    Chronic GI bleeding 8/89/1694   Complication of anesthesia    Depression    Dysrhythmia    PVC,s, atrial tachycardia   Gastric ulcer    Hiatal hernia    Iron deficiency anemia 11/07/2011   Irregular heart rate    PONV (postoperative nausea and vomiting)    SBO (small bowel obstruction) ([place])    in 2 different places per patient    Past Surgical History:  Procedure Laterality Date   APPENDECTOMY     BREAST EXCISIONAL BIOPSY Right 1980   Benign   BREAST LUMPECTOMY WITH RADIOACTIVE SEED LOCALIZATION Right 10/09/2019   Procedure: RIGHT BREAST LUMPECTOMY WITH RADIOACTIVE SEED LOCALIZATION;  Surgeon: Fanny Skates, MD;  Location: [facility];  Service: General;  Laterality: Right;   BREAST SURGERY     x2   COLONOSCOPY WITH PROPOFOL N/A 02/16/2016   Procedure: COLONOSCOPY WITH PROPOFOL;  Surgeon: Garlan Fair, MD;  Location: [facility];  Service: Endoscopy;  Laterality: N/A;   ESOPHAGOGASTRODUODENOSCOPY (EGD) WITH PROPOFOL N/A 02/16/2016   Procedure: ESOPHAGOGASTRODUODENOSCOPY (EGD) WITH PROPOFOL;  Surgeon: Garlan Fair, MD;  Location: [facility];  Service: Endoscopy;  Laterality: N/A;   HERNIA REPAIR     bilateral inguinal hernia as child   TONSILLECTOMY      Current Outpatient Medications  Medication Sig Dispense Refill   acetaminophen (TYLENOL) 500 MG tablet Take 500 mg by mouth every 6 (six) hours as needed for mild pain.     ALPRAZolam (XANAX) 0.5 MG tablet Take 0.25 mg by mouth daily as needed for anxiety.      ascorbic acid (VITAMIN C)  500 MG tablet Take 500 mg by mouth daily as needed.      budesonide (ENTOCORT EC) 3 MG 24 hr capsule Take 3 capsules (9 mg total) by mouth daily. 90 capsule 3   cholecalciferol (VITAMIN D) 1000 units tablet Take 1,000 Units by mouth daily as needed.      metoprolol succinate (TOPROL-XL) 25 MG 24 hr tablet Take 1 tablet (25 mg total) by mouth daily. 90 tablet 3   pantoprazole (PROTONIX) 40 MG tablet Take 1 tablet (40 mg total) by mouth 2 (two) times daily. 60 tablet 0   sertraline (ZOLOFT) 100 MG tablet Take 50 mg by mouth daily.     vitamin B-12 (CYANOCOBALAMIN) 1000 MCG tablet Take 1,000 mcg by mouth daily as needed.      No current facility-administered medications for this visit.    Allergies  Allergen Reactions   Nsaids Other (See Comments)    Bleeding    Other     Laxatives and fleet emema as well and antihistamines   Venofer [Iron Sucrose] Other (See Comments)    Syncope, Numbness and tingling in arms and hands.   Zofran [Ondansetron]     malice   Zithromax [Azithromycin]     Social History   Socioeconomic History   Marital status: Married    Spouse name: Not on file   Number of children: Not on file   Years of education: Not on file   Highest education level: Not on file  Occupational History   Not on file  Tobacco Use   Smoking status: Never   Smokeless tobacco: Never  Vaping Use   Vaping Use: Never used  Substance and Sexual Activity   Alcohol use: Yes    Alcohol/week: 0.0 standard drinks    Comment: ocassionally   Drug use: No   Sexual activity: Not on file  Other Topics Concern   Not on file  Social History Narrative   Not on file   Social Determinants of Health   Financial Resource Strain: Not on file  Food Insecurity: Not on file  Transportation Needs: Not on file  Physical Activity: Not on file  Stress: Not on file  Social Connections: Not on file  Intimate Partner Violence: Not on file     Review of Systems: General: No chills, fever,  night sweats or weight changes  Cardiovascular:  No chest pain, dyspnea on exertion, edema, orthopnea, palpitations, paroxysmal nocturnal dyspnea Dermatological: No rash, lesions or masses Respiratory: No cough, dyspnea Urologic: No hematuria, dysuria Abdominal: No nausea, vomiting, diarrhea, bright red blood per rectum, melena, or hematemesis Neurologic: No visual changes, weakness, changes in mental status All other systems reviewed and are otherwise negative except as noted above.  Physical Exam: Vitals:   05/21/21 1011  BP: 116/72  Pulse: 70  SpO2: 98%  Weight: 138 lb 3.2 oz (62.7 kg)  Height: 5' 5"  (1.651 m)    GEN- The patient is well appearing,  alert and oriented x 3 today.   HEENT: normocephalic, atraumatic; sclera clear, conjunctiva pink; hearing intact; oropharynx clear; neck supple, no JVP Lymph- no cervical lymphadenopathy Lungs- Clear to ausculation bilaterally, normal work of breathing.  No wheezes, rales, rhonchi Heart- Regular rate and rhythm, no murmurs, rubs or gallops, PMI not laterally displaced GI- soft, non-tender, non-distended, bowel sounds present, no hepatosplenomegaly Extremities- no clubbing, cyanosis, or edema; DP/PT/radial pulses 2+ bilaterally MS- no significant deformity or atrophy Skin- warm and dry, no rash or lesion Psych- euthymic mood, full affect Neuro- strength and sensation are intact  EKG is ordered. Personal review of EKG from today shows NSR at 70 bpm  Additional studies reviewed include: Previous EP office note. Notes from [place] in [facility] concerning upcoming surgery.   03/11/2021 CT scoring at [facility]: Screening   FINDINGS:   CALCIUM SCORE: 0.   LM: 0.  LAD: 0.  Cx: 0.  RCA: 0  PDA: 0   Cardiac anatomy:  Normal heart size, no evidence of pericardial effusion  Mediastinum: No adenopathy  Visible abdomen: Small left lobe liver cyst.  Visible lung fields: Visible portions of the lungs are  clear  Assessment and Plan:  1. Atrial tachycardia/SVT Remains well controlled on lopressor.  OK to take 12.5 mg as needed as she has been doing with good results.  Labs stable on recently scanned reports These are more anxiety inducing than symptomatic. Re-assurance again given. Previously discussed ablation vs flecainide. Pt deferred flecainide due to concern over side effects.  Continued to encourage her that SVT is typically not considered life threatening in patients with otherwise structurally normal hearts (Normal Myoview 2004)  2. Stricture of small intestine Pending laparosocopy with possible open ileocecectomy 05/24/21 at [facility] [place].  3. HLD Reports LDL > 100.  She is very concerned about taking statins with no-one in her family tolerating statins. Refer to Lipid Clinic. Pt to furnish more recent lipids (none in system since 2013) I informed her she may be required to prove statin intolerance and not skip them all together.  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 No notes to either effect that I can find  RTC 6 months. Sooner with tachycardia issues.   Shirley Friar, PA-C  05/21/21 10:29 AM

## 2021-05-21 ENCOUNTER — Ambulatory Visit: Payer: 59 | Admitting: Student

## 2021-05-21 ENCOUNTER — Encounter: Payer: Self-pay | Admitting: Student

## 2021-05-21 ENCOUNTER — Other Ambulatory Visit: Payer: Self-pay

## 2021-05-21 VITALS — BP 116/72 | HR 70 | Ht 65.0 in | Wt 138.2 lb

## 2021-05-21 DIAGNOSIS — K56609 Unspecified intestinal obstruction, unspecified as to partial versus complete obstruction: Secondary | ICD-10-CM

## 2021-05-21 DIAGNOSIS — I471 Supraventricular tachycardia: Secondary | ICD-10-CM

## 2021-05-21 DIAGNOSIS — E785 Hyperlipidemia, unspecified: Secondary | ICD-10-CM

## 2021-05-21 NOTE — Patient Instructions (Signed)
Medication Instructions:  Your physician recommends that you continue on your current medications as directed. Please refer to the Current Medication list given to you today.  *If you need a refill on your cardiac medications before your next appointment, please call your pharmacy*   Lab Work: None If you have labs (blood work) drawn today and your tests are completely normal, you will receive your results only by: [place] (if you have MyChart) OR A paper copy in the mail If you have any lab test that is abnormal or we need to change your treatment, we will call you to review the results.   Follow-Up: At [facility], you and your health needs are our priority.  As part of our continuing mission to provide you with exceptional heart care, we have created designated Provider Care Teams.  These Care Teams include your primary Cardiologist (physician) and Advanced Practice Providers (APPs -  Physician Assistants and Nurse Practitioners) who all work together to provide you with the care you need, when you need it.  We recommend signing up for the patient portal called "MyChart".  Sign up information is provided on this After Visit Summary.  MyChart is used to connect with patients for Virtual Visits (Telemedicine).  Patients are able to view lab/test results, encounter notes, upcoming appointments, etc.  Non-urgent messages can be sent to your provider as well.   To learn more about what you can do with MyChart, go to [email or web address].    Your next appointment:   6 month(s)  The format for your next appointment:   In Person  Provider:   Virl Axe, MD

## 2021-05-26 HISTORY — PX: BOWEL RESECTION: SHX1257

## 2021-06-08 ENCOUNTER — Telehealth: Payer: Self-pay | Admitting: Gastroenterology

## 2021-06-08 NOTE — Telephone Encounter (Signed)
Pt had bowel resection at [facility] last 05/26/21. She would like for Dr. Bryan Lemma to review her records available in [place]. Pt made an appt w. Dr. Loletha Grayer on 07/27/21. Pt also wants to know if she needs to continue taking two medications that Dr. Bryan Lemma prescribed since she had surgery. Pls call her.

## 2021-06-09 NOTE — Telephone Encounter (Signed)
Spoke with patient in regards to recommendations per Dr. Loletha Grayer below. Patient is currently scheduled for a follow up with Dr. Loletha Grayer on Tuesday, 07/27/21 at 3:20 PM. Patient has several questions regarding Protonix, she has been taking Protonix 40 mg daily. She is concerned about continuing Protonix for a long period of time. Patient would like to know if she can reduce Protonix to 20 mg daily? Please advise, thanks.

## 2021-06-09 NOTE — Telephone Encounter (Signed)
Thank you for the update.  I was able to review her recent operative note and hospitalization for her right hemicolectomy and ileal resection.  I also reviewed her pathology results as outlined below.  The pathology is certainly suspicious for Crohn's Disease.  Following bowel resection in Crohn's Disease.  Current guidelines are to start biologic therapy about 8 weeks postoperative.  Please schedule an appointment with me (or in the IBD clinic at [facility] if that is her preference given that she was just operated on there) to discuss starting biologic therapy.  Otherwise, no she does not need to continue taking previously prescribed steroid.   A. Right colon and ileum, resection:   Small bowel mucosa with severe chronic active ileitis with ulceration, pericolorectal inflammation, and creeping fat, see note. Colonic mucosa with no specific pathologic diagnosis. Eleven lymph nodes, negative for malignancy.   Note: The specimen shows patchy inflammation of the ileum with ulceration, pseudopyloric metaplasia, submucosal fibrosis, and pericolorectal inflammation (Crohn's-like-reaction). Fissuring ulcers and perforation are not identified. No granulomas or dysplasia are seen. The findings are concerning for Crohn's disease, but cannot strictly exclude medication effects or other etiologies. Clinical correlation is needed.

## 2021-06-10 ENCOUNTER — Ambulatory Visit: Payer: 59

## 2021-06-10 MED ORDER — PANTOPRAZOLE SODIUM 20 MG PO TBEC: 20.0000 mg | DELAYED_RELEASE_TABLET | Freq: Every day | ORAL | 2 refills | Status: DC

## 2021-06-10 NOTE — Telephone Encounter (Signed)
Spoke with patient in regards to recommendations. She is aware prescription was sent to pharmacy on file. Patient verbalized understanding and had no concerns at the end of the call.

## 2021-07-20 ENCOUNTER — Ambulatory Visit: Payer: 59 | Admitting: Internal Medicine

## 2021-07-27 ENCOUNTER — Ambulatory Visit (INDEPENDENT_AMBULATORY_CARE_PROVIDER_SITE_OTHER): Payer: 59 | Admitting: Gastroenterology

## 2021-07-27 ENCOUNTER — Encounter: Payer: Self-pay | Admitting: Gastroenterology

## 2021-07-27 ENCOUNTER — Other Ambulatory Visit: Payer: Self-pay

## 2021-07-27 VITALS — BP 116/64 | HR 76 | Ht 65.5 in | Wt 136.0 lb

## 2021-07-27 DIAGNOSIS — K50919 Crohn's disease, unspecified, with unspecified complications: Secondary | ICD-10-CM | POA: Diagnosis not present

## 2021-07-27 DIAGNOSIS — D509 Iron deficiency anemia, unspecified: Secondary | ICD-10-CM | POA: Diagnosis not present

## 2021-07-27 DIAGNOSIS — K56699 Other intestinal obstruction unspecified as to partial versus complete obstruction: Secondary | ICD-10-CM

## 2021-07-27 DIAGNOSIS — K2100 Gastro-esophageal reflux disease with esophagitis, without bleeding: Secondary | ICD-10-CM

## 2021-07-27 NOTE — Patient Instructions (Addendum)
If you are age 60 or older, your body mass index should be between 23-30. Your Body mass index is 22.29 kg/m. If this is out of the aforementioned range listed, please consider follow up with your Primary Care Provider.  If you are age 88 or younger, your body mass index should be between 19-25. Your Body mass index is 22.29 kg/m. If this is out of the aformentioned range listed, please consider follow up with your Primary Care Provider.   __________________________________________________________  The [place] [place] [facility] providers would like to encourage you to use [facility] to communicate with providers for non-urgent requests or questions.  Due to long hold times on the telephone, sending your provider a message by [facility] may be a faster and more efficient way to get a response.  Please allow 48 business hours for a response.  Please remember that this is for non-urgent requests.   Please go to the lab on the 2nd floor [address] by this Friday please for lab work. There are open 8am-4pm and close for lunch 12pm-1pm  Please call us in 3 weeks if you haven't heard anything regarding your [facility] referral. You should be getting a call from the nurse.  Website is [email or web address]  It was a pleasure to see you today!  Vito Cirigliano, D.O.

## 2021-07-27 NOTE — Progress Notes (Signed)
Chief Complaint:    Post operative follow-up  GI History: 60 year old female initially seen in the GI clinic 09/16/2020 for evaluation of hematochezia and IDA.  History of IDA dating back to 09/2011 with intermittent IV iron infusions over the years.  EGD/colonoscopy completed in 02/2016 for evaluation of IDA as outlined below.     Endoscopic/GI Evaluation history: -Per patient reports history of NSAID induced ulcers with bowel obstruction 2/2 stricture in ~1998. Ex-lap with appendectomy performed. UGI series at that time n/f SB obstruction per patient, and treated with NSAID cessation and conservative management. She had extensive w/u for migraines, but eventuallly resorted to NSAIDs again as the only effective tx. -Colonoscopy (11/2002): Normal except for erosion in the cecum.  Random biopsies demonstrate active colitis without chronic changes -EGD (2007): Normal including normal small bowel biopsies -Colonoscopy (01/15/2010): Normal.  Rectal hyperplastic polyps.  Repeat 10 years -09/16/2011: Ferritin 6 -CT enterography (09/2011): Distal ileum with several short segments of bowel wall thickening and mucosal enhancement (ileitis).  No obstruction -EGD (02/16/2016, Dr. Howell Rucks): Normal.  Biopsies negative for celiac -Colonoscopy (02/16/2016, Dr. Howell Rucks): Shallow erosion at IC valve (biopsy: Nonspecific ulcer), Otherwise normal colon.  Unable to intubate IC valve and examined terminal ileum - 07/2020: Ferritin <4, iron 29, TIBC 474, sat 6%.  H/H 8.8/29.9 with MCV/RDW 86/30.7. -Colonoscopy (11/12/2020, Dr. Bryan Lemma): nonbleeding grade 1 internal hemorrhoids, moderate stenosis of the ileocecal valve which was eventually traversed, second stenosis approximately 2 cm proximal to the ICV which could not be traversed, visualized mucosa in the terminal ileum was erythematous and with ulcer x2,; at that time depending on biopsy results may plan for a trial of budesonide/possibly repeat CTE/MRE for  further small bowel interrogation; pathology showed no active or chronic inflammatory changes to support or suggest Crohn's disease - EGD (11/12/2020, Dr. Bryan Lemma): LA grade A reflux esophagitis and gastritis; placed on pantoprazole 40 twice a day and Carafate; pathology showed mild gastritis with no evidence of H. pylori - Hospital admission 03/2021 for small bowel obstruction - CT (04/04/2021): Fluid-filled mildly dilated distal small bowel with mucosal thickening and stricturing at the terminal ileum suggesting partial obstruction at this location.  Possible duodenal ulcer - MR Enterography (04/07/2021): Persistent wall thickening and enhancement of the distal and terminal ileum.  Suspect stricture narrowing likely from chronic inflammation (chronic underlying scarring change with fibrosis) plus active inflammation causing combination of mild partial functional mechanical obstruction. - 04/2021: IV iron - 04/20/2021: GI follow-up: Was feeling better.  Reports only taking the budesonide 9 mg/day for couple days, then reduce to 6 mg/day for a couple days, and was at 3 mg/day at that time due to concerns about medication induced symptoms. - 05/05/2021: Case discussed at GI multidisciplinary conference.  Higher suspicion for stricturing Crohn's Disease rather than NSAID induced since she has very rare NSAID use since age 34 and none in the last 10-12 years.  Imaging with 10+ centimeter stricture in the distal ileum with some superimposed inflammatory changes.  Recommended trial of prednisone but patient declined. - Evaluated by Dr. Hester Mates at [facility]. - 05/26/2021: Underwent right hemicolectomy and 16 cm of ileal resection at [facility].  Pathology suspicious for Crohn's Disease.   Separately, reflux well controlled since reducing Protonix to 20 mg/day.    HPI:     Patient is a 60 y.o. female presenting to the Gastroenterology Clinic for follow-up.  Last seen by me on 04/20/2021.  Patient did not  want to take budesonide as  prescribed.  Was seen by Dr. Marcello Moores at [place] then by Dr. Hester Mates at [facility] and ultimately underwent right hemicolectomy and ileal resection at [facility] on 05/26/2021.  She followed up at [facility] on 06/29/2021.   Recently went ot the beach for a week with family with dietary indiscretions which eventually lead to subjective fever, fatigue, diarrhea, 4-6 stools/day, abdominal discomfort (not pain). Sxs lasted a few days and resolved with bland diet. Had recurrence about a week later, lasting 2-3 days. 1 week ago, she started taking Budesonide 6 mg/day x3 days, and now 4.5 mg/day with overall improvement.   Keeping a diet diary.   Otherwise, no new labs or imaging for review today.  Review of systems:     No chest pain, no SOB, no fevers, no urinary sx   Past Medical History:  Diagnosis Date   Anxiety    Breast mass, right    Chronic GI bleeding 01/13/5426   Complication of anesthesia    Depression    Dysrhythmia    PVC,s, atrial tachycardia   Gastric ulcer    Hiatal hernia    Iron deficiency anemia 11/07/2011   Irregular heart rate    PONV (postoperative nausea and vomiting)    SBO (small bowel obstruction) ([place])    in 2 different places per patient     Patient's surgical history, family medical history, social history, medications and allergies were all reviewed in Epic    Current Outpatient Medications  Medication Sig Dispense Refill   acetaminophen (TYLENOL) 500 MG tablet Take 500 mg by mouth every 6 (six) hours as needed for mild pain.     ALPRAZolam (XANAX) 0.5 MG tablet Take 0.25 mg by mouth daily as needed for anxiety.      ascorbic acid (VITAMIN C) 500 MG tablet Take 500 mg by mouth daily as needed.      budesonide (ENTOCORT EC) 3 MG 24 hr capsule Take 3 capsules (9 mg total) by mouth daily. 90 capsule 3   cholecalciferol (VITAMIN D) 1000 units tablet Take 1,000 Units by mouth daily as needed.      metoprolol succinate  (TOPROL-XL) 25 MG 24 hr tablet Take 1 tablet (25 mg total) by mouth daily. 90 tablet 3   pantoprazole (PROTONIX) 20 MG tablet Take 1 tablet (20 mg total) by mouth daily. 30 tablet 2   sertraline (ZOLOFT) 100 MG tablet Take 50 mg by mouth daily.     vitamin B-12 (CYANOCOBALAMIN) 1000 MCG tablet Take 1,000 mcg by mouth daily as needed.      No current facility-administered medications for this visit.    Physical Exam:     There were no vitals taken for this visit.  GENERAL:  Pleasant female in NAD PSYCH: : Cooperative, normal affect Musculoskeletal:  Normal muscle tone, normal strength NEURO: Alert and oriented x 3, no focal neurologic deficits   IMPRESSION and PLAN:    1) Crohn's Disease 2) History of right hemicolectomy and distal ileal resection 60 year old female with stricturing type ileal Crohn's Disease.  Diagnosis established at the time of ileal resection for chronic, obstructing stricture.  No prior evidence of colonic disease.  Given the risk factors for severe, refractory disease (stricturing disease, ileal disease, need for surgery) I recommended more aggressive management with immunosuppression therapy. Discussed the risks, benefits and alternatives of biologic therapy, and the patient decided to start Entyvio.  - Discussed the risks, benefits and alternatives of biologic therapy with the patient at length, including  the risk of lymphoma, non-melanoma skin CA, serious infection (viral hepatitis, reactivation of TB, zoster, fungal infections), allergic reaction, site reaction, psoriasis, demyelinating disease - Pt does not have history of malignancy  - Pt does not have an active infection, LTBI or zoster  - Send to lab to check Quant Gold, viral hepatitis - No history of Class III or IV heart failure  - LAEs and CBC normal at the start of therapy  -Already follows with Dermatologist at least yearly. Recommend sunscreen and hats - Will need at least yearly micronutrient  surveillance.  Has follow-up already scheduled in the Hematology Clinic - No live vaccines while on immunosuppressive therapy - Will review vaccination status and IBD health maintenance and follow-up - In the meantime, okay to resume her budesonide as she feels this is helping in the short-term - Will start paperwork for insurance approval of Entyvio - Provided with website UpdateRate.fr - If needed, could give trial of Bentyl or Levsin for possible overlapping IBS vs postoperative abdominal cramping  3) GERD with erosive esophagitis - Controlled on current therapy  4) Iron deficiency anemia - Has follow-up appointment scheduled in the Hematology clinic - Related to the above Crohn's Disease  RTC in 3 months or sooner as needed     I spent 45 minutes of time, including in depth chart review, independent review of results as outlined above, communicating results with the patient directly, face-to-face time with the patient, coordinating care, ordering studies and medications as appropriate, and documentation.       Lavena Bullion ,DO, FACG 07/27/2021, 3:32 PM

## 2021-07-28 ENCOUNTER — Encounter: Payer: Self-pay | Admitting: Gastroenterology

## 2021-07-28 ENCOUNTER — Telehealth: Payer: Self-pay | Admitting: Pharmacy Technician

## 2021-07-28 ENCOUNTER — Other Ambulatory Visit: Payer: Self-pay | Admitting: Pharmacy Technician

## 2021-07-28 ENCOUNTER — Telehealth: Payer: Self-pay

## 2021-07-28 DIAGNOSIS — K50919 Crohn's disease, unspecified, with unspecified complications: Secondary | ICD-10-CM | POA: Insufficient documentation

## 2021-07-28 NOTE — Telephone Encounter (Signed)
Auth Follow-up: Payer: Esmeralda of follow up: portal/phone/fax: Portal Reference number: [id number] Medication & CPT/J Code(s) : Entyvio (Vedolizumab) J3380 Units/visits requested: 351m x 4 visits Status: APPROVED Approval [facility]: 07/28/21 [facility]

## 2021-07-28 NOTE — Telephone Encounter (Signed)
Orders have been entered in epic for Entyvio at [facility] infusion center. They will work on PA for [employer] and will contact patient to set up appt if they are in network.  Lm on vm for patient to return call.

## 2021-07-28 NOTE — Addendum Note (Signed)
Addended by: Yevette Edwards on: 07/28/2021 10:28 AM   Modules accepted: Orders

## 2021-07-28 NOTE — Telephone Encounter (Signed)
-----   Message from [place], [place] sent at 07/27/2021  5:02 PM EDT ----- Regarding: Weyman Rodney referral Hey can we see about getting Weyman Rodney started for this patient please? She saw Dr Bryan Lemma 07-27-2021. Thank you!

## 2021-07-28 NOTE — Addendum Note (Signed)
Addended by: Tresa Res on: 07/28/2021 12:21 PM   Modules accepted: Orders

## 2021-07-29 ENCOUNTER — Other Ambulatory Visit (INDEPENDENT_AMBULATORY_CARE_PROVIDER_SITE_OTHER): Payer: 59

## 2021-07-29 ENCOUNTER — Other Ambulatory Visit: Payer: Self-pay

## 2021-07-29 DIAGNOSIS — K50919 Crohn's disease, unspecified, with unspecified complications: Secondary | ICD-10-CM | POA: Diagnosis not present

## 2021-07-29 NOTE — Telephone Encounter (Signed)
Pt returned call, we discussed that her insurance has improved Entyvio and discussed her concerns at length. Advised patient that she will need labs prior to being able to start Entyvio. Pt is very anxious and had several concerns about the Entyvio infusion because she had a bad reaction to IV iron. She also reports that she is not sure if she reacts well to antihistamines. Discussed with patient that every patient is different and we would not know if she is going to have a reaction until they begin the infusion. Patient is worried that she will have a reaction. Assured patient that the [facility] infusion center has protocols that they have to follow in case of emergent reactions. I asked the patient if she would like to discuss further with Dr. Bryan Lemma but she declined, she is very anxious. I recommended that patient call the [facility] infusion center and ask to speak with an infusion nurse. Pt states that she will come in for labs today and will reach out to the infusion center. Pt had no other concerns at the end of the call.

## 2021-07-29 NOTE — Telephone Encounter (Signed)
Lm on vm for patient to return call. My Chart message sent to patient as well.

## 2021-07-29 NOTE — Telephone Encounter (Signed)
Patient returning missed call.. Thanks

## 2021-07-29 NOTE — Telephone Encounter (Signed)
Spoke with patient, see 07/28/21 telephone encounter.

## 2021-07-30 LAB — EXTRA SPECIMEN

## 2021-07-30 LAB — HEPATITIS B SURFACE ANTIGEN: Hepatitis B Surface Ag: NONREACTIVE

## 2021-07-30 LAB — EXTRA LAV TOP TUBE

## 2021-07-30 LAB — HEPATITIS B SURFACE ANTIBODY,QUALITATIVE: Hep B S Ab: NONREACTIVE

## 2021-07-30 LAB — HEPATITIS C ANTIBODY
Hepatitis C Ab: NONREACTIVE
SIGNAL TO CUT-OFF: 0.01 (ref <1.00)

## 2021-07-30 LAB — QUANTIFERON-TB GOLD PLUS

## 2021-07-30 LAB — HEPATITIS A ANTIBODY, TOTAL: Hepatitis A AB,Total: REACTIVE — AB

## 2021-08-03 ENCOUNTER — Other Ambulatory Visit: Payer: Self-pay

## 2021-08-03 DIAGNOSIS — K50919 Crohn's disease, unspecified, with unspecified complications: Secondary | ICD-10-CM

## 2021-08-03 NOTE — Progress Notes (Signed)
TB gold to [facility]. Last lab got cancelled due being wrong tube draw for this lab

## 2021-08-04 ENCOUNTER — Other Ambulatory Visit: Payer: 59

## 2021-08-04 DIAGNOSIS — K50919 Crohn's disease, unspecified, with unspecified complications: Secondary | ICD-10-CM

## 2021-08-05 ENCOUNTER — Telehealth: Payer: Self-pay | Admitting: Gastroenterology

## 2021-08-05 NOTE — Telephone Encounter (Signed)
Noted  

## 2021-08-05 NOTE — Telephone Encounter (Signed)
Pt called to cancel her appt for Hep B injection today. She states that her pharmacy has one shot only for Hep B versus having two shots with [place]. She will have this done at her pharmacy.

## 2021-08-07 ENCOUNTER — Other Ambulatory Visit: Payer: Self-pay | Admitting: Gastroenterology

## 2021-08-07 LAB — QUANTIFERON-TB GOLD PLUS
Mitogen-NIL: 5.92 IU/mL
NIL: 0.01 IU/mL
QuantiFERON-TB Gold Plus: NEGATIVE
TB1-NIL: 0 IU/mL
TB2-NIL: 0.01 IU/mL

## 2021-08-12 ENCOUNTER — Other Ambulatory Visit: Payer: Self-pay | Admitting: Student

## 2021-08-18 ENCOUNTER — Inpatient Hospital Stay (HOSPITAL_BASED_OUTPATIENT_CLINIC_OR_DEPARTMENT_OTHER): Payer: 59 | Admitting: Hematology & Oncology

## 2021-08-18 ENCOUNTER — Other Ambulatory Visit: Payer: Self-pay

## 2021-08-18 ENCOUNTER — Encounter: Payer: Self-pay | Admitting: Hematology & Oncology

## 2021-08-18 ENCOUNTER — Inpatient Hospital Stay: Payer: 59 | Attending: Hematology & Oncology

## 2021-08-18 VITALS — BP 114/85 | HR 75 | Temp 98.1°F | Resp 16 | Wt 130.0 lb

## 2021-08-18 DIAGNOSIS — K922 Gastrointestinal hemorrhage, unspecified: Secondary | ICD-10-CM | POA: Insufficient documentation

## 2021-08-18 DIAGNOSIS — D500 Iron deficiency anemia secondary to blood loss (chronic): Secondary | ICD-10-CM | POA: Diagnosis not present

## 2021-08-18 DIAGNOSIS — Z87898 Personal history of other specified conditions: Secondary | ICD-10-CM | POA: Diagnosis not present

## 2021-08-18 DIAGNOSIS — K50019 Crohn's disease of small intestine with unspecified complications: Secondary | ICD-10-CM | POA: Diagnosis not present

## 2021-08-18 LAB — CBC WITH DIFFERENTIAL (CANCER CENTER ONLY)
Abs Immature Granulocytes: 0.02 10*3/uL (ref 0.00–0.07)
Basophils Absolute: 0 10*3/uL (ref 0.0–0.1)
Basophils Relative: 1 %
Eosinophils Absolute: 0.1 10*3/uL (ref 0.0–0.5)
Eosinophils Relative: 2 %
HCT: 38.5 % (ref 36.0–46.0)
Hemoglobin: 12.2 g/dL (ref 12.0–15.0)
Immature Granulocytes: 0 %
Lymphocytes Relative: 37 %
Lymphs Abs: 1.7 10*3/uL (ref 0.7–4.0)
MCH: 29.9 pg (ref 26.0–34.0)
MCHC: 31.7 g/dL (ref 30.0–36.0)
MCV: 94.4 fL (ref 80.0–100.0)
Monocytes Absolute: 0.4 10*3/uL (ref 0.1–1.0)
Monocytes Relative: 10 %
Neutro Abs: 2.3 10*3/uL (ref 1.7–7.7)
Neutrophils Relative %: 50 %
Platelet Count: 233 10*3/uL (ref 150–400)
RBC: 4.08 MIL/uL (ref 3.87–5.11)
RDW: 12.9 % (ref 11.5–15.5)
WBC Count: 4.5 10*3/uL (ref 4.0–10.5)
nRBC: 0 % (ref 0.0–0.2)

## 2021-08-18 LAB — RETICULOCYTES
Immature Retic Fract: 9.7 % (ref 2.3–15.9)
RBC.: 4.09 MIL/uL (ref 3.87–5.11)
Retic Count, Absolute: 43.8 10*3/uL (ref 19.0–186.0)
Retic Ct Pct: 1.1 % (ref 0.4–3.1)

## 2021-08-18 NOTE — Progress Notes (Signed)
Hematology and Oncology Follow Up Visit  Carolyn Chaney [id number] 06/05/1961 60 y.o. 08/18/2021   Principle Diagnosis:  Iron deficiency anemia Ductal Hyperplasia of the RIGHT  Crohn's Disease        Current Therapy:  IV iron as indicated  -- venofer given on 04/12/2021 Right breast lumpectomy with radioactive seed localization on 10/09/2019   Interim History:  Carolyn Chaney is here today for follow-up.  Sounds like she now has her Crohn's disease.  She said that she had surgery in July.  This is laparoscopic surgery.  She had obstruction.  This appeared to help for a little bit.  She now is to be receiving either Remicade or Entyvio.  She is being followed by Dr. Bryan Lemma.  Her last iron studies back in June showed a ferritin of 80 with an iron saturation of 28%.  She is watching what she eats.  She is not happy about having Crohn's disease.  This seems to be affecting her life in many areas.  She recently was at the beach.  She really loves to go to the beach.  It would be nice for her to go back to the beach as soon as possible.  She has had no obvious bleeding.  There is no change in bowel or bladder habits.  She has had no issues with cough or shortness of breath.  She has had no problems with fever.  Thankfully, there is been no issues with COVID.  Currently, her performance status is ECOG 1.     Medications:  Allergies as of 08/18/2021       Reactions   Nsaids Other (See Comments)   Bleeding   Other    Laxatives and fleet emema as well and antihistamines   Venofer [iron Sucrose] Other (See Comments)   Syncope, Numbness and tingling in arms and hands.   Zofran [ondansetron]    malice   Zithromax [azithromycin]         Medication List        Accurate as of August 18, 2021  1:34 PM. If you have any questions, ask your nurse or doctor.          acetaminophen 500 MG tablet Commonly known as: TYLENOL Take 500 mg by mouth every 6 (six) hours as needed for  mild pain.   ALPRAZolam 0.5 MG tablet Commonly known as: XANAX Take 0.25 mg by mouth daily as needed for anxiety.   ascorbic acid 500 MG tablet Commonly known as: VITAMIN C Take 500 mg by mouth daily as needed.   budesonide 3 MG 24 hr capsule Commonly known as: ENTOCORT EC TAKE 3 CAPSULES (9 MG TOTAL) BY MOUTH DAILY.   cholecalciferol 1000 units tablet Commonly known as: VITAMIN D Take 1,000 Units by mouth daily as needed.   metoprolol succinate 25 MG 24 hr tablet Commonly known as: TOPROL-XL Take 1 tablet (25 mg total) by mouth daily.   pantoprazole 20 MG tablet Commonly known as: PROTONIX Take 1 tablet (20 mg total) by mouth daily.   sertraline 100 MG tablet Commonly known as: ZOLOFT Take 1.5 tablets by mouth daily.   vitamin B-12 1000 MCG tablet Commonly known as: CYANOCOBALAMIN Take 1,000 mcg by mouth daily as needed.        Allergies:  Allergies  Allergen Reactions   Nsaids Other (See Comments)    Bleeding    Other     Laxatives and fleet emema as well and antihistamines   Venofer [Iron Sucrose] Other (  See Comments)    Syncope, Numbness and tingling in arms and hands.   Zofran [Ondansetron]     malice   Zithromax [Azithromycin]     Past Medical History, Surgical history, Social history, and Family History were reviewed and updated.  Review of Systems: Review of Systems  Constitutional:  Positive for weight loss.  HENT: Negative.    Eyes: Negative.   Respiratory: Negative.    Cardiovascular: Negative.   Gastrointestinal:  Positive for abdominal pain and nausea.  Genitourinary: Negative.   Musculoskeletal: Negative.   Skin: Negative.   Neurological: Negative.   Endo/Heme/Allergies: Negative.   Psychiatric/Behavioral: Negative.      Physical Exam:  weight is 130 lb (59 kg). Her oral temperature is 98.1 F (36.7 C). Her blood pressure is 114/85 and her pulse is 75. Her respiration is 16.   Wt Readings from Last 3 Encounters:  08/18/21 130  lb (59 kg)  07/27/21 136 lb (61.7 kg)  05/21/21 138 lb 3.2 oz (62.7 kg)    Physical Exam Vitals reviewed.  HENT:     Head: Normocephalic and atraumatic.  Eyes:     Pupils: Pupils are equal, round, and reactive to light.  Cardiovascular:     Rate and Rhythm: Normal rate and regular rhythm.     Heart sounds: Normal heart sounds.  Pulmonary:     Effort: Pulmonary effort is normal.     Breath sounds: Normal breath sounds.  Abdominal:     General: Bowel sounds are normal.     Palpations: Abdomen is soft.     Comments: Abdominal exam is soft.  Bowel sounds are present.  She has a healed laparoscopy scar just inferior to the umbilicus.  There is some tenderness above the umbilicus to palpation.  Again there is no fluid wave.  There is no palpable liver or spleen tip.  Musculoskeletal:        General: No tenderness or deformity. Normal range of motion.     Cervical back: Normal range of motion.  Lymphadenopathy:     Cervical: No cervical adenopathy.  Skin:    General: Skin is warm and dry.     Findings: No erythema or rash.  Neurological:     Mental Status: She is alert and oriented to person, place, and time.  Psychiatric:        Behavior: Behavior normal.        Thought Content: Thought content normal.        Judgment: Judgment normal.    Lab Results  Component Value Date   WBC 4.5 08/18/2021   HGB 12.2 08/18/2021   HCT 38.5 08/18/2021   MCV 94.4 08/18/2021   PLT 233 08/18/2021   Lab Results  Component Value Date   FERRITIN 80 05/12/2021   IRON 81 05/12/2021   TIBC 283 05/12/2021   UIBC 202 05/12/2021   IRONPCTSAT 28 05/12/2021   Lab Results  Component Value Date   RETICCTPCT 1.1 08/18/2021   RBC 4.09 08/18/2021   RBC 4.08 08/18/2021   RETICCTABS 29.25 (L) 03/08/2017   No results found for: KPAFRELGTCHN, LAMBDASER, KAPLAMBRATIO No results found for: IGGSERUM, IGA, IGMSERUM No results found for: Ronnald Ramp, A1GS, A2GS, Tillman Sers,  SPEI   Chemistry      Component Value Date/Time   NA 142 04/07/2021 0321   K 3.4 (L) 04/07/2021 0321   CL 109 04/07/2021 0321   CO2 24 04/07/2021 0321   BUN 9 04/07/2021 0321   CREATININE  0.69 04/07/2021 0321   CREATININE 1.14 (H) 07/23/2020 1353      Component Value Date/Time   CALCIUM 8.9 04/07/2021 0321   ALKPHOS 56 04/07/2021 0321   AST 13 (L) 04/07/2021 0321   AST 14 (L) 07/23/2020 1353   ALT 15 04/07/2021 0321   ALT 15 07/23/2020 1353   BILITOT 0.4 04/07/2021 0321   BILITOT 0.3 07/23/2020 1353       Impression and Plan: Carolyn Chaney is a very pleasant 60 yo caucasian female with history of iron deficiency anemia secondary to intermittent GI blood loss as well as ductal hyperplasia of the right breast last fall treated with lumpectomy.   Looks like the GI blood loss might be from Crohn's disease.  Again she is going to be placed on a biologic agent.  She is not sure which one she is going to get yet.  I would think that her iron studies should be okay.  Her hemoglobin is normal.  MCV keeps trending upward.  At this point, I will just have her come back if she needs to have lab work done.  I just would hate to have to have her come back and we really cannot help her out.  As always it is so much fun talking to her.  I have known her for quite a while as we took care of her mom who has small cell lung cancer probably 12 years ago.    [name], MD 9/7/20221:34 PM

## 2021-08-19 ENCOUNTER — Telehealth: Payer: Self-pay

## 2021-08-19 LAB — FERRITIN: Ferritin: 10 ng/mL — ABNORMAL LOW (ref 11–307)

## 2021-08-19 LAB — IRON AND TIBC
Iron: 77 ug/dL (ref 41–142)
Saturation Ratios: 20 % — ABNORMAL LOW (ref 21–57)
TIBC: 376 ug/dL (ref 236–444)
UIBC: 300 ug/dL (ref 120–384)

## 2021-08-19 NOTE — Telephone Encounter (Signed)
08/18/21 chart note states:   At this point, I will just have her come back if she needs to have lab work done.  I just would hate to have to have her come back and we really cannot help her out.  No  08/18/21 LOS noted  Carolyn Chaney

## 2021-08-19 NOTE — Telephone Encounter (Signed)
Called and informed patient of lab results, patient verbalized understanding and denies any questions or concerns at this time.   

## 2021-08-19 NOTE — Telephone Encounter (Signed)
-----   Message from [name], MD sent at 08/19/2021 11:11 AM EDT ----- Call and please tell her that the iron is on the borderline of being low.  We can certainly hold off on any iron for right now since she may have had problems with iron last time.  Carolyn Chaney

## 2021-08-20 ENCOUNTER — Telehealth: Payer: Self-pay | Admitting: Gastroenterology

## 2021-08-20 NOTE — Telephone Encounter (Signed)
Patient called requesting to speak with a nurse in regards to possible [place].

## 2021-08-20 NOTE — Telephone Encounter (Signed)
Lm on vm for patient to return call 

## 2021-08-20 NOTE — Telephone Encounter (Signed)
Received a voicemail from patient stating that she thinks she has thrush. She states that she went to a doctors appt with her sister and a nurse looked at her and stated that's what she thinks she has. Pt states that she has been dealing with this for about 1 week. Patient states that she has been having a sore throat and the back of her tongue hurts all of the way down her throat. She states that she has been doing research and wondered if she had Crohn's in her throat but it is very rare. She is sure that she has thrush and wanted to know if a Nystatin swish and swallow could be sent to her pharmacy ([facility] in [place]). Please advise, thanks

## 2021-08-23 MED ORDER — NYSTATIN 100000 UNIT/ML MT SUSP: OROMUCOSAL | 0 refills | Status: DC

## 2021-08-23 NOTE — Telephone Encounter (Signed)
Nystatin oral suspension 400,000 U swish and swallow qid for 7 days, no refills If symptoms not completely resolved in 7 days see your PCP for evaluation

## 2021-08-23 NOTE — Telephone Encounter (Signed)
Patient is calling to follow up on status from previous call.

## 2021-08-23 NOTE — Telephone Encounter (Signed)
Spoke with patient in regards to recommendations. Patient verbalized understanding of all information.

## 2021-08-23 NOTE — Telephone Encounter (Signed)
Dr. Fuller Plan as DOD PM of 08/23/21 - Please advise, thanks.   [place] patient with a history of Crohn's, IDA, GERD, stricture of intestine.   Please see previous note below. Pt thinks she has thrush and is wanting to know if we can send something in for her. Thanks

## 2021-08-26 NOTE — Telephone Encounter (Signed)
Based on the clinical presentation and the pathology from the surgical resection, this all appears to be very highly suspicious for Crohn's Disease.  While there is the possibility that this is all chronic medication induced inflammation, this seems quite doubtful since the patient has not been using NSAIDs for years and still had active and chronic inflammatory changes on the surgical resection pathology.  Again, those types of changes should allude to Crohn's Disease.  There are genetic lab markers that we sometimes use with IBD, but these carry their own inherent flaws and do not think they would be particularly beneficial in this case.  While we like to be 100% about a diagnosis before starting any medication, sometimes cases like this have some confounding information, which is part of the reason that we discussed this at the IBD multidisciplinary conference, and all were in agreement that this is consistent with Crohn's Disease.

## 2021-08-26 NOTE — Telephone Encounter (Signed)
To answer the second question, previous upper endoscopy with duodenal biopsies in 11/2020 was negative for Celiac Disease.  Pathology is the gold standard test for diagnosing Celiac Disease.

## 2021-09-02 ENCOUNTER — Ambulatory Visit: Payer: 59

## 2021-09-02 ENCOUNTER — Telehealth: Payer: Self-pay | Admitting: Gastroenterology

## 2021-09-02 NOTE — Telephone Encounter (Signed)
I am happy to schedule her another appointment with me to go through [facility] and Crohn's Disease in detail.  We discussed these medications at the last appointment, but I completely understand how confusing these medications can be.  Once she starts Entyvio and completes the induction phase, we plan to continue long-term with maintenance infusions every 8 weeks.  We can continue to monitor through noninvasive testing such as labs and imaging, with optimal timing for colonoscopy typically 12 months after starting Entyvio.  Additionally, I find UpdateRate.fr to be a very reliable website which is monitored by IBD field experts.  This provides great information on not only Crohn's Disease and Ulcerative Colitis, but also the many medications that are available.

## 2021-09-02 NOTE — Telephone Encounter (Signed)
Pt sent my chart message as well. Information has been relayed to patient via my chart. See 07/29/21 patient message.

## 2021-09-02 NOTE — Telephone Encounter (Signed)
Can start weaning the budesonide when she starts the [facility].

## 2021-09-02 NOTE — Telephone Encounter (Signed)
Patient called would like to discuss some things with Dr. Bryan Lemma she said she has little to no information about the [facility] she is to start has an appointment for today but will reschedule it for 09/21/2021. She also stated she is on Budesonide medication for about 6 weeks now and it seems to be doing well with it. She has a lot of question regarding the [facility] Rodney is not sure if once she starts it can she stop it or would she need to go through the whole course of it. Seeking direction is feeling like she is by herself in this treatment journey.

## 2021-09-06 ENCOUNTER — Other Ambulatory Visit: Payer: Self-pay | Admitting: Gastroenterology

## 2021-09-13 ENCOUNTER — Other Ambulatory Visit: Payer: Self-pay | Admitting: Student

## 2021-09-14 ENCOUNTER — Other Ambulatory Visit: Payer: Self-pay | Admitting: Family

## 2021-09-14 ENCOUNTER — Telehealth: Payer: Self-pay

## 2021-09-14 NOTE — Telephone Encounter (Signed)
Pt called requesting to have iron infusion. States she feels symptomatic now. States she cant have venofer as she reacted last time. Gillian Shields to put in orders in once approval from insurance is completed and patient is aware scheduling will call once approved.

## 2021-09-15 ENCOUNTER — Telehealth: Payer: Self-pay | Admitting: Family

## 2021-09-15 ENCOUNTER — Other Ambulatory Visit: Payer: Self-pay | Admitting: Family

## 2021-09-15 NOTE — Telephone Encounter (Signed)
Called patient and LMVM for her about appointment added to her schedule per 10/5 staff message

## 2021-09-17 ENCOUNTER — Ambulatory Visit: Payer: 59

## 2021-09-21 ENCOUNTER — Telehealth: Payer: Self-pay | Admitting: Pharmacy Technician

## 2021-09-21 ENCOUNTER — Ambulatory Visit: Payer: 59

## 2021-09-21 NOTE — Telephone Encounter (Signed)
FYI NOTE:  Patient would like to speak with MD at her next scheduled appt 10/14/21 before starting Entyvio.  Entyvio approval date: 07/28/21 - 11/03/21  Will wait to schedule patient.

## 2021-10-05 ENCOUNTER — Telehealth: Payer: Self-pay | Admitting: *Deleted

## 2021-10-05 NOTE — Telephone Encounter (Signed)
Per staff message - called and gave upcoming appointments - confirmed -  (1) dose of IV Iron (Feraheme)

## 2021-10-07 ENCOUNTER — Telehealth: Payer: Self-pay | Admitting: *Deleted

## 2021-10-07 NOTE — Telephone Encounter (Signed)
Per 10/07/21 staff message Dr. Marin Olp - called and gave upcoming appointments - confirmed

## 2021-10-08 ENCOUNTER — Ambulatory Visit: Payer: 59

## 2021-10-14 ENCOUNTER — Other Ambulatory Visit: Payer: Self-pay

## 2021-10-14 ENCOUNTER — Ambulatory Visit (INDEPENDENT_AMBULATORY_CARE_PROVIDER_SITE_OTHER): Payer: 59 | Admitting: Gastroenterology

## 2021-10-14 ENCOUNTER — Other Ambulatory Visit (INDEPENDENT_AMBULATORY_CARE_PROVIDER_SITE_OTHER): Payer: 59

## 2021-10-14 ENCOUNTER — Ambulatory Visit: Payer: 59 | Admitting: Gastroenterology

## 2021-10-14 ENCOUNTER — Encounter: Payer: Self-pay | Admitting: Gastroenterology

## 2021-10-14 VITALS — BP 130/82 | HR 80 | Ht 65.5 in | Wt 129.1 lb

## 2021-10-14 DIAGNOSIS — D509 Iron deficiency anemia, unspecified: Secondary | ICD-10-CM | POA: Diagnosis not present

## 2021-10-14 DIAGNOSIS — K50919 Crohn's disease, unspecified, with unspecified complications: Secondary | ICD-10-CM

## 2021-10-14 DIAGNOSIS — K2100 Gastro-esophageal reflux disease with esophagitis, without bleeding: Secondary | ICD-10-CM | POA: Diagnosis not present

## 2021-10-14 DIAGNOSIS — K56699 Other intestinal obstruction unspecified as to partial versus complete obstruction: Secondary | ICD-10-CM

## 2021-10-14 NOTE — Patient Instructions (Signed)
If you are age 60 or older, your body mass index should be between 23-30. Your Body mass index is 21.16 kg/m. If this is out of the aforementioned range listed, please consider follow up with your Primary Care Provider.  If you are age 74 or younger, your body mass index should be between 19-25. Your Body mass index is 21.16 kg/m. If this is out of the aformentioned range listed, please consider follow up with your Primary Care Provider.   __________________________________________________________  The [facility] providers would like to encourage you to use [facility] to communicate with providers for non-urgent requests or questions.  Due to long hold times on the telephone, sending your provider a message by [facility] may be a faster and more efficient way to get a response.  Please allow 48 business hours for a response.  Please remember that this is for non-urgent requests.    Please go to the lab on the 2nd floor [address] before you leave the office today.   Due to recent changes in healthcare laws, you may see the results of your imaging and laboratory studies on MyChart before your provider has had a chance to review them.  We understand that in some cases there may be results that are confusing or concerning to you. Not all laboratory results come back in the same time frame and the provider may be waiting for multiple results in order to interpret others.  Please give [place] 48 hours in order for your provider to thoroughly review all the results before contacting the office for clarification of your results.    Thank you for choosing me and [facility].  Vito Cirigliano, D.O.

## 2021-10-14 NOTE — Progress Notes (Signed)
Chief Complaint:    Crohn's Disease  GI History: 60 year old female initially seen in the GI clinic 09/16/2020 for evaluation of hematochezia and IDA.  History of IDA dating back to 09/2011 with intermittent IV iron infusions over the years.  EGD/colonoscopy completed in 02/2016 for evaluation of IDA as outlined below.     Endoscopic/GI Evaluation history: -Per patient reports history of NSAID induced ulcers with bowel obstruction 2/2 stricture in ~1998. Ex-lap with appendectomy performed. UGI series at that time n/f SB obstruction per patient, and treated with NSAID cessation and conservative management. She had extensive w/u for migraines, but eventuallly resorted to NSAIDs again as the only effective tx. -Colonoscopy (11/2002): Normal except for erosion in the cecum.  Random biopsies demonstrate active colitis without chronic changes -EGD (2007): Normal including normal small bowel biopsies -Colonoscopy (01/15/2010): Normal.  Rectal hyperplastic polyps.  Repeat 10 years -09/16/2011: Ferritin 6 -CT enterography (09/2011): Distal ileum with several short segments of bowel wall thickening and mucosal enhancement (ileitis).  No obstruction -EGD (02/16/2016, Dr. Howell Rucks): Normal.  Biopsies negative for celiac -Colonoscopy (02/16/2016, Dr. Howell Rucks): Shallow erosion at IC valve (biopsy: Nonspecific ulcer), Otherwise normal colon.  Unable to intubate IC valve and examined terminal ileum - 07/2020: Ferritin <4, iron 29, TIBC 474, sat 6%.  H/H 8.8/29.9 with MCV/RDW 86/30.7. -Colonoscopy (11/12/2020, Dr. Bryan Lemma): nonbleeding grade 1 internal hemorrhoids, moderate stenosis of the ileocecal valve which was eventually traversed, second stenosis approximately 2 cm proximal to the ICV which could not be traversed, visualized mucosa in the terminal ileum was erythematous and with ulcer x2; pathology showed no active or chronic inflammatory changes to support or suggest Crohn's disease - EGD (11/12/2020,  Dr. Bryan Lemma): LA grade A reflux esophagitis and gastritis; placed on pantoprazole 40 twice a day and Carafate; pathology showed mild gastritis with no evidence of H. pylori - Hospital admission 03/2021 for small bowel obstruction - CT (04/04/2021): Fluid-filled mildly dilated distal small bowel with mucosal thickening and stricturing at the terminal ileum suggesting partial obstruction at this location.  Possible duodenal ulcer - MR Enterography (04/07/2021): Persistent wall thickening and enhancement of the distal and terminal ileum.  Suspect stricture narrowing likely from chronic inflammation (chronic underlying scarring change with fibrosis) plus active inflammation causing combination of mild partial functional mechanical obstruction. - 04/2021: IV iron - 04/20/2021: GI follow-up: Was feeling better.  Reports only taking the budesonide 9 mg/day for couple days, then reduce to 6 mg/day for a couple days, and was at 3 mg/day at that time due to concerns about medication induced symptoms. - 05/05/2021: Case discussed at GI multidisciplinary conference.  Higher suspicion for stricturing Crohn's Disease rather than NSAID induced since she has very rare NSAID use since age 32 and none in the last 10-12 years.  Imaging with 10+ centimeter stricture in the distal ileum with some superimposed inflammatory changes.  Recommended trial of prednisone but patient declined. - Evaluated by Dr. Hester Mates at [facility]. - 05/26/2021: Underwent right hemicolectomy and 16 cm of ileal resection at [facility].  Pathology suspicious for Crohn's Disease. - 07/27/2021: Follow-up in GI clinic.  Had mild flare approximately 6 weeks post op.  Had restarted budesonide 4.5 mg/day with improvement.     Separately, reflux well controlled since reducing Protonix to 20 mg/day.  HPI:     Patient is a 60 y.o. female presenting to the Gastroenterology Clinic for follow-up.  Last seen by me on 07/27/2021.  At that time was overall  feeling well  s/p right hemicolectomy and distal ileal resection in 05/2021 at [facility].  We discussed biologic therapy, and she decided to initiate Entyvio.  Has since completed budesonide 6 mg x 6 weeks and transitioned 4.5 mg over the last 5 weeks or so.  Symptoms well controlled.  Tolerating all p.o. intake.  Normal bowel habits.  She has since canceled her Entyvio infusion x2, as she is very worried about starting a biologic and the potential ADR profile.  She has read through the data on [email or web address], but is quite anxious about starting biologic therapy and presents to the office today to discuss treatment options again.  Does report her BP has been high on home readings lately, which she reports is related to underlying anxiety/stress related to her Crohn's therapy. Has f/u scheduled with her PCM.    Review of systems:     No chest pain, no SOB, no fevers, no urinary sx   Past Medical History:  Diagnosis Date   Anxiety    Breast mass, right    Chronic GI bleeding 8/54/6270   Complication of anesthesia    Depression    Dysrhythmia    PVC,s, atrial tachycardia   Gastric ulcer    Hiatal hernia    Iron deficiency anemia 11/07/2011   Irregular heart rate    PONV (postoperative nausea and vomiting)    SBO (small bowel obstruction) ([place])    in 2 different places per patient     Patient's surgical history, family medical history, social history, medications and allergies were all reviewed in Epic    Current Outpatient Medications  Medication Sig Dispense Refill   acetaminophen (TYLENOL) 500 MG tablet Take 500 mg by mouth every 6 (six) hours as needed for mild pain.     ALPRAZolam (XANAX) 0.5 MG tablet Take 0.25 mg by mouth daily as needed for anxiety.      ascorbic acid (VITAMIN C) 500 MG tablet Take 500 mg by mouth daily as needed.      budesonide (ENTOCORT EC) 3 MG 24 hr capsule TAKE 3 CAPSULES (9 MG TOTAL) BY MOUTH DAILY. (Patient taking differently: Take 6 mg by mouth daily. Took 6  mg for 6 weeks, taking 4.5 mg for 5 weeks) 270 capsule 0   cholecalciferol (VITAMIN D) 1000 units tablet Take 1,000 Units by mouth daily as needed.      metoprolol succinate (TOPROL-XL) 25 MG 24 hr tablet TAKE 1 TABLET BY MOUTH EVERY DAY 90 tablet 2   pantoprazole (PROTONIX) 20 MG tablet TAKE 1 TABLET BY MOUTH EVERY DAY 90 tablet 1   vitamin B-12 (CYANOCOBALAMIN) 1000 MCG tablet Take 1,000 mcg by mouth daily as needed.      No current facility-administered medications for this visit.    Physical Exam:     BP 130/82   Pulse 80   Ht 5' 5.5" (1.664 m)   Wt 129 lb 2 oz (58.6 kg)   SpO2 98%   BMI 21.16 kg/m   GENERAL:  Pleasant female in NAD PSYCH: : Cooperative, normal affect Musculoskeletal:  Normal muscle tone, normal strength NEURO: Alert and oriented x 3, no focal neurologic deficits   IMPRESSION and PLAN:    1) Crohn's Disease 2) History of right hemicolectomy and distal ileal resection 60 year old female with stricturing type ileal Crohn's Disease.  Diagnosis established at the time of ileal resection for chronic, obstructing stricture.  No prior evidence of colonic disease.  She is quite apprehensive about starting Entyvio or any other biologic or  immunomodulators agent.  She is quite fearful of the medication side effect profile.  We discussed the potential adverse events of uncontrolled Crohn's Disease and percent risk of postoperative recurrence.  We discussed guideline recommendations and available current literature.  However, as she is quite fearful of the medication (becomes tearful when discussing the potential drug side effects). I have concerns that the unintended side effects of anxiety, stress, fear, etc. outweigh the potential benefit.  Will instead plan as below:  - Hold off on initiating Entyvio at this juncture - Continue budesonide 4.5 mg for now with plan to wean to 3 mg/day and eventually wean off - Check ESR, CRP, fecal calprotectin now as a surrogate marker  of potential active inflammation.  Will use these noninvasive markers every 3 months.  If these start to elevate or she starts to develop Crohn's symptoms as she weans her budesonide, she agrees that it would then be in her best interest to start a biologic agent (+/- repeating CT enterography and/or repeat colonoscopy depending on lab markers and symptoms)  3) Iron deficiency anemia - Continue follow-up in the Hematology clinic as scheduled  4) GERD with erosive esophagitis - Controlled on current therapy   - RTC in 3-4 months or sooner prn  I spent 35 minutes of time, including in depth chart review, independent review of results as outlined above, communicating results with the patient directly, face-to-face time with the patient, coordinating care, and ordering studies and medications as appropriate, and documentation.      Lavena Bullion ,DO, FACG 10/14/2021, 2:02 PM

## 2021-10-15 ENCOUNTER — Other Ambulatory Visit: Payer: Self-pay

## 2021-10-15 ENCOUNTER — Other Ambulatory Visit: Payer: 59

## 2021-10-15 DIAGNOSIS — K50919 Crohn's disease, unspecified, with unspecified complications: Secondary | ICD-10-CM

## 2021-10-15 LAB — C-REACTIVE PROTEIN: CRP: <1 mg/dL (ref 0.5–20.0)

## 2021-10-15 LAB — SEDIMENTATION RATE: Sed Rate: 8 mm/hr (ref 0–30)

## 2021-10-18 LAB — CALPROTECTIN, FECAL: Calprotectin, Fecal: 48 ug/g (ref 0–120)

## 2021-10-21 ENCOUNTER — Telehealth: Payer: Self-pay | Admitting: General Surgery

## 2021-10-21 DIAGNOSIS — K50919 Crohn's disease, unspecified, with unspecified complications: Secondary | ICD-10-CM

## 2021-10-21 DIAGNOSIS — K922 Gastrointestinal hemorrhage, unspecified: Secondary | ICD-10-CM

## 2021-10-21 NOTE — Telephone Encounter (Signed)
-----  Message from Bearden, DO sent at 10/18/2021  6:00 PM EST ----- Similarly fecal calprotectin is also normal.  Along with the normal ESR/CRP, this indicates no active inflammation at this time.  Plan to recheck in 3 months for continued monitoring.

## 2021-10-21 NOTE — Telephone Encounter (Signed)
Left patient a detailed voicemail message that her labs and stool study were normal and that we need to check these  labs again in 3 moths. Sent patient a Pharmacist, community message as well.

## 2021-10-26 ENCOUNTER — Inpatient Hospital Stay: Payer: 59 | Attending: Hematology & Oncology

## 2021-10-26 ENCOUNTER — Other Ambulatory Visit: Payer: Self-pay

## 2021-10-26 DIAGNOSIS — D500 Iron deficiency anemia secondary to blood loss (chronic): Secondary | ICD-10-CM | POA: Diagnosis present

## 2021-10-26 DIAGNOSIS — K922 Gastrointestinal hemorrhage, unspecified: Secondary | ICD-10-CM | POA: Insufficient documentation

## 2021-10-26 LAB — CMP (CANCER CENTER ONLY)
ALT: 17 U/L (ref 0–44)
AST: 15 U/L (ref 15–41)
Albumin: 4.3 g/dL (ref 3.5–5.0)
Alkaline Phosphatase: 60 U/L (ref 38–126)
Anion gap: 7 (ref 5–15)
BUN: 16 mg/dL (ref 6–20)
CO2: 30 mmol/L (ref 22–32)
Calcium: 9.7 mg/dL (ref 8.9–10.3)
Chloride: 102 mmol/L (ref 98–111)
Creatinine: 0.81 mg/dL (ref 0.44–1.00)
GFR, Estimated: >60 mL/min (ref >60)
Glucose, Bld: 131 mg/dL — ABNORMAL HIGH (ref 70–99)
Potassium: 4.6 mmol/L (ref 3.5–5.1)
Sodium: 139 mmol/L (ref 135–145)
Total Bilirubin: 0.3 mg/dL (ref 0.3–1.2)
Total Protein: 6.7 g/dL (ref 6.5–8.1)

## 2021-10-26 LAB — CBC WITH DIFFERENTIAL (CANCER CENTER ONLY)
Abs Immature Granulocytes: 0.17 10*3/uL — ABNORMAL HIGH (ref 0.00–0.07)
Basophils Absolute: 0 10*3/uL (ref 0.0–0.1)
Basophils Relative: 0 %
Eosinophils Absolute: 0 10*3/uL (ref 0.0–0.5)
Eosinophils Relative: 0 %
HCT: 35.1 % — ABNORMAL LOW (ref 36.0–46.0)
Hemoglobin: 11.1 g/dL — ABNORMAL LOW (ref 12.0–15.0)
Immature Granulocytes: 2 %
Lymphocytes Relative: 13 %
Lymphs Abs: 1.3 10*3/uL (ref 0.7–4.0)
MCH: 29.8 pg (ref 26.0–34.0)
MCHC: 31.6 g/dL (ref 30.0–36.0)
MCV: 94.1 fL (ref 80.0–100.0)
Monocytes Absolute: 0.5 10*3/uL (ref 0.1–1.0)
Monocytes Relative: 5 %
Neutro Abs: 8 10*3/uL — ABNORMAL HIGH (ref 1.7–7.7)
Neutrophils Relative %: 80 %
Platelet Count: 241 10*3/uL (ref 150–400)
RBC: 3.73 MIL/uL — ABNORMAL LOW (ref 3.87–5.11)
RDW: 13.6 % (ref 11.5–15.5)
WBC Count: 10.1 10*3/uL (ref 4.0–10.5)
nRBC: 0 % (ref 0.0–0.2)

## 2021-10-26 LAB — RETICULOCYTES
Immature Retic Fract: 14 % (ref 2.3–15.9)
RBC.: 3.71 MIL/uL — ABNORMAL LOW (ref 3.87–5.11)
Retic Count, Absolute: 42.7 10*3/uL (ref 19.0–186.0)
Retic Ct Pct: 1.2 % (ref 0.4–3.1)

## 2021-10-27 LAB — IRON AND TIBC
Iron: 65 ug/dL (ref 41–142)
Saturation Ratios: 17 % — ABNORMAL LOW (ref 21–57)
TIBC: 378 ug/dL (ref 236–444)
UIBC: 313 ug/dL (ref 120–384)

## 2021-10-27 LAB — FERRITIN: Ferritin: 11 ng/mL (ref 11–307)

## 2021-11-09 ENCOUNTER — Other Ambulatory Visit: Payer: Self-pay

## 2021-11-09 DIAGNOSIS — D500 Iron deficiency anemia secondary to blood loss (chronic): Secondary | ICD-10-CM

## 2021-11-10 ENCOUNTER — Encounter: Payer: Self-pay | Admitting: Hematology & Oncology

## 2021-11-10 ENCOUNTER — Telehealth: Payer: Self-pay | Admitting: Hematology & Oncology

## 2021-11-10 ENCOUNTER — Other Ambulatory Visit: Payer: Self-pay

## 2021-11-10 ENCOUNTER — Inpatient Hospital Stay (HOSPITAL_BASED_OUTPATIENT_CLINIC_OR_DEPARTMENT_OTHER): Payer: 59 | Admitting: Hematology & Oncology

## 2021-11-10 ENCOUNTER — Inpatient Hospital Stay: Payer: 59

## 2021-11-10 VITALS — BP 119/72 | HR 79 | Temp 98.6°F | Resp 20 | Wt 130.0 lb

## 2021-11-10 DIAGNOSIS — D500 Iron deficiency anemia secondary to blood loss (chronic): Secondary | ICD-10-CM

## 2021-11-10 NOTE — Progress Notes (Signed)
Hematology and Oncology Follow Up Visit  Carolyn Chaney [id number] 1960-12-22 61 y.o. 11/10/2021   Principle Diagnosis:  Iron deficiency anemia Ductal Hyperplasia of the RIGHT  Crohn's Disease        Current Therapy:  IV iron as indicated  -- Monoferric -- allergic to Venofer/Ferrlicit/Feraheme Right breast lumpectomy with radioactive seed localization on 10/09/2019   Interim History:  Carolyn Chaney is here today for follow-up.  Unfortunately, she still having problems with her iron.  She does have Crohn's disease.  She thankfully has not had a flareup of this.  She has surgery back in July for a bowel obstruction.  Her iron is low again.  Again I suspect that she is pregnant need IV iron.  Last time we gave her IV iron, she had a severe reaction.  She cannot tolerate Venofer or Ferrlecit or Feraheme.  As such, I think Monoferric is the only way that we can go with her.  We will clearly need to premedicate her for Monoferric.  She feels tired.  She made it through Thanksgiving although this was stressful for her.  She has had no issues with nausea or vomiting.  She has had no leg swelling.  There has been no rashes.  Currently, I would have to say performance status is probably ECOG 1.       Medications:  Allergies as of 11/10/2021       Reactions   Nsaids Other (See Comments)   Bleeding   Other    Laxatives and fleet emema as well and antihistamines   Venofer [iron Sucrose] Other (See Comments)   Syncope, Numbness and tingling in arms and hands.   Zofran [ondansetron]    malice   Zithromax [azithromycin]         Medication List        Accurate as of November 10, 2021  1:42 PM. If you have any questions, ask your nurse or doctor.          STOP taking these medications    pantoprazole 20 MG tablet Commonly known as: PROTONIX Stopped by: [name], MD       TAKE these medications    acetaminophen 500 MG tablet Commonly known as: TYLENOL Take  500 mg by mouth every 6 (six) hours as needed for mild pain.   ALPRAZolam 0.5 MG tablet Commonly known as: XANAX Take 0.25 mg by mouth daily as needed for anxiety.   ascorbic acid 500 MG tablet Commonly known as: VITAMIN C Take 500 mg by mouth daily as needed.   budesonide 3 MG 24 hr capsule Commonly known as: ENTOCORT EC TAKE 3 CAPSULES (9 MG TOTAL) BY MOUTH DAILY. What changed:  how much to take additional instructions   cholecalciferol 1000 units tablet Commonly known as: VITAMIN D Take 1,000 Units by mouth daily as needed.   cyclobenzaprine 10 MG tablet Commonly known as: FLEXERIL Take 5-10 mg by mouth 3 (three) times daily.   eszopiclone 2 MG Tabs tablet Commonly known as: LUNESTA Take 2 mg by mouth at bedtime as needed.   metoprolol succinate 25 MG 24 hr tablet Commonly known as: TOPROL-XL TAKE 1 TABLET BY MOUTH EVERY DAY   vitamin B-12 1000 MCG tablet Commonly known as: CYANOCOBALAMIN Take 1,000 mcg by mouth daily as needed.        Allergies:  Allergies  Allergen Reactions   Nsaids Other (See Comments)    Bleeding    Other     Laxatives and  fleet emema as well and antihistamines   Venofer [Iron Sucrose] Other (See Comments)    Syncope, Numbness and tingling in arms and hands.   Zofran [Ondansetron]     malice   Zithromax [Azithromycin]     Past Medical History, Surgical history, Social history, and Family History were reviewed and updated.  Review of Systems: Review of Systems  Constitutional:  Positive for weight loss.  HENT: Negative.    Eyes: Negative.   Respiratory: Negative.    Cardiovascular: Negative.   Gastrointestinal:  Positive for abdominal pain and nausea.  Genitourinary: Negative.   Musculoskeletal: Negative.   Skin: Negative.   Neurological: Negative.   Endo/Heme/Allergies: Negative.   Psychiatric/Behavioral: Negative.      Physical Exam:  weight is 130 lb (59 kg). Her oral temperature is 98.6 F (37 C). Her blood  pressure is 119/72 and her pulse is 79. Her respiration is 20 and oxygen saturation is 100%.   Wt Readings from Last 3 Encounters:  11/10/21 130 lb (59 kg)  10/14/21 129 lb 2 oz (58.6 kg)  08/18/21 130 lb (59 kg)    Physical Exam Vitals reviewed.  HENT:     Head: Normocephalic and atraumatic.  Eyes:     Pupils: Pupils are equal, round, and reactive to light.  Cardiovascular:     Rate and Rhythm: Normal rate and regular rhythm.     Heart sounds: Normal heart sounds.  Pulmonary:     Effort: Pulmonary effort is normal.     Breath sounds: Normal breath sounds.  Abdominal:     General: Bowel sounds are normal.     Palpations: Abdomen is soft.     Comments: Abdominal exam is soft.  Bowel sounds are present.  She has a healed laparoscopy scar just inferior to the umbilicus.  There is some tenderness above the umbilicus to palpation.  Again there is no fluid wave.  There is no palpable liver or spleen tip.  Musculoskeletal:        General: No tenderness or deformity. Normal range of motion.     Cervical back: Normal range of motion.  Lymphadenopathy:     Cervical: No cervical adenopathy.  Skin:    General: Skin is warm and dry.     Findings: No erythema or rash.  Neurological:     Mental Status: She is alert and oriented to person, place, and time.  Psychiatric:        Behavior: Behavior normal.        Thought Content: Thought content normal.        Judgment: Judgment normal.    Lab Results  Component Value Date   WBC 10.1 10/26/2021   HGB 11.1 (L) 10/26/2021   HCT 35.1 (L) 10/26/2021   MCV 94.1 10/26/2021   PLT 241 10/26/2021   Lab Results  Component Value Date   FERRITIN 11 10/26/2021   IRON 65 10/26/2021   TIBC 378 10/26/2021   UIBC 313 10/26/2021   IRONPCTSAT 17 (L) 10/26/2021   Lab Results  Component Value Date   RETICCTPCT 1.2 10/26/2021   RBC 3.71 (L) 10/26/2021   RETICCTABS 29.25 (L) 03/08/2017   No results found for: KPAFRELGTCHN, LAMBDASER,  KAPLAMBRATIO No results found for: IGGSERUM, IGA, IGMSERUM No results found for: Ronnald Ramp, A1GS, A2GS, Violet Baldy, MSPIKE, SPEI   Chemistry      Component Value Date/Time   NA 139 10/26/2021 1453   K 4.6 10/26/2021 1453   CL 102 10/26/2021 1453  CO2 30 10/26/2021 1453   BUN 16 10/26/2021 1453   CREATININE 0.81 10/26/2021 1453      Component Value Date/Time   CALCIUM 9.7 10/26/2021 1453   ALKPHOS 60 10/26/2021 1453   AST 15 10/26/2021 1453   ALT 17 10/26/2021 1453   BILITOT 0.3 10/26/2021 1453       Impression and Plan: Ms. Galeno is a very pleasant 60 yo caucasian female with history of iron deficiency anemia secondary to intermittent GI blood loss as well as ductal hyperplasia of the right breast last fall treated with lumpectomy.   Looks like the GI blood loss might be from Crohn's disease.  We will try her on Monoferric.  Again I think this will work.  We does have to make sure she is premedicated.  Hopefully, we will get her insurance company to approve the Monoferric.  I would like to plan to get her back after the Christmas holidays.  We will try to do the Monoferric in 1-2 weeks.    [name], MD 11/30/20221:42 PM

## 2021-11-12 ENCOUNTER — Other Ambulatory Visit: Payer: Self-pay | Admitting: Gastroenterology

## 2021-11-12 ENCOUNTER — Other Ambulatory Visit: Payer: Self-pay | Admitting: Pharmacist

## 2021-11-22 ENCOUNTER — Inpatient Hospital Stay: Payer: 59

## 2021-11-23 ENCOUNTER — Encounter: Payer: Self-pay | Admitting: Internal Medicine

## 2021-11-23 ENCOUNTER — Ambulatory Visit: Payer: 59 | Admitting: Pharmacist

## 2021-11-23 ENCOUNTER — Ambulatory Visit: Payer: 59 | Admitting: Internal Medicine

## 2021-11-23 ENCOUNTER — Other Ambulatory Visit: Payer: Self-pay

## 2021-11-23 VITALS — BP 120/60 | HR 74 | Ht 65.5 in | Wt 127.0 lb

## 2021-11-23 DIAGNOSIS — I471 Supraventricular tachycardia: Secondary | ICD-10-CM

## 2021-11-23 NOTE — Progress Notes (Signed)
Patient Care Team: [name], MD as PCP - General (Internal Medicine) Deboraha Sprang, MD as PCP - Cardiology (Cardiology) Deboraha Sprang, MD as PCP - Electrophysiology (Cardiology)   HPI  Carolyn Chaney is a 60 y.o. female seen in follow-up for palpitations associated with atrial tachycardia. She saw Dr. Lovena Le for consideration of ablation and elected to pursue therapy with flecainide--that was forsaken and she now takes low dose metoprolol with great benefit  She had concern about coronary disease and had considered calcium scoring; was never completed.  Records and Results Reviewed hospital records from recent endoscopy undertaken for Fe deficiency anemia in setting of Crohns disease identified 4/22 hospitalized at that time for an obstruction.  Ultimately underwent surgery at [facility] and is currently taking Budesinide  Hyperlipidemia but familial intolerance of statins   Date Cr K Hgb  11/22 0.81 4.6 11.1<<9.6            DATE TEST EF   3/22 CaScore (CE)    % Score=0          Palpitations are problematic.  Mostly however she takes a full metoprolol, and she says it has improved her The patient denies chest pain, shortness of breath, nocturnal dyspnea, orthopnea or peripheral edema.  There have been no palpitations, lightheadedness or syncope.       Father died 02/01/2023.  She had been his caregiver for years and helped him in his daily life.    Her one of her sisters is disabled while her sister takes care of her father's business.          Past Medical History:  Diagnosis Date   Anxiety    Breast mass, right    Chronic GI bleeding 4/65/0354   Complication of anesthesia    Depression    Dysrhythmia    PVC,s, atrial tachycardia   Gastric ulcer    Hiatal hernia    Iron deficiency anemia 11/07/2011   Irregular heart rate    PONV (postoperative nausea and vomiting)    SBO (small bowel obstruction) ([place])    in 2 different places per patient     Past  Surgical History:  Procedure Laterality Date   APPENDECTOMY     BOWEL RESECTION  05/26/2021   BREAST EXCISIONAL BIOPSY Right 1980   Benign   BREAST LUMPECTOMY WITH RADIOACTIVE SEED LOCALIZATION Right 10/09/2019   Procedure: RIGHT BREAST LUMPECTOMY WITH RADIOACTIVE SEED LOCALIZATION;  Surgeon: Fanny Skates, MD;  Location: [facility];  Service: General;  Laterality: Right;   BREAST SURGERY     x2   COLONOSCOPY WITH PROPOFOL N/A 02/16/2016   Procedure: COLONOSCOPY WITH PROPOFOL;  Surgeon: Garlan Fair, MD;  Location: [facility];  Service: Endoscopy;  Laterality: N/A;   ESOPHAGOGASTRODUODENOSCOPY (EGD) WITH PROPOFOL N/A 02/16/2016   Procedure: ESOPHAGOGASTRODUODENOSCOPY (EGD) WITH PROPOFOL;  Surgeon: Garlan Fair, MD;  Location: [facility];  Service: Endoscopy;  Laterality: N/A;   HERNIA REPAIR     bilateral inguinal hernia as child   TONSILLECTOMY      Current Outpatient Medications  Medication Sig Dispense Refill   acetaminophen (TYLENOL) 500 MG tablet Take 500 mg by mouth every 6 (six) hours as needed for mild pain.     ALPRAZolam (XANAX) 0.5 MG tablet Take 0.25 mg by mouth daily as needed for anxiety.      ascorbic acid (VITAMIN C) 500 MG tablet Take 500 mg by mouth daily as needed.  (Patient not  taking: Reported on 11/10/2021)     budesonide (ENTOCORT EC) 3 MG 24 hr capsule Take 2 capsules (6 mg total) by mouth daily. 60 capsule 3   cholecalciferol (VITAMIN D) 1000 units tablet Take 1,000 Units by mouth daily as needed.      cyclobenzaprine (FLEXERIL) 10 MG tablet Take 5-10 mg by mouth 3 (three) times daily. (Patient not taking: Reported on 11/10/2021)     eszopiclone (LUNESTA) 2 MG TABS tablet Take 2 mg by mouth at bedtime as needed. (Patient not taking: Reported on 11/10/2021)     metoprolol succinate (TOPROL-XL) 25 MG 24 hr tablet TAKE 1 TABLET BY MOUTH EVERY DAY 90 tablet 2   vitamin B-12 (CYANOCOBALAMIN) 1000 MCG tablet Take 1,000 mcg by mouth daily as  needed.      No current facility-administered medications for this visit.    Allergies  Allergen Reactions   Nsaids Other (See Comments)    Bleeding    Other     Laxatives and fleet emema as well and antihistamines   Venofer [Iron Sucrose] Other (See Comments)    Syncope, Numbness and tingling in arms and hands.   Zofran [Ondansetron]     malice   Zithromax [Azithromycin]       Review of Systems negative except from HPI and PMH (+) Stress (+) Anemia (+) Headaches (+) Palpitations (+) Anxiety  Physical Exam BP 120/60 (BP Location: Left Arm, Patient Position: Sitting, Cuff Size: Normal)    Pulse 74    Ht 5' 5.5" (1.664 m)    Wt 127 lb (57.6 kg)    SpO2 98%    BMI 20.81 kg/m  Well developed and nourished in no acute distress HENT normal Neck supple with JVP-  flat  Clear Regular rate and rhythm, no murmurs or gallops Abd-soft with active BS No Clubbing cyanosis edema Skin-warm and dry A & Oriented  Grossly normal sensory and motor function      Assessment and  Plan  Atrial tachycardia  Crohns disease  Fe Def anemia   Hypercholestrolemia   Palpitations are largely quiescient.  She continues on metoprolol.  At 25 mg.  We discussed alternative therapies including flecainide for now she would like to avoid other therapies.  She continues to undergo iron repletion.  We will wait till this is done prior to making any further decisions regarding medications  With Ca Score of 0 no indication for therapy   I,Tinashe Williams,acting as a scribe for Virl Axe, MD.,have documented all relevant documentation on the behalf of Virl Axe, MD,as directed by  Virl Axe, MD while in the presence of Virl Axe, MD.   I, Virl Axe, MD, have reviewed all documentation for this visit. The documentation on 11/23/21 for the exam, diagnosis, procedures, and orders are all accurate and complete.

## 2021-11-23 NOTE — Progress Notes (Deleted)
Patient ID: Carolyn Chaney                 DOB: 06/11/1961                    MRN: [id number]     HPI: Carolyn Chaney is a 60 y.o. female patient of Dr Caryl Comes referred to lipid clinic by Oda Kilts, PA. PMH is significant for atrial tachycardia/SVT, HLD, abdominal CT suggestive of partial bowel obstruction and possible Crohn disease, small bowel ulcerations, stricture of small intestine, and anemia. Coronary artery calcium score was 9 on 02/2021 CT. Pt initially referred in June after last office visit, appt was rescheduled until December.  Klein at 1:45, me at 3, no charge visit Reports no one in her family tolerates statins.   Can get NMR if pt wants Father and sister with MI - what ages? 10 year ASCVD risk is 3% Try crestor 5 if pt willing Could do zetia too if really opposed but has a lot of GI issues already Nothing else will be covered, don't see any risk factors? Cholesterol increasing over past few checks  Current Medications: none Intolerances: none Risk Factors: family history of CAD LDL goal:   Diet:   Exercise:   Family History: Father with MI and heart disease, sister with MI and DM2.  Social History: Occasional alchol use, denies tobacco and drug use.  Labs: 02/09/21: TC 257, TG 151, HDL 63, LDL 167 01/21/19: TC 233, TG 181, HDL 57, LDL 140 09/05/17: TC 231, TG 248, HDL 59, LDL 122  Past Medical History:  Diagnosis Date   Anxiety    Breast mass, right    Chronic GI bleeding 6/73/4193   Complication of anesthesia    Depression    Dysrhythmia    PVC,s, atrial tachycardia   Gastric ulcer    Hiatal hernia    Iron deficiency anemia 11/07/2011   Irregular heart rate    PONV (postoperative nausea and vomiting)    SBO (small bowel obstruction) ([facility])    in 2 different places per patient     Current Outpatient Medications on File Prior to Visit  Medication Sig Dispense Refill   acetaminophen (TYLENOL) 500 MG tablet Take 500 mg by mouth every 6 (six) hours as  needed for mild pain.     ALPRAZolam (XANAX) 0.5 MG tablet Take 0.25 mg by mouth daily as needed for anxiety.      ascorbic acid (VITAMIN C) 500 MG tablet Take 500 mg by mouth daily as needed.  (Patient not taking: Reported on 11/10/2021)     budesonide (ENTOCORT EC) 3 MG 24 hr capsule Take 2 capsules (6 mg total) by mouth daily. 60 capsule 3   cholecalciferol (VITAMIN D) 1000 units tablet Take 1,000 Units by mouth daily as needed.      cyclobenzaprine (FLEXERIL) 10 MG tablet Take 5-10 mg by mouth 3 (three) times daily. (Patient not taking: Reported on 11/10/2021)     eszopiclone (LUNESTA) 2 MG TABS tablet Take 2 mg by mouth at bedtime as needed. (Patient not taking: Reported on 11/10/2021)     metoprolol succinate (TOPROL-XL) 25 MG 24 hr tablet TAKE 1 TABLET BY MOUTH EVERY DAY 90 tablet 2   vitamin B-12 (CYANOCOBALAMIN) 1000 MCG tablet Take 1,000 mcg by mouth daily as needed.      No current facility-administered medications on file prior to visit.    Allergies  Allergen Reactions   Nsaids Other (See Comments)  Bleeding    Other     Laxatives and fleet emema as well and antihistamines   Venofer [Iron Sucrose] Other (See Comments)    Syncope, Numbness and tingling in arms and hands.   Zofran [Ondansetron]     malice   Zithromax [Azithromycin]     Assessment/Plan:  1. Hyperlipidemia -

## 2021-11-23 NOTE — Patient Instructions (Signed)
Medication Instructions:  Your physician recommends that you continue on your current medications as directed. Please refer to the Current Medication list given to you today.  *If you need a refill on your cardiac medications before your next appointment, please call your pharmacy*   Lab Work: None ordered.  If you have labs (blood work) drawn today and your tests are completely normal, you will receive your results only by: [place] (if you have MyChart) OR A paper copy in the mail If you have any lab test that is abnormal or we need to change your treatment, we will call you to review the results.   Testing/Procedures: None ordered.    Follow-Up: At [facility], you and your health needs are our priority.  As part of our continuing mission to provide you with exceptional heart care, we have created designated Provider Care Teams.  These Care Teams include your primary Cardiologist (physician) and Advanced Practice Providers (APPs -  Physician Assistants and Nurse Practitioners) who all work together to provide you with the care you need, when you need it.  We recommend signing up for the patient portal called "MyChart".  Sign up information is provided on this After Visit Summary.  MyChart is used to connect with patients for Virtual Visits (Telemedicine).  Patients are able to view lab/test results, encounter notes, upcoming appointments, etc.  Non-urgent messages can be sent to your provider as well.   To learn more about what you can do with MyChart, go to [email or web address].    Your next appointment:   12 months with Dr Caryl Comes

## 2021-11-25 ENCOUNTER — Encounter: Payer: Self-pay | Admitting: *Deleted

## 2021-11-25 ENCOUNTER — Telehealth: Payer: Self-pay | Admitting: Hematology & Oncology

## 2021-11-25 ENCOUNTER — Other Ambulatory Visit: Payer: Self-pay | Admitting: Hematology & Oncology

## 2021-11-26 ENCOUNTER — Telehealth: Payer: Self-pay | Admitting: Hematology & Oncology

## 2021-11-26 NOTE — Telephone Encounter (Signed)
Called patient per 11/25/21 schedule message to schedule an iron infusion , per patient does not want to schedule an iron infusion at the moment and will call in to schedule when ready.

## 2021-12-09 ENCOUNTER — Other Ambulatory Visit: Payer: Self-pay | Admitting: Gynecology

## 2021-12-09 DIAGNOSIS — Z1231 Encounter for screening mammogram for malignant neoplasm of breast: Secondary | ICD-10-CM

## 2021-12-17 ENCOUNTER — Telehealth: Payer: Self-pay | Admitting: *Deleted

## 2021-12-17 NOTE — Telephone Encounter (Signed)
Pt called schedulers wanting to know if she needs to keep her scheduled appts for 12/24/21 since she has not received iron yet and would like to know if peer to peer has been done on Monoferric.  Call placed back to patient by this RN and message left to notify pt that P2P for Monoferric has been done by Dr. Marin Olp and was denied. Instructed pt to call office back to review schedule and iron infusion.

## 2021-12-21 ENCOUNTER — Telehealth: Payer: Self-pay

## 2021-12-21 ENCOUNTER — Telehealth: Payer: Self-pay | Admitting: Gastroenterology

## 2021-12-21 ENCOUNTER — Other Ambulatory Visit: Payer: Self-pay

## 2021-12-21 ENCOUNTER — Encounter (HOSPITAL_BASED_OUTPATIENT_CLINIC_OR_DEPARTMENT_OTHER): Payer: Self-pay

## 2021-12-21 ENCOUNTER — Emergency Department (HOSPITAL_BASED_OUTPATIENT_CLINIC_OR_DEPARTMENT_OTHER)
Admission: EM | Admit: 2021-12-21 | Discharge: 2021-12-21 | Disposition: A | Discharge status: Home / Self Care | Payer: 59 | Attending: Emergency Medicine | Admitting: Emergency Medicine

## 2021-12-21 ENCOUNTER — Telehealth: Payer: Self-pay | Admitting: Hematology & Oncology

## 2021-12-21 ENCOUNTER — Emergency Department (HOSPITAL_BASED_OUTPATIENT_CLINIC_OR_DEPARTMENT_OTHER): Payer: 59

## 2021-12-21 DIAGNOSIS — K922 Gastrointestinal hemorrhage, unspecified: Secondary | ICD-10-CM | POA: Diagnosis not present

## 2021-12-21 DIAGNOSIS — K50111 Crohn's disease of large intestine with rectal bleeding: Secondary | ICD-10-CM | POA: Diagnosis not present

## 2021-12-21 DIAGNOSIS — K529 Noninfective gastroenteritis and colitis, unspecified: Secondary | ICD-10-CM | POA: Insufficient documentation

## 2021-12-21 DIAGNOSIS — R1030 Lower abdominal pain, unspecified: Secondary | ICD-10-CM | POA: Diagnosis present

## 2021-12-21 LAB — COMPREHENSIVE METABOLIC PANEL
ALT: 17 U/L (ref 0–44)
AST: 15 U/L (ref 15–41)
Albumin: 4.3 g/dL (ref 3.5–5.0)
Alkaline Phosphatase: 60 U/L (ref 38–126)
Anion gap: 10 (ref 5–15)
BUN: 13 mg/dL (ref 6–20)
CO2: 26 mmol/L (ref 22–32)
Calcium: 9.2 mg/dL (ref 8.9–10.3)
Chloride: 101 mmol/L (ref 98–111)
Creatinine, Ser: 0.8 mg/dL (ref 0.44–1.00)
GFR, Estimated: >60 mL/min (ref >60)
Glucose, Bld: 95 mg/dL (ref 70–99)
Potassium: 3.4 mmol/L — ABNORMAL LOW (ref 3.5–5.1)
Sodium: 137 mmol/L (ref 135–145)
Total Bilirubin: 0.7 mg/dL (ref 0.3–1.2)
Total Protein: 7.2 g/dL (ref 6.5–8.1)

## 2021-12-21 LAB — CBC WITH DIFFERENTIAL/PLATELET
Abs Immature Granulocytes: 0.02 10*3/uL (ref 0.00–0.07)
Basophils Absolute: 0 10*3/uL (ref 0.0–0.1)
Basophils Relative: 0 %
Eosinophils Absolute: 0 10*3/uL (ref 0.0–0.5)
Eosinophils Relative: 0 %
HCT: 36.6 % (ref 36.0–46.0)
Hemoglobin: 11.7 g/dL — ABNORMAL LOW (ref 12.0–15.0)
Immature Granulocytes: 0 %
Lymphocytes Relative: 16 %
Lymphs Abs: 1.2 10*3/uL (ref 0.7–4.0)
MCH: 29.5 pg (ref 26.0–34.0)
MCHC: 32 g/dL (ref 30.0–36.0)
MCV: 92.2 fL (ref 80.0–100.0)
Monocytes Absolute: 0.7 10*3/uL (ref 0.1–1.0)
Monocytes Relative: 9 %
Neutro Abs: 5.9 10*3/uL (ref 1.7–7.7)
Neutrophils Relative %: 75 %
Platelets: 277 10*3/uL (ref 150–400)
RBC: 3.97 MIL/uL (ref 3.87–5.11)
RDW: 13.2 % (ref 11.5–15.5)
WBC: 7.9 10*3/uL (ref 4.0–10.5)
nRBC: 0 % (ref 0.0–0.2)

## 2021-12-21 LAB — SEDIMENTATION RATE: Sed Rate: 10 mm/hr (ref 0–22)

## 2021-12-21 LAB — PROTIME-INR
INR: 0.9 (ref 0.8–1.2)
Prothrombin Time: 12.2 seconds (ref 11.4–15.2)

## 2021-12-21 LAB — C-REACTIVE PROTEIN: CRP: 2.3 mg/dL — ABNORMAL HIGH (ref <1.0)

## 2021-12-21 LAB — OCCULT BLOOD X 1 CARD TO LAB, STOOL: Fecal Occult Bld: POSITIVE — AB

## 2021-12-21 MED ORDER — IOHEXOL 300 MG/ML  SOLN
100.0000 mL | Freq: Once | INTRAMUSCULAR | Status: AC | PRN
  Administered 2021-12-21: 100 mL via INTRAVENOUS

## 2021-12-21 MED ORDER — PREDNISONE 20 MG PO TABS: 40.0000 mg | ORAL_TABLET | Freq: Every day | ORAL | 0 refills | Status: DC

## 2021-12-21 MED ORDER — PREDNISONE 20 MG PO TABS
40.0000 mg | ORAL_TABLET | Freq: Once | ORAL | Status: AC
Start: 2021-12-21 — End: 2021-12-21
  Administered 2021-12-21: 40 mg via ORAL
  Filled 2021-12-21: qty 2

## 2021-12-21 NOTE — Discharge Instructions (Signed)
Please read and follow all provided instructions.  Your diagnoses today include:  1. Lower abdominal pain   2. Colitis   3. Lower GI bleed   4. Crohn's disease of colon with rectal bleeding ([place])     Tests performed today include: Blood cell counts and platelets Kidney and liver function tests Pancreas function test (called lipase) CT scan of the abdomen pelvis: Demonstrates colitis of the sigmoid colon, possibly related to a flare of Crohn's disease.  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 Vital signs. See below for your results today.   Medications prescribed:  Prednisone - steroid medicine   It is best to take this medication in the morning to prevent sleeping problems. If you are diabetic, monitor your blood sugar closely and stop taking Prednisone if blood sugar is over 300. Take with food to prevent stomach upset.   Take any prescribed medications only as directed.  Home care instructions:  Follow any educational materials contained in this packet.  Follow-up instructions: Please follow-up with Dr. Bryan Lemma next week.  This is important because your stool steroid medications will need to be tapered in a controlled manner.  Return instructions:  SEEK IMMEDIATE MEDICAL ATTENTION IF: The pain does not go away or becomes severe  A temperature above 101F develops  Repeated vomiting occurs (multiple episodes)  The pain becomes localized to portions of the abdomen. The right side could possibly be appendicitis. In an adult, the left lower portion of the abdomen could be colitis or diverticulitis.  Blood is being passed in stools or vomit (bright red or black tarry stools)  You develop chest pain, difficulty breathing, dizziness or fainting, or become confused, poorly responsive, or inconsolable (young children) If you have any other emergent concerns regarding your health  Additional Information: Abdominal (belly) pain can be caused by  many things. Your caregiver performed an examination and possibly ordered blood/urine tests and imaging (CT scan, x-rays, ultrasound). Many cases can be observed and treated at home after initial evaluation in the emergency department. Even though you are being discharged home, abdominal pain can be unpredictable. Therefore, you need a repeated exam if your pain does not resolve, returns, or worsens. Most patients with abdominal pain don't have to be admitted to the hospital or have surgery, but serious problems like appendicitis and gallbladder attacks can start out as nonspecific pain. Many abdominal conditions cannot be diagnosed in one visit, so follow-up evaluations are very important.  Your vital signs today were: BP 121/72    Pulse 99    Temp 97.9 F (36.6 C) (Oral)    Resp 17    Ht 5' 5.5" (1.664 m)    Wt 57.2 kg    SpO2 100%    BMI 20.65 kg/m  If your blood pressure (bp) was elevated above 135/85 this visit, please have this repeated by your doctor within one month. --------------

## 2021-12-21 NOTE — Telephone Encounter (Signed)
LVM for patient to call back to schedule appointment

## 2021-12-21 NOTE — Telephone Encounter (Signed)
Patient currently being seen in the [facility] ER.  Labs appear to be at baseline without any leukocytosis.  CT with sigmoid wall thickening.  ER staff has already discussed with Tye Savoy who relayed recommendations.  Will check ESR, CRP, fecal calprotectin.  Please find appointment for this patient with me this week or early next week for expedited ER follow-up appointment.

## 2021-12-21 NOTE — Telephone Encounter (Signed)
Pt is currently being evaluated at [facility] [place]. Pt's husband arrived at the [facility] office requesting a call back about pt.

## 2021-12-21 NOTE — ED Triage Notes (Signed)
Starting at 8pm yesterday, states HR was at 135. Has had abdominal pain. Hx of anemia. States her BP was low and felt lightheaded. At 0100 today started with lower abdominal cramping and with rectal bleeding.

## 2021-12-21 NOTE — Telephone Encounter (Signed)
Inbound call from patient states she have been experiencing abd pain since Sunday, have a bowel changes and constipation that causes blood when goes. Patient is stating she is anemic

## 2021-12-21 NOTE — Telephone Encounter (Signed)
Received VM from pt reporting cramping, PRBPR and elevated HR. Pt states she knows she is anemic and would like to get Monoferric infusion scheduled although she knows this is not approved by insurance. Pt states she is willing to pay out of pocket for Monoferric as she is not willing to try any other iron infusions, "since I have had so many problems in the past." Per message pt intends to contact GI MD as well.   When chart was opened for review by this RN, pt found to be checked in the ED.   Message to scheduling for infusion with instructions to confirm patient will be paying out of pocket. dph

## 2021-12-21 NOTE — Telephone Encounter (Signed)
Scheduled 1-12 at 1020 with patient

## 2021-12-21 NOTE — Telephone Encounter (Signed)
Called and spoke with patient's husband. He states that last night patient was feeling faint, her HR was 75 and her BP was low. He states that patient's resting HR last night was ranging from 102-135. He reports that she also began having severe lower abdominal cramping and felt like she needed to have a BM but couldn't. He reports that patient was passing blood and mucous, no stool. He states that patient has not had a BM and he is not sure when her last BM was. He states that patient is anemic. He reports that pt had reactions to iron infusion before and has reached out to Hematology to see if she can get a different type of infusion. Pt's husband reports no changes in her medications. He states that she has been taking Budesonide 6 mg daily since her procedure at [facility]. She is currently at [place]. Labs have been drawn, awaiting results. He knows that our providers only round at [facility] and [facility]. He knows that we will call him with any other recommendations. Pt's husband verbalized understanding and had no other concerns at the end of the call.

## 2021-12-21 NOTE — ED Provider Notes (Signed)
[place] EMERGENCY DEPARTMENT Provider Note   CSN: [id number] Arrival date & time: 12/21/21  1002     History  Chief Complaint  Patient presents with   Palpitations   Rectal Bleeding    Carolyn Chaney is a 61 y.o. female.  Patient with history of iron deficiency anemia, history of iron infusions but poorly tolerated due to severe allergic reactions, Crohn's disease, small bowel obstruction/stricture status post small bowel resection, atrial tachycardia on metoprolol--presents to the emergency department for evaluation of lower abdominal cramping pain as well as new bright red blood with bowel movements since last evening.  Patient has not had an iron infusion since spring 2022.  She feels that she is probably anemic.  She has not had blood in her stool before.  No history of anticoagulation.  No urinary symptoms.  No chest pain or shortness of breath.  No fevers.  She reports elevated heart rates last night and this morning, to 135.  She continues to take metoprolol daily.      Home Medications Prior to Admission medications   Medication Sig Start Date End Date Taking? Authorizing Provider  acetaminophen (TYLENOL) 500 MG tablet Take 500 mg by mouth every 6 (six) hours as needed for mild pain.    [provider]  ALPRAZolam Duanne Moron) 0.5 MG tablet Take 0.25 mg by mouth daily as needed for anxiety.  10/11/11   Selinda Orion, MD  ascorbic acid (VITAMIN C) 500 MG tablet Take 500 mg by mouth daily as needed.    [provider]  cholecalciferol (VITAMIN D) 1000 units tablet Take 1,000 Units by mouth daily as needed.     [provider]  cyclobenzaprine (FLEXERIL) 10 MG tablet Take 5-10 mg by mouth 3 (three) times daily. 09/15/21   [provider]  eszopiclone (LUNESTA) 2 MG TABS tablet Take 2 mg by mouth at bedtime as needed. 09/27/21   [provider]  metoprolol succinate (TOPROL-XL) 25 MG 24 hr tablet TAKE 1 TABLET BY MOUTH EVERY  DAY 09/15/21   Deboraha Sprang, MD  vitamin B-12 (CYANOCOBALAMIN) 1000 MCG tablet Take 1,000 mcg by mouth daily as needed.     [provider]      Allergies    Nsaids, Other, Venofer [iron sucrose], Zofran [ondansetron], and Zithromax [azithromycin]    Review of Systems   Review of Systems  Constitutional:  Negative for fever.  HENT:  Negative for rhinorrhea and sore throat.   Eyes:  Negative for redness.  Respiratory:  Negative for cough.   Cardiovascular:  Positive for palpitations. Negative for chest pain.  Gastrointestinal:  Positive for abdominal pain and blood in stool. Negative for diarrhea, nausea and vomiting.  Genitourinary:  Negative for dysuria, frequency, hematuria and urgency.  Musculoskeletal:  Negative for myalgias.  Skin:  Negative for color change and rash.       Positive for abscess.  Neurological:  Negative for headaches.  Hematological:  Negative for adenopathy.   Physical Exam Updated Vital Signs BP (!) 145/93 (BP Location: Right Arm)    Pulse 98    Temp 97.9 F (36.6 C) (Oral)    Resp (!) 22    Ht 5' 5.5" (1.664 m)    Wt 57.2 kg    SpO2 100%    BMI 20.65 kg/m  Physical Exam Vitals and nursing note reviewed.  Constitutional:      General: She is not in acute distress.    Appearance: She is well-developed.  HENT:     Head: Normocephalic and atraumatic.     Right Ear: External ear normal.     Left Ear: External ear normal.     Nose: Nose normal.  Eyes:     Conjunctiva/sclera: Conjunctivae normal.  Cardiovascular:     Rate and Rhythm: Normal rate and regular rhythm.     Heart sounds: No murmur heard. Pulmonary:     Effort: No respiratory distress.     Breath sounds: No wheezing, rhonchi or rales.  Abdominal:     Palpations: Abdomen is soft.     Tenderness: There is abdominal tenderness. There is no guarding or rebound.     Comments: Bilateral lower abdominal tenderness, mild, no rebound or guarding  Musculoskeletal:     Cervical back:  Normal range of motion and neck supple.     Right lower leg: No edema.     Left lower leg: No edema.  Skin:    General: Skin is warm and dry.     Findings: No rash.  Neurological:     General: No focal deficit present.     Mental Status: She is alert. Mental status is at baseline.     Motor: No weakness.  Psychiatric:        Mood and Affect: Mood normal.    ED Results / Procedures / Treatments   Labs (all labs ordered are listed, but only abnormal results are displayed) Labs Reviewed  COMPREHENSIVE METABOLIC PANEL - Abnormal; Notable for the following components:      Result Value   Potassium 3.4 (*)    All other components within normal limits  CBC WITH DIFFERENTIAL/PLATELET - Abnormal; Notable for the following components:   Hemoglobin 11.7 (*)    All other components within normal limits  OCCULT BLOOD X 1 CARD TO LAB, STOOL - Abnormal; Notable for the following components:   Fecal Occult Bld POSITIVE (*)    All other components within normal limits  CALPROTECTIN, FECAL  PROTIME-INR  SEDIMENTATION RATE  C-REACTIVE PROTEIN    EKG EKG Interpretation  Date/Time:  Tuesday December 21 2021 10:12:38 EST Ventricular Rate:  97 PR Interval:  166 QRS Duration: 82 QT Interval:  350 QTC Calculation: 445 R Axis:   79 Text Interpretation: Sinus rhythm Confirmed by Lennice Sites ([id number]) on 12/21/2021 10:17:30 AM  Radiology CT ABDOMEN PELVIS W CONTRAST  Result Date: 12/21/2021 CLINICAL DATA:  GI bleeding, lower abdominal pain EXAM: CT ABDOMEN AND PELVIS WITH CONTRAST TECHNIQUE: Multidetector CT imaging of the abdomen and pelvis was performed using the standard protocol following bolus administration of intravenous contrast. CONTRAST:  165m OMNIPAQUE IOHEXOL 300 MG/ML  SOLN COMPARISON:  04/04/2021 FINDINGS: Lower chest: Unremarkable. Hepatobiliary: There is 13 x 9 mm low-density lesion in the dome of right lobe of liver. Gallbladder is not distended. Pancreas: No focal abnormality is  seen. Spleen: Unremarkable. Adrenals/Urinary Tract: Adrenals are unremarkable. There is no hydronephrosis. There are no renal or ureteral stones. There are few subcentimeter low-density foci in the renal cortex on both sides suggesting possible cysts. Urinary bladder is not distended. Stomach/Bowel: Stomach is unremarkable. Small bowel loops are not dilated. Surgical staples are seen in the ileocecal junction. Appendix is not distinctly seen. There is no pericecal inflammation. There is abnormal diffuse wall thickening and mucosal enhancement in the sigmoid colon which was not evident in the previous study. Trace amount of free fluid is seen adjacent to sigmoid. Vascular/Lymphatic: Unremarkable. Reproductive: Unremarkable. Other: There is no ascites or pneumoperitoneum. Musculoskeletal:  Unremarkable. IMPRESSION: There is abnormal diffuse wall thickening and mucosal enhancement in the sigmoid colon suggesting inflammatory or infectious colitis. There is no loculated pericolic fluid collection. There is no evidence of intestinal obstruction or pneumoperitoneum. There is no hydronephrosis. There is 13 mm low-density structure in the right lobe of liver, possibly a cyst or hemangioma. Follow-up CT in 3 months may be considered to assess stability. Possible bilateral renal cysts. Electronically Signed   By: Elmer Picker M.D.   On: 12/21/2021 12:16    Procedures Procedures    Medications Ordered in ED Medications - No data to display  ED Course/ Medical Decision Making/ A&P    Patient seen and examined. Plan discussed with patient.   Labs: CBC, CMP, UA  Imaging: CT of the abdomen and pelvis  Medications/Fluids: 500 cc normal saline fluid bolus  Vital signs reviewed and are as follows: BP (!) 145/93 (BP Location: Right Arm)    Pulse 98    Temp 97.9 F (36.6 C) (Oral)    Resp (!) 22    Ht 5' 5.5" (1.664 m)    Wt 57.2 kg    SpO2 100%    BMI 20.65 kg/m   Initial impression: abdominal pain,  rectal bleeding in setting of chronic anemia  12:35 PM Reassessment performed. Patient appears comfortable.   Reviewed pertinent lab work/imaging with patient at bedside including normal CBC, CT imaging demonstrating sigmoid colitis.  Vital signs reviewed and are as follows: BP 136/79    Pulse 81    Temp 97.9 F (36.6 C) (Oral)    Resp 14    Ht 5' 5.5" (1.664 m)    Wt 57.2 kg    SpO2 100%    BMI 20.65 kg/m   Plan: We will discuss with patient's GI group for treatment reccs.   2:24 PM I spoke with Carolyn Holstein NP with [facility] who was kind enough to help determine recommendations for patient's colitis flare.  She has spoken with Dr. Bryan Lemma.  Recommends initiating oral prednisone 40 mg daily.  Asked that we send ESR, CRP, fecal calprotectin if possible.  Otherwise agree with discharge to home with close follow-up in office.  Will arrange for follow-up appointment in the next week.  Patient and husband at bedside.  We discussed all recommendations.  On reexam, patient appears quite comfortable.  She is in agreement with plan.  The patient was urged to return to the Emergency Department immediately with worsening of current symptoms, worsening abdominal pain, persistent vomiting, worsening blood noted in stools, fever, or any other concerns. The patient verbalized understanding.                           Medical Decision Making  Patient with lower abdominal pain and rectal bleeding.  CT imaging demonstrates colitis.  Patient has a history of Crohn's disease and this may represent a flare of Crohn's colitis.  No fevers or elevated white count, profuse diarrhea to suggest infectious colitis at this point. Vitals are stable, no fever. Labs hemoglobin 11.7, potassium 3.4. Imaging as above. No signs of dehydration, patient is tolerating PO's. Lungs are clear and no signs suggestive of PNA. Low concern for appendicitis, cholecystitis, pancreatitis, ruptured viscus, UTI, kidney stone, aortic dissection,  aortic aneurysm or other emergent abdominal etiology. Supportive therapy indicated with return if symptoms worsen.   Chronic anemia: This appears stable today.  No indication for emergent blood transfusion.  Likely related to colitis.  Final Clinical Impression(s) / ED Diagnoses Final diagnoses:  Lower abdominal pain  Colitis  Lower GI bleed  Crohn's disease of colon with rectal bleeding ([place])    Rx / DC Orders ED Discharge Orders          Ordered    predniSONE (DELTASONE) 20 MG tablet  Daily        12/21/21 1422              Carlisle Cater, PA-C 12/21/21 1428    [place], [place], DO 12/21/21 1428

## 2021-12-22 ENCOUNTER — Telehealth: Payer: Self-pay | Admitting: Nurse Practitioner

## 2021-12-23 ENCOUNTER — Encounter: Payer: Self-pay | Admitting: Gastroenterology

## 2021-12-23 ENCOUNTER — Other Ambulatory Visit (INDEPENDENT_AMBULATORY_CARE_PROVIDER_SITE_OTHER): Payer: 59

## 2021-12-23 ENCOUNTER — Other Ambulatory Visit: Payer: Self-pay

## 2021-12-23 ENCOUNTER — Ambulatory Visit (INDEPENDENT_AMBULATORY_CARE_PROVIDER_SITE_OTHER): Payer: 59 | Admitting: Gastroenterology

## 2021-12-23 VITALS — BP 124/70 | HR 73 | Ht 65.5 in | Wt 126.2 lb

## 2021-12-23 DIAGNOSIS — K50919 Crohn's disease, unspecified, with unspecified complications: Secondary | ICD-10-CM | POA: Diagnosis not present

## 2021-12-23 DIAGNOSIS — K2100 Gastro-esophageal reflux disease with esophagitis, without bleeding: Secondary | ICD-10-CM

## 2021-12-23 DIAGNOSIS — D509 Iron deficiency anemia, unspecified: Secondary | ICD-10-CM | POA: Diagnosis not present

## 2021-12-23 DIAGNOSIS — K7689 Other specified diseases of liver: Secondary | ICD-10-CM

## 2021-12-23 LAB — C-REACTIVE PROTEIN: CRP: 1.7 mg/dL (ref 0.5–20.0)

## 2021-12-23 LAB — CALPROTECTIN, FECAL: Calprotectin, Fecal: 320 ug/g — ABNORMAL HIGH (ref 0–120)

## 2021-12-23 LAB — SEDIMENTATION RATE: Sed Rate: 8 mm/hr (ref 0–30)

## 2021-12-23 MED ORDER — PREDNISONE 10 MG PO TABS: ORAL_TABLET | ORAL | 0 refills | Status: AC

## 2021-12-23 NOTE — Telephone Encounter (Signed)
Patient in ED ( Carolyn Chaney) earlier this week with abdominal pain and rectal bleeding. Hgb was stable. Has Crohn's, s/p resection of ileal stricture. Taking Budesonide, hasn't wanted to start Biologics for Crohn's. As far as I can tell she hasn't ever had left colon involvement. No diarrhea to suggest infectious etiology.    Spoke with Dr. Bryan Lemma. Suggested to EDP that patient go home on Prednisone 40 mg and our office will work her in for appt to see him next week. We also asked for CRP, ESR and fecal coloproctectomy to be obtained prior to leaving ED

## 2021-12-23 NOTE — Patient Instructions (Addendum)
If you are age 61 or older, your body mass index should be between 23-30. Your There is no height or weight on file to calculate BMI. If this is out of the aforementioned range listed, please consider follow up with your Primary Care Provider.  If you are age 57 or younger, your body mass index should be between 19-25. Your There is no height or weight on file to calculate BMI. If this is out of the aformentioned range listed, please consider follow up with your Primary Care Provider.   __________________________________________________________  The [facility] providers would like to encourage you to use [facility] to communicate with providers for non-urgent requests or questions.  Due to long hold times on the telephone, sending your provider a message by [facility] may be a faster and more efficient way to get a response.  Please allow 48 business hours for a response.  Please remember that this is for non-urgent requests.   Due to recent changes in healthcare laws, you may see the results of your imaging and laboratory studies on MyChart before your provider has had a chance to review them.  We understand that in some cases there may be results that are confusing or concerning to you. Not all laboratory results come back in the same time frame and the provider may be waiting for multiple results in order to interpret others.  Please give [place] 48 hours in order for your provider to thoroughly review all the results before contacting the office for clarification of your results.   Please go to the lab on the 2nd floor [address] before you leave the office today.  Please come back in 4 weeks to get calprotectin fecal stool test.  We have sent the following medications to your pharmacy for you to pick up at your convenience: Prednisone  You have been scheduled for an appointment with Dr. Gerrit Heck on *02/03/22 at 1:20 pm Please arrive 10 minutes early for your appointment.      Thank you for  choosing me and [facility].  Vito Cirigliano, D.O.

## 2021-12-23 NOTE — Progress Notes (Signed)
Chief Complaint:    ER follow-up, Crohn's  GI History: 61 year old female initially seen in the GI clinic 09/16/2020 for evaluation of hematochezia and IDA.  History of IDA dating back to 09/2011 with intermittent IV iron infusions over the years.  EGD/colonoscopy completed in 02/2016 for evaluation of IDA as outlined below.     Endoscopic/GI Evaluation history: -Per patient reports history of NSAID induced ulcers with bowel obstruction 2/2 stricture in ~1998. Ex-lap with appendectomy performed. UGI series at that time n/f SB obstruction per patient, and treated with NSAID cessation and conservative management. She had extensive w/u for migraines, but eventuallly resorted to NSAIDs again as the only effective tx. -Colonoscopy (11/2002): Normal except for erosion in the cecum.  Random biopsies demonstrate active colitis without chronic changes -EGD (2007): Normal including normal small bowel biopsies -Colonoscopy (01/15/2010): Normal.  Rectal hyperplastic polyps.  Repeat 10 years -09/16/2011: Ferritin 6 -CT enterography (09/2011): Distal ileum with several short segments of bowel wall thickening and mucosal enhancement (ileitis).  No obstruction -EGD (02/16/2016, Dr. Howell Rucks): Normal.  Biopsies negative for celiac -Colonoscopy (02/16/2016, Dr. Howell Rucks): Shallow erosion at IC valve (biopsy: Nonspecific ulcer), Otherwise normal colon.  Unable to intubate IC valve and examined terminal ileum - 07/2020: Ferritin <4, iron 29, TIBC 474, sat 6%.  H/H 8.8/29.9 with MCV/RDW 86/30.7. -Colonoscopy (11/12/2020, Dr. Bryan Lemma): nonbleeding grade 1 internal hemorrhoids, moderate stenosis of the ileocecal valve which was eventually traversed, second stenosis approximately 2 cm proximal to the ICV which could not be traversed, visualized mucosa in the terminal ileum was erythematous and with ulcer x2; pathology showed no active or chronic inflammatory changes to support or suggest Crohn's disease - EGD  (11/12/2020, Dr. Bryan Lemma): LA grade A reflux esophagitis and gastritis; placed on pantoprazole 40 twice a day and Carafate; pathology showed mild gastritis with no evidence of H. pylori - Hospital admission 03/2021 for small bowel obstruction - CT (04/04/2021): Fluid-filled mildly dilated distal small bowel with mucosal thickening and stricturing at the terminal ileum suggesting partial obstruction at this location.  Possible duodenal ulcer - MR Enterography (04/07/2021): Persistent wall thickening and enhancement of the distal and terminal ileum.  Suspect stricture narrowing likely from chronic inflammation (chronic underlying scarring change with fibrosis) plus active inflammation causing combination of mild partial functional mechanical obstruction. - 04/2021: IV iron - 04/20/2021: GI follow-up: Was feeling better.  Reports only taking the budesonide 9 mg/day for couple days, then reduce to 6 mg/day for a couple days, and was at 3 mg/day at that time due to concerns about medication induced symptoms. - 05/05/2021: Case discussed at GI multidisciplinary conference.  Higher suspicion for stricturing Crohn's Disease rather than NSAID induced since she has very rare NSAID use since age 17 and none in the last 10-12 years.  Imaging with 10+ centimeter stricture in the distal ileum with some superimposed inflammatory changes.  Recommended trial of prednisone but patient declined. - Evaluated by Dr. Hester Mates at [facility]. - 05/26/2021: Underwent right hemicolectomy and 16 cm of ileal resection at [facility].  Pathology suspicious for Crohn's Disease. - 07/27/2021: Follow-up in GI clinic.  Had mild flare approximately 6 weeks post op.  Had restarted budesonide 4.5 mg/day with improvement.     Separately, reflux well controlled since reducing Protonix to 20 mg/day.  HPI:     Patient is a 61 y.o. female presenting to the Gastroenterology Clinic for follow-up.  Last seen by me 10/14/2021.  She canceled her  Entyvio infusion as she  was worried about medication ADR profile, Biologics, etc.  She was very apprehensive about starting any new medications, so was continued on budesonide alone with plan to wean off.  Was seen in the ER on 12/21/2021 with palpitations, nausea, lower abdominal pain, and rectal bleeding. No diarrhea, was actually having constipation and took Colace x1.  Was still taking budesonide 6 mg/day at that time. - Normal WBC - H/H at baseline 11.7/36.6 - K3.4, otherwise normal CMP - Normal INR - FOBT positive - CRP 2.3, normal ESR, calprotectin 320 - CT abdomen/pelvis: Diffuse wall thickening and mucosal enhancement in sigmoid colon.  No obstruction.  13 mm low-density structure in the right lobe of the liver, possibly a cyst or hemangioma.  Recommend follow-up CT in 3 months.  Pain improved in ER and was d/c-ed home. Was prescribed Prednisone 40 mg/day.  Of note, she presents 16 minutes late for a 20-minute appointment today.  Today, states pain has not recurred. Has not had a BM in the last 2 days. Baseline is 2 BM/week.   Review of systems:     No chest pain, no SOB, no fevers, no urinary sx   Past Medical History:  Diagnosis Date   Anxiety    Breast mass, right    Chronic GI bleeding 8/33/8250   Complication of anesthesia    Depression    Dysrhythmia    PVC,s, atrial tachycardia   Gastric ulcer    Hiatal hernia    Iron deficiency anemia 11/07/2011   Irregular heart rate    PONV (postoperative nausea and vomiting)    SBO (small bowel obstruction) ([place])    in 2 different places per patient     Patient's surgical history, family medical history, social history, medications and allergies were all reviewed in Epic    Current Outpatient Medications  Medication Sig Dispense Refill   acetaminophen (TYLENOL) 500 MG tablet Take 500 mg by mouth every 6 (six) hours as needed for mild pain.     ALPRAZolam (XANAX) 0.5 MG tablet Take 0.25 mg by mouth daily as needed for  anxiety.      cholecalciferol (VITAMIN D) 1000 units tablet Take 1,000 Units by mouth daily as needed.      eszopiclone (LUNESTA) 2 MG TABS tablet Take 2 mg by mouth at bedtime as needed.     metoprolol succinate (TOPROL-XL) 25 MG 24 hr tablet TAKE 1 TABLET BY MOUTH EVERY DAY 90 tablet 2   predniSONE (DELTASONE) 20 MG tablet Take 2 tablets (40 mg total) by mouth daily for 14 days. 28 tablet 0   vitamin B-12 (CYANOCOBALAMIN) 1000 MCG tablet Take 1,000 mcg by mouth daily as needed.      cyclobenzaprine (FLEXERIL) 10 MG tablet Take 5-10 mg by mouth 3 (three) times daily. (Patient not taking: Reported on 12/23/2021)     No current facility-administered medications for this visit.    Physical Exam:     Ht 5' 5.5" (1.664 m)    Wt 126 lb 4 oz (57.3 kg)    BMI 20.69 kg/m   GENERAL:  Pleasant female in NAD PSYCH: : Cooperative, normal affect EMusculoskeletal:  Normal muscle tone, normal strength NEURO: Alert and oriented x 3, no focal neurologic deficits   IMPRESSION and PLAN:    1) Crohn's Disease 2) History of right hemicolectomy and distal ileal resection 61 year old female with stricturing type ileal Crohn's Disease.  Diagnosis established at the time of ileal resection for chronic, obstructing stricture.  No prior evidence of  colonic disease, but most recent CT from ER earlier this week demonstrates left-sided wall thickening in the sigmoid colon, suspicious for colonic Crohn's Disease.  We discussed this at length today, to include a full DDx, with plan for the following:  - Continue prednisone 40 mg/day x2 weeks, then reduce by 10 mg/day each week - Can hold budesonide while on prednisone.  Plan to restart when prednisone down to 10 mg/day - Patient still apprehensive about initiating biologic therapy.  We discussed that again today.  She seems more interested in [place], but would like to read more about it again and get back to me on starting - Discussed the role of colonoscopy,  particularly to rule out colonic Crohn's Disease.  Would instead like to proceed with noninvasive evaluation options - Check ESR, CRP, fecal calprotectin in 4 weeks.  If still elevated, likely plan for colonoscopy and initiating biologic therapy  3) Hepatic lesion on CT - Incidentally noted 13 mm low-density structure in the right lobe of the liver - MRI liver in 3-6 months for further characterization and stability  4) Iron deficiency anemia - Continue follow-up in the Hematology clinic as scheduled  5) GERD with erosive esophagitis - Well-controlled - Continue antireflux lifestyle/dietary modifications   - RTC in 6 weeks or sooner as needed  I spent 30 minutes of time, including in depth chart review, independent review of results as outlined above, communicating results with the patient directly, face-to-face time with the patient, coordinating care, and ordering studies and medications as appropriate, and documentation.            Lavena Bullion ,DO, FACG 12/23/2021, 10:41 AM

## 2021-12-24 ENCOUNTER — Inpatient Hospital Stay: Payer: 59

## 2021-12-24 ENCOUNTER — Inpatient Hospital Stay: Payer: 59 | Admitting: Hematology & Oncology

## 2021-12-24 ENCOUNTER — Telehealth: Payer: Self-pay

## 2021-12-24 DIAGNOSIS — K50919 Crohn's disease, unspecified, with unspecified complications: Secondary | ICD-10-CM

## 2021-12-24 NOTE — Telephone Encounter (Signed)
-----  Message from [place], DO sent at 12/24/2021  2:47 PM EST ----- Normal ESR and CRP (inflammatory markers).  However, the plan was to repeat these in 4-6 weeks, not immediate.  Can you please call patient to update her on these results with plan to repeat ESR, CRP, fecal calprotectin in 4-6 weeks.  Thank you

## 2021-12-24 NOTE — Telephone Encounter (Signed)
LVM to the patient regarding her lab result and to repeat those blood work along with fecal calproctectin in 4 to 6 weeks. I let her know that orders have already been placed.

## 2022-01-13 ENCOUNTER — Ambulatory Visit: Payer: 59

## 2022-01-21 ENCOUNTER — Ambulatory Visit
Admission: RE | Admit: 2022-01-21 | Discharge: 2022-01-21 | Disposition: A | Discharge status: Home / Self Care | Payer: 59 | Source: Ambulatory Visit | Attending: Gynecology | Admitting: Gynecology

## 2022-01-21 DIAGNOSIS — Z1231 Encounter for screening mammogram for malignant neoplasm of breast: Secondary | ICD-10-CM

## 2022-01-26 ENCOUNTER — Other Ambulatory Visit: Payer: 59

## 2022-01-26 DIAGNOSIS — K50919 Crohn's disease, unspecified, with unspecified complications: Secondary | ICD-10-CM

## 2022-01-31 LAB — CALPROTECTIN: Calprotectin: 31 mcg/g

## 2022-02-03 ENCOUNTER — Ambulatory Visit: Payer: 59 | Admitting: Gastroenterology

## 2022-02-11 ENCOUNTER — Ambulatory Visit (INDEPENDENT_AMBULATORY_CARE_PROVIDER_SITE_OTHER): Payer: 59 | Admitting: Gastroenterology

## 2022-02-11 ENCOUNTER — Other Ambulatory Visit: Payer: Self-pay

## 2022-02-11 ENCOUNTER — Encounter: Payer: Self-pay | Admitting: Gastroenterology

## 2022-02-11 VITALS — BP 126/76 | HR 80 | Ht 65.5 in | Wt 124.2 lb

## 2022-02-11 DIAGNOSIS — K2100 Gastro-esophageal reflux disease with esophagitis, without bleeding: Secondary | ICD-10-CM

## 2022-02-11 DIAGNOSIS — D509 Iron deficiency anemia, unspecified: Secondary | ICD-10-CM

## 2022-02-11 DIAGNOSIS — K589 Irritable bowel syndrome without diarrhea: Secondary | ICD-10-CM

## 2022-02-11 DIAGNOSIS — R109 Unspecified abdominal pain: Secondary | ICD-10-CM

## 2022-02-11 DIAGNOSIS — K50919 Crohn's disease, unspecified, with unspecified complications: Secondary | ICD-10-CM | POA: Diagnosis not present

## 2022-02-11 DIAGNOSIS — K7689 Other specified diseases of liver: Secondary | ICD-10-CM

## 2022-02-11 MED ORDER — DICYCLOMINE HCL 10 MG PO CAPS: 10.0000 mg | ORAL_CAPSULE | Freq: Four times a day (QID) | ORAL | 3 refills | Status: DC | PRN

## 2022-02-11 NOTE — Patient Instructions (Addendum)
If you are age 61 or older, your body mass index should be between 23-30. Your Body mass index is 20.36 kg/m?Marland Kitchen If this is out of the aforementioned range listed, please consider follow up with your Primary Care Provider. ? ?If you are age 94 or younger, your body mass index should be between 19-25. Your Body mass index is 20.36 kg/m?Marland Kitchen If this is out of the aformentioned range listed, please consider follow up with your Primary Care Provider.  ? ?________________________________________________________ ? ?The [place] [facility] providers would like to encourage you to use [facility] to communicate with providers for non-urgent requests or questions.  Due to long hold times on the telephone, sending your provider a message by [facility] may be a faster and more efficient way to get a response.  Please allow 48 business hours for a response.  Please remember that this is for non-urgent requests.  ?_______________________________________________________ ? ? ?Please go to the lab on the 2nd floor [address] in 3 months to have lab work done. You can pick up stool kit to have at home until you return for your lab work. Please complete on or after 05-14-2022.  ? ?Please go to the radiology department on the 1st floor to schedule your Bone Density Test. This need to be to completed on or after 08-14-2022. Phone number is [phone]. An order has been placed. ? ?We have sent the following medications to your pharmacy for you to pick up at your convenience: ?Bentyl ? ?Please call in 6 month to schedule an office visit. ? ?Please call with any questions or concerns. ? ?It was a pleasure to see you today! ? ?Gerrit Heck, D.O. ? ? ?

## 2022-02-11 NOTE — Progress Notes (Signed)
Chief Complaint:    Crohn's disease  GI History: 61 year old female initially seen in the GI clinic 09/16/2020 for evaluation of hematochezia and IDA.  History of IDA dating back to 09/2011 with intermittent IV iron infusions over the years.  EGD/colonoscopy completed in 02/2016 for evaluation of IDA as outlined below.     Endoscopic/GI Evaluation history: -Per patient reports history of NSAID induced ulcers with bowel obstruction 2/2 stricture in ~1998. Ex-lap with appendectomy performed. UGI series at that time n/f SB obstruction per patient, and treated with NSAID cessation and conservative management. She had extensive w/u for migraines, but eventuallly resorted to NSAIDs again as the only effective tx. -Colonoscopy (11/2002): Normal except for erosion in the cecum.  Random biopsies demonstrate active colitis without chronic changes -EGD (2007): Normal including normal small bowel biopsies -Colonoscopy (01/15/2010): Normal.  Rectal hyperplastic polyps.  Repeat 10 years -09/16/2011: Ferritin 6 -CT enterography (09/2011): Distal ileum with several short segments of bowel wall thickening and mucosal enhancement (ileitis).  No obstruction -EGD (02/16/2016, Dr. Howell Rucks): Normal.  Biopsies negative for celiac -Colonoscopy (02/16/2016, Dr. Howell Rucks): Shallow erosion at IC valve (biopsy: Nonspecific ulcer), Otherwise normal colon.  Unable to intubate IC valve and examined terminal ileum - 07/2020: Ferritin <4, iron 29, TIBC 474, sat 6%.  H/H 8.8/29.9 with MCV/RDW 86/30.7. -Colonoscopy (11/12/2020, Dr. Bryan Lemma): nonbleeding grade 1 internal hemorrhoids, moderate stenosis of the ileocecal valve which was eventually traversed, second stenosis approximately 2 cm proximal to the ICV which could not be traversed, visualized mucosa in the terminal ileum was erythematous and with ulcer x2; pathology showed no active or chronic inflammatory changes to support or suggest Crohn's disease - EGD (11/12/2020,  Dr. Bryan Lemma): LA grade A reflux esophagitis and gastritis; placed on pantoprazole 40 twice a day and Carafate; pathology showed mild gastritis with no evidence of H. pylori - Hospital admission 03/2021 for small bowel obstruction - CT (04/04/2021): Fluid-filled mildly dilated distal small bowel with mucosal thickening and stricturing at the terminal ileum suggesting partial obstruction at this location.  Possible duodenal ulcer - MR Enterography (04/07/2021): Persistent wall thickening and enhancement of the distal and terminal ileum.  Suspect stricture narrowing likely from chronic inflammation (chronic underlying scarring change with fibrosis) plus active inflammation causing combination of mild partial functional mechanical obstruction. - 04/2021: IV iron - 04/20/2021: GI follow-up: Was feeling better.  Reports only taking the budesonide 9 mg/day for couple days, then reduce to 6 mg/day for a couple days, and was at 3 mg/day at that time due to concerns about medication induced symptoms. - 05/05/2021: Case discussed at GI multidisciplinary conference.  Higher suspicion for stricturing Crohn's Disease rather than NSAID induced since she has very rare NSAID use since age 6 and none in the last 10-12 years.  Imaging with 10+ centimeter stricture in the distal ileum with some superimposed inflammatory changes.  Recommended trial of prednisone but patient declined. - Evaluated by Dr. Hester Mates at [facility]. - 05/26/2021: Underwent right hemicolectomy and 16 cm of ileal resection at [facility].  Pathology suspicious for Crohn's Disease. - 07/27/2021: Follow-up in GI clinic.  Had mild flare approximately 6 weeks post op.  Had restarted budesonide 4.5 mg/day with improvement. - 12/21/2021: ER evaluation for palpitations, nausea, lower abdominal pain, rectal bleeding.  H/H at baseline, normal WBC, CMP, INR.  FOBT positive.  CRP 2.3, normal ESR.  Calprotectin 320.  CT with diffuse wall thickening and mucosal  enhancement in the sigmoid without obstruction.  Recommended repeat  CT in 3 months.  Treated with prednisone.  Again recommended Entyvio or other biologic therapy.  Declined.     Separately, reflux well controlled since reducing Protonix to 20 mg/day.  HPI:     Patient is a 61 y.o. female presenting to the Gastroenterology Clinic for follow-up.  Last seen by me on 12/23/21 after ER evaluation on 12/21/2021.  She was discharged from the ER with prednisone 40 mg/day.  At time of follow-up appointment, she was feeling well back to baseline 2 BM/week.  She was again apprehensive about initiating biologic therapy, but wanted to read more about Entyvio.  Had recommended repeat ESR, CRP, calprotectin.  Repeat CRP and ESR both normal.  Repeat calprotectin on 01/26/2022 was normal at 31.    She has since weaned off steroids. Started Budesonide 9 mg/day for a few days, and has been on 6 mg/day for the last month or so with good response.   Did not feel well on steroids (jittery, poor sleep, etc).   Today, states she has felt well for last 2+ weeks or so. Good PO intake.     Review of systems:     No chest pain, no SOB, no fevers, no urinary sx   Past Medical History:  Diagnosis Date   Anxiety    Breast mass, right    Chronic GI bleeding 05/18/3709   Complication of anesthesia    Depression    Dysrhythmia    PVC,s, atrial tachycardia   Gastric ulcer    Hiatal hernia    Iron deficiency anemia 11/07/2011   Irregular heart rate    PONV (postoperative nausea and vomiting)    SBO (small bowel obstruction) ([place])    in 2 different places per patient     Patient's surgical history, family medical history, social history, medications and allergies were all reviewed in Epic    Current Outpatient Medications  Medication Sig Dispense Refill   acetaminophen (TYLENOL) 500 MG tablet Take 500 mg by mouth every 6 (six) hours as needed for mild pain.     ALPRAZolam (XANAX) 0.5 MG tablet Take 0.25 mg by  mouth daily as needed for anxiety.      cholecalciferol (VITAMIN D) 1000 units tablet Take 1,000 Units by mouth daily as needed.      cyclobenzaprine (FLEXERIL) 10 MG tablet Take 5-10 mg by mouth 3 (three) times daily. (Patient not taking: Reported on 12/23/2021)     eszopiclone (LUNESTA) 2 MG TABS tablet Take 2 mg by mouth at bedtime as needed.     metoprolol succinate (TOPROL-XL) 25 MG 24 hr tablet TAKE 1 TABLET BY MOUTH EVERY DAY 90 tablet 2   vitamin B-12 (CYANOCOBALAMIN) 1000 MCG tablet Take 1,000 mcg by mouth daily as needed.      No current facility-administered medications for this visit.    Physical Exam:     There were no vitals taken for this visit.  GENERAL:  Pleasant female in NAD PSYCH: : Cooperative, normal affect Musculoskeletal:  Normal muscle tone, normal strength NEURO: Alert and oriented x 3, no focal neurologic deficits   IMPRESSION and PLAN:    1) Crohn's Disease 2) History of right hemicolectomy and distal ileal resection 62 year old female with stricturing type ileal Crohn's Disease. Diagnosis established at the time of ileal resection for chronic, obstructing stricture.  No prior evidence of colonic disease, but most recent CT in January demonstrates left-sided wall thickening in the sigmoid colon, suspicious for colonic Crohn's Disease.    We  again discussed her Crohn's Disease at length.  We discussed medication options at length.  Ultimately, she does not want to proceed with any escalation of therapy, mostly for concerns about potential side effects (chiefly, she has a history of SVT, palpitations and concern wrap provoking cardiac symptoms).  We additionally had a long conversation about prolonged use of steroids (budesonide).  She understands the risk/benefit profile well and very strongly would like to proceed with continued budesonide for the time being.  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 - Avoid exacerbating foods - trial of Bentyl 10 mg prn  for abdominal cramping  4) Hepatic lesion on CT - Incidentally noted 13 mm low-density structure in the right lobe of the liver - Order MRI liver at follow-up for further characterization and stability  5) Iron deficiency anemia - Continue follow-up in the Hematology clinic as scheduled  6) GERD with erosive esophagitis - Well-controlled - Continue antireflux lifestyle/dietary modifications   RTC in 6 months or sooner prn  I spent 35 minutes of time, including in depth chart review, independent review of results as outlined above, communicating results with the patient directly, face-to-face time with the patient, coordinating care, and ordering studies and medications as appropriate, and documentation.            Lavena Bullion ,DO, FACG 02/11/2022, 11:33 AM

## 2022-03-17 ENCOUNTER — Encounter: Payer: Self-pay | Admitting: Hematology & Oncology

## 2022-03-29 ENCOUNTER — Inpatient Hospital Stay: Payer: BC Managed Care – PPO

## 2022-03-30 ENCOUNTER — Inpatient Hospital Stay: Payer: BC Managed Care – PPO | Attending: Hematology & Oncology

## 2022-03-30 DIAGNOSIS — K922 Gastrointestinal hemorrhage, unspecified: Secondary | ICD-10-CM | POA: Insufficient documentation

## 2022-03-30 DIAGNOSIS — K5090 Crohn's disease, unspecified, without complications: Secondary | ICD-10-CM | POA: Diagnosis not present

## 2022-03-30 DIAGNOSIS — D500 Iron deficiency anemia secondary to blood loss (chronic): Secondary | ICD-10-CM | POA: Diagnosis present

## 2022-03-30 DIAGNOSIS — Z79899 Other long term (current) drug therapy: Secondary | ICD-10-CM | POA: Diagnosis not present

## 2022-03-30 DIAGNOSIS — N6091 Unspecified benign mammary dysplasia of right breast: Secondary | ICD-10-CM | POA: Insufficient documentation

## 2022-03-30 LAB — CBC WITH DIFFERENTIAL (CANCER CENTER ONLY)
Abs Immature Granulocytes: 0.06 10*3/uL (ref 0.00–0.07)
Basophils Absolute: 0.1 10*3/uL (ref 0.0–0.1)
Basophils Relative: 1 %
Eosinophils Absolute: 0 10*3/uL (ref 0.0–0.5)
Eosinophils Relative: 1 %
HCT: 32.1 % — ABNORMAL LOW (ref 36.0–46.0)
Hemoglobin: 9.7 g/dL — ABNORMAL LOW (ref 12.0–15.0)
Immature Granulocytes: 1 %
Lymphocytes Relative: 25 %
Lymphs Abs: 1.4 10*3/uL (ref 0.7–4.0)
MCH: 27.5 pg (ref 26.0–34.0)
MCHC: 30.2 g/dL (ref 30.0–36.0)
MCV: 90.9 fL (ref 80.0–100.0)
Monocytes Absolute: 0.4 10*3/uL (ref 0.1–1.0)
Monocytes Relative: 7 %
Neutro Abs: 3.8 10*3/uL (ref 1.7–7.7)
Neutrophils Relative %: 65 %
Platelet Count: 260 10*3/uL (ref 150–400)
RBC: 3.53 MIL/uL — ABNORMAL LOW (ref 3.87–5.11)
RDW: 14.4 % (ref 11.5–15.5)
WBC Count: 5.7 10*3/uL (ref 4.0–10.5)
nRBC: 0 % (ref 0.0–0.2)

## 2022-03-30 LAB — CMP (CANCER CENTER ONLY)
ALT: 17 U/L (ref 0–44)
AST: 16 U/L (ref 15–41)
Albumin: 4.4 g/dL (ref 3.5–5.0)
Alkaline Phosphatase: 51 U/L (ref 38–126)
Anion gap: 7 (ref 5–15)
BUN: 14 mg/dL (ref 8–23)
CO2: 31 mmol/L (ref 22–32)
Calcium: 9.4 mg/dL (ref 8.9–10.3)
Chloride: 103 mmol/L (ref 98–111)
Creatinine: 0.8 mg/dL (ref 0.44–1.00)
GFR, Estimated: >60 mL/min (ref >60)
Glucose, Bld: 92 mg/dL (ref 70–99)
Potassium: 3.3 mmol/L — ABNORMAL LOW (ref 3.5–5.1)
Sodium: 141 mmol/L (ref 135–145)
Total Bilirubin: 0.3 mg/dL (ref 0.3–1.2)
Total Protein: 6.7 g/dL (ref 6.5–8.1)

## 2022-03-30 LAB — RETICULOCYTES
Immature Retic Fract: 17.2 % — ABNORMAL HIGH (ref 2.3–15.9)
RBC.: 3.5 MIL/uL — ABNORMAL LOW (ref 3.87–5.11)
Retic Count, Absolute: 60.6 10*3/uL (ref 19.0–186.0)
Retic Ct Pct: 1.7 % (ref 0.4–3.1)

## 2022-03-30 LAB — FERRITIN: Ferritin: 5 ng/mL — ABNORMAL LOW (ref 11–307)

## 2022-03-31 ENCOUNTER — Telehealth: Payer: Self-pay

## 2022-03-31 LAB — IRON AND IRON BINDING CAPACITY (CC-WL,HP ONLY)
Iron: 35 ug/dL (ref 28–170)
Saturation Ratios: 8 % — ABNORMAL LOW (ref 10.4–31.8)
TIBC: 463 ug/dL — ABNORMAL HIGH (ref 250–450)
UIBC: 428 ug/dL (ref 148–442)

## 2022-03-31 NOTE — Telephone Encounter (Signed)
Called patient with iron results, she states she switched insurance companies and wanted to make sure she could get the same iron. Message sent to  Ross Ludwig to see if she needs a new prior auth for her iron. Will contact pt to schedule once approved.  ?

## 2022-03-31 NOTE — Telephone Encounter (Signed)
-----   Message from [name], MD sent at 03/31/2022  6:17 AM EDT ----- ?Call - the iron is very low again!!!  She will need some more IV iron. Please set up!!  Carolyn Chaney ?

## 2022-04-01 ENCOUNTER — Telehealth: Payer: Self-pay | Admitting: Hematology & Oncology

## 2022-04-01 NOTE — Telephone Encounter (Signed)
Called to schedule 1 dose of iron per 4/20 sch msg (Injectefer), left voicemail  ?

## 2022-04-05 ENCOUNTER — Encounter: Payer: Self-pay | Admitting: Hematology & Oncology

## 2022-04-05 ENCOUNTER — Inpatient Hospital Stay: Payer: BC Managed Care – PPO | Admitting: Hematology & Oncology

## 2022-04-05 VITALS — BP 141/84 | HR 110 | Temp 97.9°F | Resp 18 | Ht 64.0 in | Wt 121.0 lb

## 2022-04-05 DIAGNOSIS — D500 Iron deficiency anemia secondary to blood loss (chronic): Secondary | ICD-10-CM | POA: Diagnosis not present

## 2022-04-05 NOTE — Progress Notes (Signed)
?Hematology and Oncology Follow Up Visit ? ?Carolyn Chaney ?[id number] ?28-Mar-1961 60 y.o. ?04/05/2022 ? ? ?Principle Diagnosis:  ?Iron deficiency anemia ?Ductal Hyperplasia of the RIGHT  ?Crohn's Disease      ?  ?Current Therapy:  ?IV iron as indicated  -- Monoferric -- allergic to Venofer/Ferrlicit/Feraheme ?Right breast lumpectomy with radioactive seed localization on 10/09/2019 ?  ?Interim History:  Carolyn Chaney is here today for follow-up.  As expected, her iron is low again.  Her ferritin is 5 with an iron saturation of only 8%.  Her hemoglobin is 9.7. ? ?She does not want to take Injectafer.  She has been doing a lot of reading on Injectafer and feels that she is at high risk for side effects.  As such, she says she is willing to pay for Monoferric.  I told her we could certainly do that.  Again, I am not sure if we will be able to give her the Monoferric with her pain forward.  I would think we would be able to do this. ? ?We will have to see if the insurance company might be able to cover this. ? ?Again, she is very nervous over taking Injectafer because of her reading on all of the side effects. ? ?Thankfully, she has had no problems with the Crohn's.  She I think was having a flare back in January.  She is on steroids.  She is on lower dose steroids right now.  This is causing some ecchymoses. ? ?She has had no problems with diarrhea. ? ?She has had no nausea or vomiting. ? ?Overall, I would have said that her performance status is probably ECOG 1.      ? ? ?Medications:  ?Allergies as of 04/05/2022   ? ?   Reactions  ? Nsaids Other (See Comments)  ? Bleeding  ? Other   ? Laxatives and fleet emema as well and antihistamines  ? Venofer [iron Sucrose] Other (See Comments)  ? Syncope, Numbness and tingling in arms and hands.  ? Zofran [ondansetron]   ? malice  ? Zithromax [azithromycin]   ? ?  ? ?  ?Medication List  ?  ? ?  ? Accurate as of April 05, 2022 10:23 AM. If you have any questions, ask your nurse or  doctor.  ?  ?  ? ?  ? ?acetaminophen 500 MG tablet ?Commonly known as: TYLENOL ?Take 500 mg by mouth every 6 (six) hours as needed for mild pain. ?  ?ALPRAZolam 0.5 MG tablet ?Commonly known as: Duanne Moron ?Take 0.25 mg by mouth daily as needed for anxiety. ?  ?budesonide 3 MG 24 hr capsule ?Commonly known as: ENTOCORT EC ?Take 6 mg by mouth daily. ?  ?cholecalciferol 1000 units tablet ?Commonly known as: VITAMIN D ?Take 1,000 Units by mouth daily as needed. ?  ?cyclobenzaprine 10 MG tablet ?Commonly known as: FLEXERIL ?Take 5-10 mg by mouth 3 (three) times daily. ?  ?dicyclomine 10 MG capsule ?Commonly known as: BENTYL ?Take 1 capsule (10 mg total) by mouth every 6 (six) hours as needed for spasms. ?  ?eszopiclone 2 MG Tabs tablet ?Commonly known as: LUNESTA ?Take 2 mg by mouth at bedtime as needed. ?  ?metoprolol succinate 25 MG 24 hr tablet ?Commonly known as: TOPROL-XL ?TAKE 1 TABLET BY MOUTH EVERY DAY ?  ?vitamin B-12 1000 MCG tablet ?Commonly known as: CYANOCOBALAMIN ?Take 1,000 mcg by mouth daily as needed. ?  ? ?  ? ? ?Allergies:  ?Allergies  ?Allergen  Reactions  ? Nsaids Other (See Comments)  ?  Bleeding ?  ? Other   ?  Laxatives and fleet emema as well and antihistamines  ? Venofer [Iron Sucrose] Other (See Comments)  ?  Syncope, Numbness and tingling in arms and hands.  ? Zofran [Ondansetron]   ?  malice  ? Zithromax [Azithromycin]   ? ? ?Past Medical History, Surgical history, Social history, and Family History were reviewed and updated. ? ?Review of Systems: ?Review of Systems  ?Constitutional:  Positive for weight loss.  ?HENT: Negative.    ?Eyes: Negative.   ?Respiratory: Negative.    ?Cardiovascular: Negative.   ?Gastrointestinal:  Positive for abdominal pain and nausea.  ?Genitourinary: Negative.   ?Musculoskeletal: Negative.   ?Skin: Negative.   ?Neurological: Negative.   ?Endo/Heme/Allergies: Negative.   ?Psychiatric/Behavioral: Negative.    ? ? ?Physical Exam: ? height is 5' 4"  (1.626 m) and weight is  121 lb (54.9 kg). Her oral temperature is 97.9 ?F (36.6 ?C). Her blood pressure is 141/84 (abnormal) and her pulse is 110 (abnormal). Her respiration is 18 and oxygen saturation is 99%.  ? ?Wt Readings from Last 3 Encounters:  ?04/05/22 121 lb (54.9 kg)  ?02/11/22 124 lb 4 oz (56.4 kg)  ?12/23/21 126 lb 4 oz (57.3 kg)  ? ? ?Physical Exam ?Vitals reviewed.  ?HENT:  ?   Head: Normocephalic and atraumatic.  ?Eyes:  ?   Pupils: Pupils are equal, round, and reactive to light.  ?Cardiovascular:  ?   Rate and Rhythm: Normal rate and regular rhythm.  ?   Heart sounds: Normal heart sounds.  ?Pulmonary:  ?   Effort: Pulmonary effort is normal.  ?   Breath sounds: Normal breath sounds.  ?Abdominal:  ?   General: Bowel sounds are normal.  ?   Palpations: Abdomen is soft.  ?   Comments: Abdominal exam is soft.  Bowel sounds are present.  She has a healed laparoscopy scar just inferior to the umbilicus.  There is some tenderness above the umbilicus to palpation.  Again there is no fluid wave.  There is no palpable liver or spleen tip.  ?Musculoskeletal:     ?   General: No tenderness or deformity. Normal range of motion.  ?   Cervical back: Normal range of motion.  ?Lymphadenopathy:  ?   Cervical: No cervical adenopathy.  ?Skin: ?   General: Skin is warm and dry.  ?   Findings: No erythema or rash.  ?Neurological:  ?   Mental Status: She is alert and oriented to person, place, and time.  ?Psychiatric:     ?   Behavior: Behavior normal.     ?   Thought Content: Thought content normal.     ?   Judgment: Judgment normal.  ? ? ?Lab Results  ?Component Value Date  ? WBC 5.7 03/30/2022  ? HGB 9.7 (L) 03/30/2022  ? HCT 32.1 (L) 03/30/2022  ? MCV 90.9 03/30/2022  ? PLT 260 03/30/2022  ? ?Lab Results  ?Component Value Date  ? FERRITIN 5 (L) 03/30/2022  ? IRON 35 03/30/2022  ? TIBC 463 (H) 03/30/2022  ? UIBC 428 03/30/2022  ? IRONPCTSAT 8 (L) 03/30/2022  ? ?Lab Results  ?Component Value Date  ? RETICCTPCT 1.7 03/30/2022  ? RBC 3.53 (L)  03/30/2022  ? RBC 3.50 (L) 03/30/2022  ? RETICCTABS 29.25 (L) 03/08/2017  ? ?No results found for: KPAFRELGTCHN, LAMBDASER, KAPLAMBRATIO ?No results found for: IGGSERUM, IGA, IGMSERUM ?No results  found for: TOTALPROTELP, ALBUMINELP, A1GS, A2GS, BETS, BETA2SER, GAMS, MSPIKE, SPEI ?  Chemistry   ?   ?Component Value Date/Time  ? NA 141 03/30/2022 1139  ? K 3.3 (L) 03/30/2022 1139  ? CL 103 03/30/2022 1139  ? CO2 31 03/30/2022 1139  ? BUN 14 03/30/2022 1139  ? CREATININE 0.80 03/30/2022 1139  ?    ?Component Value Date/Time  ? CALCIUM 9.4 03/30/2022 1139  ? ALKPHOS 51 03/30/2022 1139  ? AST 16 03/30/2022 1139  ? ALT 17 03/30/2022 1139  ? BILITOT 0.3 03/30/2022 1139  ?  ? ? ? ?Impression and Plan: Carolyn Chaney is a very pleasant 61 yo caucasian female with history of iron deficiency anemia secondary to intermittent GI blood loss as well as ductal hyperplasia of the right breast last fall treated with lumpectomy.  ? ?Hopefully, we will be able to give her the Monoferric.  We will try to get this for her.  We will have to see if this can be done.  Again, if insurance will cover it, she said that she will pay for herself.  She would like to have peace of mind when she gets the Monoferric. ? ?I will make sure she gets premedicated.  She is already on some steroids. ? ?I will have to get her back probably around [facility] so we see her thing looks. ? ?[name], MD ?4/25/202310:23 AM ? ?

## 2022-04-07 ENCOUNTER — Encounter: Payer: Self-pay | Admitting: Hematology & Oncology

## 2022-04-09 ENCOUNTER — Other Ambulatory Visit: Payer: Self-pay | Admitting: Gastroenterology

## 2022-04-29 DIAGNOSIS — M8589 Other specified disorders of bone density and structure, multiple sites: Secondary | ICD-10-CM | POA: Diagnosis not present

## 2022-05-03 ENCOUNTER — Inpatient Hospital Stay: Payer: BC Managed Care – PPO | Attending: Hematology & Oncology

## 2022-05-03 VITALS — BP 114/74 | HR 72 | Temp 97.6°F | Resp 18

## 2022-05-03 DIAGNOSIS — K922 Gastrointestinal hemorrhage, unspecified: Secondary | ICD-10-CM | POA: Insufficient documentation

## 2022-05-03 DIAGNOSIS — D500 Iron deficiency anemia secondary to blood loss (chronic): Secondary | ICD-10-CM | POA: Insufficient documentation

## 2022-05-03 MED ORDER — FAMOTIDINE 20 MG IN NS 100 ML IVPB: 40.0000 mg | Freq: Once | INTRAVENOUS | Status: DC

## 2022-05-03 MED ORDER — SODIUM CHLORIDE 0.9 % IV SOLN
40.0000 mg | Freq: Once | INTRAVENOUS | Status: AC
  Administered 2022-05-03: 40 mg via INTRAVENOUS
  Filled 2022-05-03: qty 4

## 2022-05-03 MED ORDER — ACETAMINOPHEN 325 MG PO TABS
650.0000 mg | ORAL_TABLET | Freq: Once | ORAL | Status: AC
  Administered 2022-05-03: 650 mg via ORAL
  Filled 2022-05-03: qty 2

## 2022-05-03 MED ORDER — SODIUM CHLORIDE 0.9 % IV SOLN
1000.0000 mg | Freq: Once | INTRAVENOUS | Status: AC
  Administered 2022-05-03: 1000 mg via INTRAVENOUS
  Filled 2022-05-03: qty 10

## 2022-05-03 MED ORDER — SODIUM CHLORIDE 0.9 % IV SOLN: INTRAVENOUS | Status: DC

## 2022-05-03 MED ORDER — METHYLPREDNISOLONE SODIUM SUCC 125 MG IJ SOLR
80.0000 mg | Freq: Once | INTRAMUSCULAR | Status: AC
  Administered 2022-05-03: 80 mg via INTRAVENOUS
  Filled 2022-05-03: qty 2

## 2022-05-03 MED ORDER — LORATADINE 10 MG PO TABS
10.0000 mg | ORAL_TABLET | Freq: Once | ORAL | Status: AC
  Administered 2022-05-03: 10 mg via ORAL
  Filled 2022-05-03: qty 1

## 2022-05-03 NOTE — Progress Notes (Signed)
At 1254 patient stated: "I feel a little bit weird in my head". Asked her to clarify what exactly she means by that, and she said that she's "going to  be fine". Vital signs within normal limits. Will monitor 30 minutes post iron infusion. Pt. States that she's feeling fine upon discharge.

## 2022-05-03 NOTE — Patient Instructions (Signed)
Ferric Derisomaltose Injection What is this medication? FERRIC DERISOMALTOSE (FER ik der EYE soe MAWL tose) treats low levels of iron in your body (iron deficiency anemia). Iron is a mineral that plays an important role in making red blood cells, which carry oxygen from your lungs to the rest of your body. This medicine may be used for other purposes; ask your health care provider or pharmacist if you have questions. COMMON BRAND NAME(S): MONOFERRIC What should I tell my care team before I take this medication? They need to know if you have any of these conditions: High levels of iron in the blood An unusual or allergic reaction to iron, other medications, foods, dyes, or preservatives Pregnant or trying to get pregnant Breast-feeding How should I use this medication? This medication is for injection into a vein. It is given in a hospital or clinic setting. Talk to your care team about the use of this medication in children. Special care may be needed. Overdosage: If you think you have taken too much of this medicine contact a poison control center or emergency room at once. NOTE: This medicine is only for you. Do not share this medicine with others. What if I miss a dose? It is important not to miss your dose. Call your care team if you are unable to keep an appointment. What may interact with this medication? Do not take this medication with any of the following: Deferoxamine Dimercaprol Other iron products This list may not describe all possible interactions. Give your health care provider a list of all the medicines, herbs, non-prescription drugs, or dietary supplements you use. Also tell them if you smoke, drink alcohol, or use illegal drugs. Some items may interact with your medicine. What should I watch for while using this medication? Visit your care team regularly. Tell your care team if your symptoms do not start to get better or if they get worse. You may need blood work done  while you are taking this medication. You may need to follow a special diet. Talk to your care team. Foods that contain iron include whole grains/cereals, dried fruits, beans, or peas, leafy green vegetables, and organ meats (liver, kidney). What side effects may I notice from receiving this medication? Side effects that you should report to your care team as soon as possible: Allergic reactions--skin rash, itching, hives, swelling of the face, lips, tongue, or throat Low blood pressure--dizziness, feeling faint or lightheaded, blurry vision Shortness of breath Side effects that usually do not require medical attention (report to your care team if they continue or are bothersome): Flushing Headache Joint pain Muscle pain Nausea Pain, redness, or irritation at injection site This list may not describe all possible side effects. Call your doctor for medical advice about side effects. You may report side effects to FDA at 1-800-FDA-1088. Where should I keep my medication? This medication is given in a hospital or clinic and will not be stored at home. NOTE: This sheet is a summary. It may not cover all possible information. If you have questions about this medicine, talk to your doctor, pharmacist, or health care provider.  2023 Elsevier/Gold Standard (2021-04-23 00:00:00)

## 2022-05-04 ENCOUNTER — Other Ambulatory Visit: Payer: BC Managed Care – PPO

## 2022-05-04 DIAGNOSIS — K50919 Crohn's disease, unspecified, with unspecified complications: Secondary | ICD-10-CM

## 2022-05-04 DIAGNOSIS — K589 Irritable bowel syndrome without diarrhea: Secondary | ICD-10-CM | POA: Diagnosis not present

## 2022-05-16 ENCOUNTER — Telehealth: Payer: Self-pay | Admitting: Gastroenterology

## 2022-05-16 NOTE — Telephone Encounter (Signed)
Patient has an appt scheduled 6/15 with Dr. Bryan Lemma; however, she has to move her daughter to [place] that day.  She is requesting something either the 7-14 or after the 19th.  In checking his schedule, he doesn't have anything until July and with her Crohn' she doesn't want to wait that long.  Please call her back and let her know if we can accommodate her.  Thank you.

## 2022-05-17 DIAGNOSIS — R002 Palpitations: Secondary | ICD-10-CM | POA: Insufficient documentation

## 2022-05-18 ENCOUNTER — Ambulatory Visit: Payer: BC Managed Care – PPO | Admitting: Internal Medicine

## 2022-05-18 ENCOUNTER — Encounter: Payer: Self-pay | Admitting: Internal Medicine

## 2022-05-18 ENCOUNTER — Ambulatory Visit (INDEPENDENT_AMBULATORY_CARE_PROVIDER_SITE_OTHER): Payer: BC Managed Care – PPO

## 2022-05-18 VITALS — BP 118/70 | HR 76 | Ht 64.0 in | Wt 120.2 lb

## 2022-05-18 DIAGNOSIS — I471 Supraventricular tachycardia: Secondary | ICD-10-CM

## 2022-05-18 DIAGNOSIS — R002 Palpitations: Secondary | ICD-10-CM | POA: Diagnosis not present

## 2022-05-18 LAB — BASIC METABOLIC PANEL
BUN/Creatinine Ratio: 17 (ref 12–28)
BUN: 12 mg/dL (ref 8–27)
CO2: 26 mmol/L (ref 20–29)
Calcium: 9.5 mg/dL (ref 8.7–10.3)
Chloride: 103 mmol/L (ref 96–106)
Creatinine, Ser: 0.7 mg/dL (ref 0.57–1.00)
Glucose: 88 mg/dL (ref 70–99)
Potassium: 3.8 mmol/L (ref 3.5–5.2)
Sodium: 143 mmol/L (ref 134–144)
eGFR: 98 mL/min/{1.73_m2} (ref >59)

## 2022-05-18 LAB — CALPROTECTIN, FECAL: Calprotectin, Fecal: 48 ug/g (ref 0–120)

## 2022-05-18 NOTE — Telephone Encounter (Signed)
Spoke with pt and gave pt Dr. Vivia Ewing message. Pt verbalized understanding and had no other concerns at end of call.

## 2022-05-18 NOTE — Progress Notes (Signed)
Patient Care Team: [name], MD as PCP - General (Internal Medicine) Deboraha Sprang, MD as PCP - Cardiology (Cardiology) Deboraha Sprang, MD as PCP - Electrophysiology (Cardiology)   HPI  Carolyn Chaney is a 61 y.o. female seen in follow-up for palpitations associated with atrial tachycardia. She saw Dr. Lovena Le for consideration of ablation and elected to pursue therapy with flecainide--that was forsaken and she now takes low dose metoprolol with great benefit  She had concern about coronary disease --  CaScore 0   Records and Results Reviewed hospital records from recent endoscopy undertaken for Fe deficiency anemia in setting of Crohns disease identified 4/22 hospitalized at that time for an obstruction.  Ultimately underwent surgery at [facility] and is currently taking Budesinide  Has continued to have bowel problems.  Palpitations with many stimuli, headaches, GI issues, exertion, hot baths or showers.  Some orthostatic lightheadedness.  anxiety  Hyperlipidemia but familial intolerance of statins     Date Cr K Hgb  11/22 0.81 4.6 11.1<<9.6  4/23 0.8 3.3 9.7      DATE TEST EF   3/22 CaScore (CE)    % Score=0                    Past Medical History:  Diagnosis Date   Anxiety    Breast mass, right    Chronic GI bleeding 3/57/0177   Complication of anesthesia    Depression    Dysrhythmia    PVC,s, atrial tachycardia   Gastric ulcer    Hiatal hernia    Iron deficiency anemia 11/07/2011   Irregular heart rate    PONV (postoperative nausea and vomiting)    SBO (small bowel obstruction) ([place])    in 2 different places per patient     Past Surgical History:  Procedure Laterality Date   APPENDECTOMY     BOWEL RESECTION  05/26/2021   BREAST EXCISIONAL BIOPSY Right 1980   Benign   BREAST EXCISIONAL BIOPSY Right 2020   BREAST LUMPECTOMY WITH RADIOACTIVE SEED LOCALIZATION Right 10/09/2019   Procedure: RIGHT BREAST LUMPECTOMY WITH RADIOACTIVE SEED  LOCALIZATION;  Surgeon: Fanny Skates, MD;  Location: [facility];  Service: General;  Laterality: Right;   BREAST SURGERY     x2   COLONOSCOPY WITH PROPOFOL N/A 02/16/2016   Procedure: COLONOSCOPY WITH PROPOFOL;  Surgeon: Garlan Fair, MD;  Location: [facility];  Service: Endoscopy;  Laterality: N/A;   ESOPHAGOGASTRODUODENOSCOPY (EGD) WITH PROPOFOL N/A 02/16/2016   Procedure: ESOPHAGOGASTRODUODENOSCOPY (EGD) WITH PROPOFOL;  Surgeon: Garlan Fair, MD;  Location: [facility];  Service: Endoscopy;  Laterality: N/A;   HERNIA REPAIR     bilateral inguinal hernia as child   TONSILLECTOMY      Current Outpatient Medications  Medication Sig Dispense Refill   acetaminophen (TYLENOL) 500 MG tablet Take 500 mg by mouth every 6 (six) hours as needed for mild pain.     ALPRAZolam (XANAX) 0.5 MG tablet Take 0.25 mg by mouth daily as needed for anxiety.      budesonide (ENTOCORT EC) 3 MG 24 hr capsule TAKE 2 CAPSULES (6 MG TOTAL) BY MOUTH DAILY. 180 capsule 1   cholecalciferol (VITAMIN D) 1000 units tablet Take 1,000 Units by mouth daily as needed.      eszopiclone (LUNESTA) 2 MG TABS tablet Take 2 mg by mouth at bedtime as needed.     metoprolol succinate (TOPROL-XL) 25 MG 24 hr tablet TAKE 1  TABLET BY MOUTH EVERY DAY 90 tablet 2   vitamin B-12 (CYANOCOBALAMIN) 1000 MCG tablet Take 1,000 mcg by mouth daily as needed.      cyclobenzaprine (FLEXERIL) 10 MG tablet Take 5-10 mg by mouth 3 (three) times daily. (Patient not taking: Reported on 05/18/2022)     dicyclomine (BENTYL) 10 MG capsule Take 1 capsule (10 mg total) by mouth every 6 (six) hours as needed for spasms. (Patient not taking: Reported on 05/18/2022) 30 capsule 3   No current facility-administered medications for this visit.    Allergies  Allergen Reactions   Nsaids Other (See Comments)    Bleeding    Other     Laxatives and fleet emema as well and antihistamines   Venofer [Iron Sucrose] Other (See Comments)     Syncope, Numbness and tingling in arms and hands.   Zofran [Ondansetron]     malice   Feraheme [Ferumoxytol] Other (See Comments)    Syncope, Numbness and tingling in arms and hands.   Zithromax [Azithromycin]        Physical Exam BP 118/70   Pulse 76   Ht 5' 4"  (1.626 m)   Wt 120 lb 3.2 oz (54.5 kg)   SpO2 99%   BMI 20.63 kg/m  Well developed and nourished in no acute distress HENT normal Neck supple with JVP-  flat  Clear Regular rate and rhythm, no murmurs or gallops Abd-soft with active BS No Clubbing cyanosis edema Skin-warm and dry A & Oriented  Grossly normal sensory and motor function Repeatedly tearful  ECG sinus at 76 17/10/38 RSR prime Otherwise normal     Assessment and  Plan  Atrial tachycardia  Crohns disease  Fe Def anemia   Orthostatic and heat intolerance  Anxiety   Patient continues with palpitations.  Triggered by many things.  We will use an event recorder to clarify the mechanism.  I wonder whether something like clonidine as opposed to metoprolol might not be more efficacious as a central sympatholytic decreasing response to external triggers.  Her inflammatory bowel disease continues to be problematic with pain.  Significant issues with iron deficiency anemia.  She has felt somewhat better with beginning of iron repletion and a normal hemoglobin may go a long way to help with her other symptoms.  She has borderline orthostatic today.  Blood pressure is on the higher side.  Not withstanding, we also encouraged her to increase her sodium intake with up to about 1.5 g a day   Suggested also "heart care" given her anxiety and have suggested [facility]

## 2022-05-18 NOTE — Progress Notes (Unsigned)
Enrolled for [employer] to mail a ZIO XT long term holter monitor to the patients address on file.  

## 2022-05-18 NOTE — Telephone Encounter (Signed)
Spoke with Carolyn Chaney. Carolyn Chaney stated that she wanted an appt on a different date in June because of a scheduling issue for 6/15. Asked Carolyn Chaney if she was having any symptoms or why she was having an appt in June and Carolyn Chaney stated that she had thought Dr. Bryan Lemma had told her to follow up in 3 months from last appt. Let Carolyn Chaney know that I see where is said complete labs in 3 months but last office visit said to follow up in 6 months. Asked Carolyn Chaney if she is having any symptoms and Carolyn Chaney stated about 2-3 weeks ago she started having diarrhea so she increased her budesonide from 6 mg to 9 mg for 1 week and symptoms resolved so Carolyn Chaney dropped back down to 6 mg. Carolyn Chaney also mentioned she got a headache when she was on the 9 mg and was worried this could have been the budesonide. Carolyn Chaney states she currently has 3 formed bm's per week. Carolyn Chaney also wanted to know results of fecal calprotectin that she went to lab for while she was having the diarrhea. Let Carolyn Chaney know Dr. Vivia Ewing message about fecal calprotectin. Asked Carolyn Chaney if she wanted to be seen sooner or later and Carolyn Chaney stated she wanted to be seen in September if that's when she was supposed to follow up. Carolyn Chaney scheduled for f/u on 8/31 at 11:20 am. Is that okay and also does Carolyn Chaney need to return to lab for ESR and CRP?

## 2022-05-18 NOTE — Patient Instructions (Addendum)
Medication Instructions:  Your physician recommends that you continue on your current medications as directed. Please refer to the Current Medication list given to you today.  *If you need a refill on your cardiac medications before your next appointment, please call your pharmacy*   Lab Work: BMET today If you have labs (blood work) drawn today and your tests are completely normal, you will receive your results only by: [place] (if you have MyChart) OR A paper copy in the mail If you have any lab test that is abnormal or we need to change your treatment, we will call you to review the results.   Testing/Procedures: [facility] [place]- Long Term Monitor Instructions  Your physician has requested you wear a ZIO patch monitor for 14 days.  This is a single patch monitor. [employer] supplies one patch monitor per enrollment. Additional stickers are not available. Please do not apply patch if you will be having a Nuclear Stress Test,  Echocardiogram, Cardiac CT, MRI, or Chest Xray during the period you would be wearing the  monitor. The patch cannot be worn during these tests. You cannot remove and re-apply the  ZIO XT patch monitor.  Your ZIO patch monitor will be mailed 3 day USPS to your address on file. It may take 3-5 days  to receive your monitor after you have been enrolled.  Once you have received your monitor, please review the enclosed instructions. Your monitor  has already been registered assigning a specific monitor serial # to you.  Billing and Patient Assistance Program Information  We have supplied [employer] with any of your insurance information on file for billing purposes. [employer] offers a sliding scale Patient Assistance Program for patients that do not have  insurance, or whose insurance does not completely cover the cost of the ZIO monitor.  You must apply for the Patient Assistance Program to qualify for this discounted rate.  To apply, please call [employer] at [phone],  select option 4, select option 2, ask to apply for  Patient Assistance Program. Theodore Demark will ask your household income, and how many people  are in your household. They will quote your out-of-pocket cost based on that information.  [employer] will also be able to set up a 34-month interest-free payment plan if needed.  Applying the monitor   Shave hair from upper left chest.  Hold abrader disc by orange tab. Rub abrader in 40 strokes over the upper left chest as  indicated in your monitor instructions.  Clean area with 4 enclosed alcohol pads. Let dry.  Apply patch as indicated in monitor instructions. Patch will be placed under collarbone on left  side of chest with arrow pointing upward.  Rub patch adhesive wings for 2 minutes. Remove white label marked "1". Remove the white  label marked "2". Rub patch adhesive wings for 2 additional minutes.  While looking in a mirror, press and release button in center of patch. A small green light will  flash 3-4 times. This will be your only indicator that the monitor has been turned on.  Do not shower for the first 24 hours. You may shower after the first 24 hours.  Press the button if you feel a symptom. You will hear a small click. Record Date, Time and  Symptom in the Patient Logbook.  When you are ready to remove the patch, follow instructions on the last 2 pages of Patient  Logbook. Stick patch monitor onto the last page of Patient Logbook.  Place Patient Logbook  in the blue and white box. Use locking tab on box and tape box closed  securely. The blue and white box has prepaid postage on it. Please place it in the mailbox as  soon as possible. Your physician should have your test results approximately 7 days after the  monitor has been mailed back to [facility].  Call [facility] at [phone] if you have questions regarding  your ZIO XT patch monitor. Call them immediately if you see an orange light blinking on your   monitor.  If your monitor falls off in less than 4 days, contact our Monitor department at [phone].  If your monitor becomes loose or falls off after 4 days call [employer] at [phone] for  suggestions on securing your monitor    Follow-Up: At [facility], you and your health needs are our priority.  As part of our continuing mission to provide you with exceptional heart care, we have created designated Provider Care Teams.  These Care Teams include your primary Cardiologist (physician) and Advanced Practice Providers (APPs -  Physician Assistants and Nurse Practitioners) who all work together to provide you with the care you need, when you need it.  We recommend signing up for the patient portal called "MyChart".  Sign up information is provided on this After Visit Summary.  MyChart is used to connect with patients for Virtual Visits (Telemedicine).  Patients are able to view lab/test results, encounter notes, upcoming appointments, etc.  Non-urgent messages can be sent to your provider as well.   To learn more about what you can do with MyChart, go to [email or web address].    Your next appointment:   Telephone visit with Dr Caryl Comes.  I will have his scheduler contact you for this appointment.  Discussed salt substitutes with a  goal of 2-3 gms of Sodium or 5-7 gm of sodium Chloride daily.  Rehydration solutions include Liquid IV, NUUN, TriOral, Normralyte pedialyte advanced care and Banana Bags.  Salt tablets include plain salt tablets, SaltStick Vitassium, thermatabs amongst others.   The higher sodium concentration per serving on this list is normalyte about 800 mg of sodium per serving and LMNT1000 mg    Salt supplements are best used with adjunctive sugar   Important Information About Sugar

## 2022-05-18 NOTE — Telephone Encounter (Signed)
Yes, appointment 8/31 is perfectly reasonable.  As her symptoms have improved and the calprotectin was normal, I think it is okay to hold off on the ESR and CRP for the time being.  Thank you.

## 2022-05-24 DIAGNOSIS — R002 Palpitations: Secondary | ICD-10-CM | POA: Diagnosis not present

## 2022-05-24 DIAGNOSIS — I471 Supraventricular tachycardia: Secondary | ICD-10-CM

## 2022-05-26 ENCOUNTER — Ambulatory Visit: Payer: BC Managed Care – PPO | Admitting: Gastroenterology

## 2022-06-02 ENCOUNTER — Inpatient Hospital Stay: Payer: BC Managed Care – PPO | Attending: Hematology & Oncology

## 2022-06-02 ENCOUNTER — Encounter: Payer: Self-pay | Admitting: Hematology & Oncology

## 2022-06-02 ENCOUNTER — Inpatient Hospital Stay: Payer: BC Managed Care – PPO | Admitting: Hematology & Oncology

## 2022-06-02 ENCOUNTER — Other Ambulatory Visit: Payer: Self-pay

## 2022-06-02 ENCOUNTER — Encounter: Payer: Self-pay | Admitting: *Deleted

## 2022-06-02 VITALS — BP 109/81 | HR 74 | Temp 97.8°F | Resp 18 | Ht 64.0 in | Wt 120.0 lb

## 2022-06-02 DIAGNOSIS — K5090 Crohn's disease, unspecified, without complications: Secondary | ICD-10-CM | POA: Insufficient documentation

## 2022-06-02 DIAGNOSIS — K922 Gastrointestinal hemorrhage, unspecified: Secondary | ICD-10-CM | POA: Diagnosis not present

## 2022-06-02 DIAGNOSIS — D500 Iron deficiency anemia secondary to blood loss (chronic): Secondary | ICD-10-CM | POA: Diagnosis not present

## 2022-06-02 LAB — CBC WITH DIFFERENTIAL (CANCER CENTER ONLY)
Abs Immature Granulocytes: 0.01 10*3/uL (ref 0.00–0.07)
Basophils Absolute: 0 10*3/uL (ref 0.0–0.1)
Basophils Relative: 1 %
Eosinophils Absolute: 0 10*3/uL (ref 0.0–0.5)
Eosinophils Relative: 1 %
HCT: 40.5 % (ref 36.0–46.0)
Hemoglobin: 12.5 g/dL (ref 12.0–15.0)
Immature Granulocytes: 0 %
Lymphocytes Relative: 34 %
Lymphs Abs: 1.6 10*3/uL (ref 0.7–4.0)
MCH: 29.1 pg (ref 26.0–34.0)
MCHC: 30.9 g/dL (ref 30.0–36.0)
MCV: 94.4 fL (ref 80.0–100.0)
Monocytes Absolute: 0.4 10*3/uL (ref 0.1–1.0)
Monocytes Relative: 7 %
Neutro Abs: 2.7 10*3/uL (ref 1.7–7.7)
Neutrophils Relative %: 57 %
Platelet Count: 205 10*3/uL (ref 150–400)
RBC: 4.29 MIL/uL (ref 3.87–5.11)
RDW: 18.2 % — ABNORMAL HIGH (ref 11.5–15.5)
WBC Count: 4.7 10*3/uL (ref 4.0–10.5)
nRBC: 0 % (ref 0.0–0.2)

## 2022-06-02 LAB — CMP (CANCER CENTER ONLY)
ALT: 13 U/L (ref 0–44)
AST: 13 U/L — ABNORMAL LOW (ref 15–41)
Albumin: 4.5 g/dL (ref 3.5–5.0)
Alkaline Phosphatase: 57 U/L (ref 38–126)
Anion gap: 7 (ref 5–15)
BUN: 15 mg/dL (ref 8–23)
CO2: 32 mmol/L (ref 22–32)
Calcium: 9.7 mg/dL (ref 8.9–10.3)
Chloride: 103 mmol/L (ref 98–111)
Creatinine: 0.88 mg/dL (ref 0.44–1.00)
GFR, Estimated: >60 mL/min (ref >60)
Glucose, Bld: 108 mg/dL — ABNORMAL HIGH (ref 70–99)
Potassium: 3.9 mmol/L (ref 3.5–5.1)
Sodium: 142 mmol/L (ref 135–145)
Total Bilirubin: 0.4 mg/dL (ref 0.3–1.2)
Total Protein: 7 g/dL (ref 6.5–8.1)

## 2022-06-02 LAB — MAGNESIUM: Magnesium: 2.1 mg/dL (ref 1.7–2.4)

## 2022-06-02 LAB — PHOSPHORUS: Phosphorus: 3.5 mg/dL (ref 2.5–4.6)

## 2022-06-02 LAB — IRON AND IRON BINDING CAPACITY (CC-WL,HP ONLY)
Iron: 111 ug/dL (ref 28–170)
Saturation Ratios: 34 % — ABNORMAL HIGH (ref 10.4–31.8)
TIBC: 323 ug/dL (ref 250–450)
UIBC: 212 ug/dL (ref 148–442)

## 2022-06-02 LAB — FERRITIN: Ferritin: 157 ng/mL (ref 11–307)

## 2022-06-02 NOTE — Progress Notes (Signed)
Hematology and Oncology Follow Up Visit  FRANNY SELVAGE [id number] Feb 04, 1961 61 y.o. 06/02/2022   Principle Diagnosis:  Iron deficiency anemia Ductal Hyperplasia of the RIGHT  Crohn's Disease        Current Therapy:  IV iron as indicated  -- Monoferric -- given on 05/03/2022 Right breast lumpectomy with radioactive seed localization on 10/09/2019   Interim History:  Ms. Kutsch is here today for follow-up.  She is doing a little bit better.  She has a cardiac monitor on now.  She has had this on for about a week or so.  I think she may have some palpitations.  We did give her iron back on May 23.  At that time, her iron levels showed a ferritin of 5 with an iron saturation of 8%.  She feels better.  She has had no problems with her Crohn's.  She is on oral steroids for this.  She does not want to take Biologics for the Crohn's.  She is thankful that her daughter and husband have now moved back to [place].  This makes a lot easier for her.  She has had no problems with cough.  There is been no leg swelling.  She has had no rashes.  She has had no obvious change in bowel or bladder habits.  Overall, I would say performance status is probably ECOG 1.    Medications:  Allergies as of 06/02/2022       Reactions   Nsaids Other (See Comments)   Bleeding   Other    Laxatives and fleet emema as well and antihistamines   Venofer [iron Sucrose] Other (See Comments)   Syncope, Numbness and tingling in arms and hands.   Feraheme [ferumoxytol] Other (See Comments)   Syncope, Numbness and tingling in arms and hands.   Zofran [ondansetron]    malice   Zithromax [azithromycin]         Medication List        Accurate as of June 02, 2022 10:51 AM. If you have any questions, ask your nurse or doctor.          acetaminophen 500 MG tablet Commonly known as: TYLENOL Take 500 mg by mouth every 6 (six) hours as needed for mild pain.   ALPRAZolam 0.5 MG tablet Commonly known  as: XANAX Take 0.25 mg by mouth daily as needed for anxiety.   budesonide 3 MG 24 hr capsule Commonly known as: ENTOCORT EC TAKE 2 CAPSULES (6 MG TOTAL) BY MOUTH DAILY.   cholecalciferol 1000 units tablet Commonly known as: VITAMIN D Take 1,000 Units by mouth daily as needed.   cyclobenzaprine 10 MG tablet Commonly known as: FLEXERIL Take 5-10 mg by mouth 3 (three) times daily.   dicyclomine 10 MG capsule Commonly known as: BENTYL Take 1 capsule (10 mg total) by mouth every 6 (six) hours as needed for spasms.   eszopiclone 2 MG Tabs tablet Commonly known as: LUNESTA Take 2 mg by mouth at bedtime as needed.   metoprolol succinate 25 MG 24 hr tablet Commonly known as: TOPROL-XL TAKE 1 TABLET BY MOUTH EVERY DAY   sertraline 50 MG tablet Commonly known as: ZOLOFT Take 1 tablet by mouth daily.   vitamin B-12 1000 MCG tablet Commonly known as: CYANOCOBALAMIN Take 1,000 mcg by mouth daily as needed.        Allergies:  Allergies  Allergen Reactions   Nsaids Other (See Comments)    Bleeding    Other  Laxatives and fleet emema as well and antihistamines   Venofer [Iron Sucrose] Other (See Comments)    Syncope, Numbness and tingling in arms and hands.   Feraheme [Ferumoxytol] Other (See Comments)    Syncope, Numbness and tingling in arms and hands.   Zofran [Ondansetron]     malice   Zithromax [Azithromycin]     Past Medical History, Surgical history, Social history, and Family History were reviewed and updated.  Review of Systems: Review of Systems  Constitutional:  Positive for weight loss.  HENT: Negative.    Eyes: Negative.   Respiratory: Negative.    Cardiovascular: Negative.   Gastrointestinal:  Positive for abdominal pain and nausea.  Genitourinary: Negative.   Musculoskeletal: Negative.   Skin: Negative.   Neurological: Negative.   Endo/Heme/Allergies: Negative.   Psychiatric/Behavioral: Negative.       Physical Exam:  height is 5' 4"  (1.626  m) and weight is 120 lb (54.4 kg). Her oral temperature is 97.8 F (36.6 C). Her blood pressure is 109/81 and her pulse is 74. Her respiration is 18 and oxygen saturation is 100%.   Wt Readings from Last 3 Encounters:  06/02/22 120 lb (54.4 kg)  05/18/22 120 lb 3.2 oz (54.5 kg)  04/05/22 121 lb (54.9 kg)    Physical Exam Vitals reviewed.  HENT:     Head: Normocephalic and atraumatic.  Eyes:     Pupils: Pupils are equal, round, and reactive to light.  Cardiovascular:     Rate and Rhythm: Normal rate and regular rhythm.     Heart sounds: Normal heart sounds.  Pulmonary:     Effort: Pulmonary effort is normal.     Breath sounds: Normal breath sounds.  Abdominal:     General: Bowel sounds are normal.     Palpations: Abdomen is soft.     Comments: Abdominal exam is soft.  Bowel sounds are present.  She has a healed laparoscopy scar just inferior to the umbilicus.  There is some tenderness above the umbilicus to palpation.  Again there is no fluid wave.  There is no palpable liver or spleen tip.  Musculoskeletal:        General: No tenderness or deformity. Normal range of motion.     Cervical back: Normal range of motion.  Lymphadenopathy:     Cervical: No cervical adenopathy.  Skin:    General: Skin is warm and dry.     Findings: No erythema or rash.  Neurological:     Mental Status: She is alert and oriented to person, place, and time.  Psychiatric:        Behavior: Behavior normal.        Thought Content: Thought content normal.        Judgment: Judgment normal.     Lab Results  Component Value Date   WBC 4.7 06/02/2022   HGB 12.5 06/02/2022   HCT 40.5 06/02/2022   MCV 94.4 06/02/2022   PLT 205 06/02/2022   Lab Results  Component Value Date   FERRITIN 5 (L) 03/30/2022   IRON 35 03/30/2022   TIBC 463 (H) 03/30/2022   UIBC 428 03/30/2022   IRONPCTSAT 8 (L) 03/30/2022   Lab Results  Component Value Date   RETICCTPCT 1.7 03/30/2022   RBC 4.29 06/02/2022    RETICCTABS 29.25 (L) 03/08/2017   No results found for: "KPAFRELGTCHN", "LAMBDASER", "KAPLAMBRATIO" No results found for: "IGGSERUM", "IGA", "IGMSERUM" No results found for: "TOTALPROTELP", "ALBUMINELP", "A1GS", "A2GS", "BETS", "BETA2SER", "GAMS", "MSPIKE", "SPEI"  Chemistry      Component Value Date/Time   NA 142 06/02/2022 1009   NA 143 05/18/2022 1139   K 3.9 06/02/2022 1009   CL 103 06/02/2022 1009   CO2 32 06/02/2022 1009   BUN 15 06/02/2022 1009   BUN 12 05/18/2022 1139   CREATININE 0.88 06/02/2022 1009      Component Value Date/Time   CALCIUM 9.7 06/02/2022 1009   ALKPHOS 57 06/02/2022 1009   AST 13 (L) 06/02/2022 1009   ALT 13 06/02/2022 1009   BILITOT 0.4 06/02/2022 1009       Impression and Plan: Ms. Neubert is a very pleasant 61 yo caucasian female with history of iron deficiency anemia secondary to intermittent GI blood loss as well as ductal hyperplasia of the right breast last fall treated with lumpectomy.   Her active problem clearly is the iron deficiency because the Crohn's disease..  She tolerated Monoferric well.  She cannot tolerate any other iron preparation that we have given her.  As such, Monoferric really should be the only iron that we can give her.  We will see what her iron levels show.  I had to believe that they are doing okay given that her MCV is 94.  I would like to get her back in about 2 months now.  I would like to see her back before the Labor Day holiday.   [name], MD 6/22/202310:51 AM

## 2022-06-03 ENCOUNTER — Encounter: Payer: Self-pay | Admitting: *Deleted

## 2022-06-13 IMAGING — MG MM DIGITAL SCREENING BILAT W/ TOMO AND CAD
8 series · 9 of 24 positions shown · non-contrast
Comparison: Previous exam(s).

CLINICAL DATA: Screening.

EXAM:
DIGITAL SCREENING BILATERAL MAMMOGRAM WITH TOMOSYNTHESIS AND CAD
TECHNIQUE: Bilateral screening digital craniocaudal and mediolateral oblique
mammograms were obtained. Bilateral screening digital breast
tomosynthesis was performed. The images were evaluated with
computer-aided detection.

[L CC synth-2D]
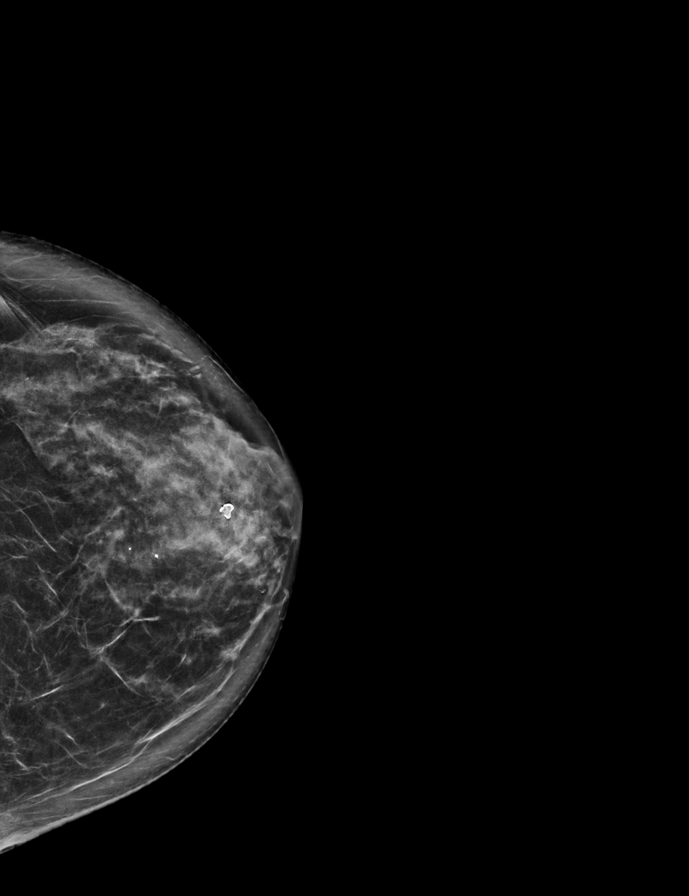

[R CC synth-2D]
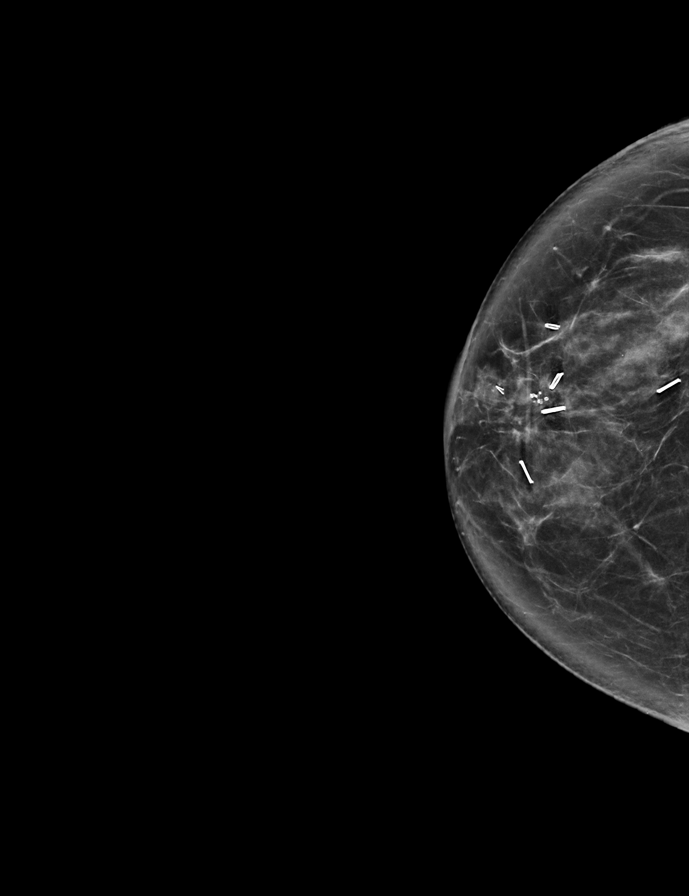

[L MLO synth-2D]
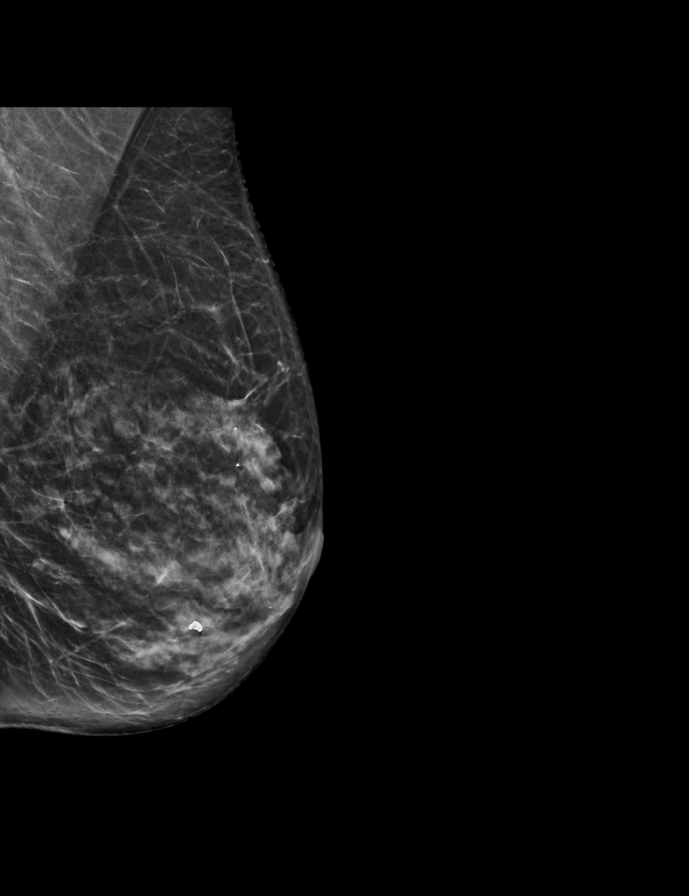

[R MLO synth-2D]
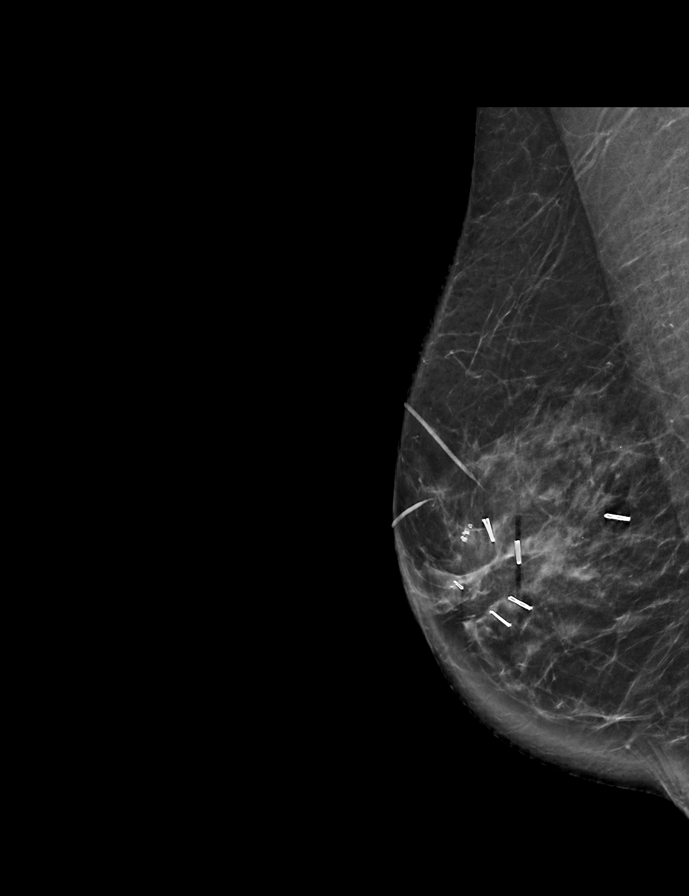

[L CC tomo · 2 of 66 frames shown]
[frame 22/66]
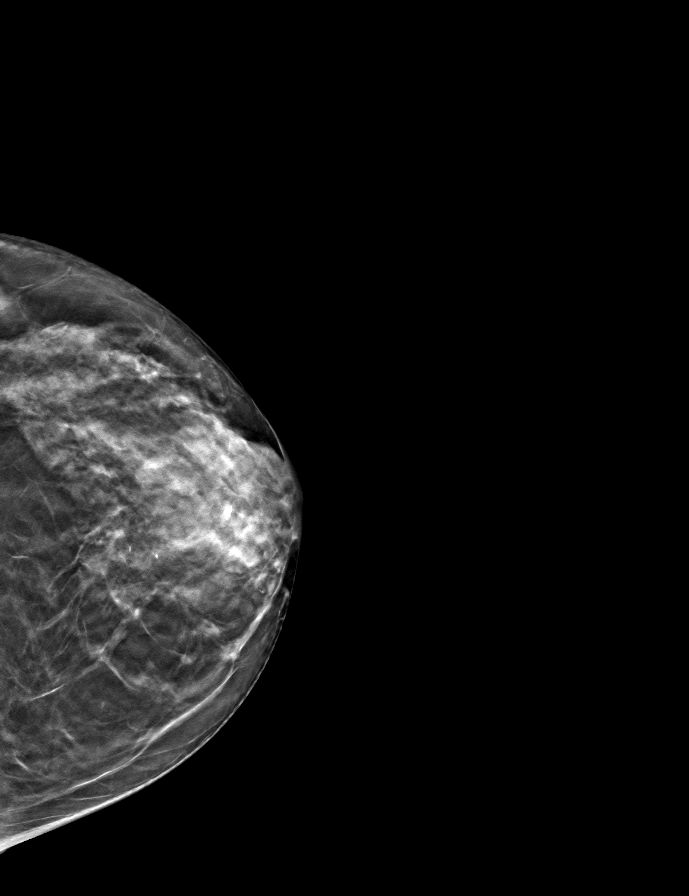
[frame 33/66]
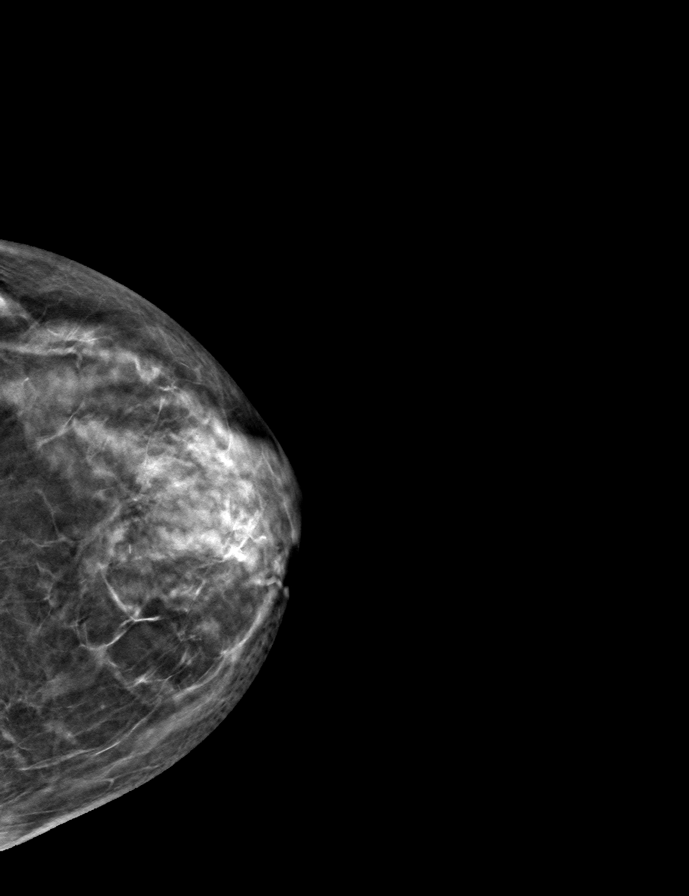

[L MLO tomo · tomo slice 31/62.0]
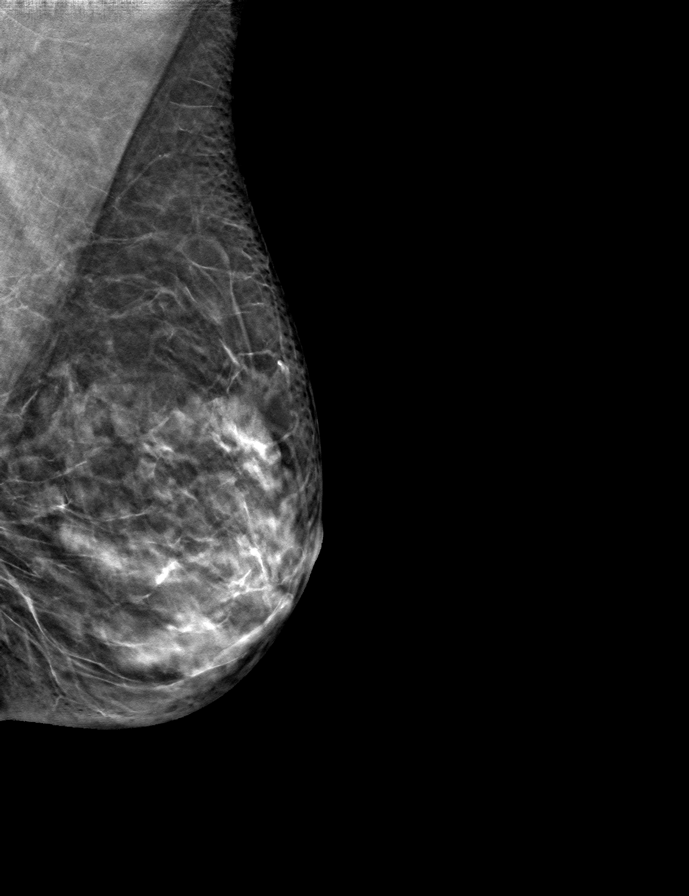

[R MLO tomo · tomo slice 35/70.0]
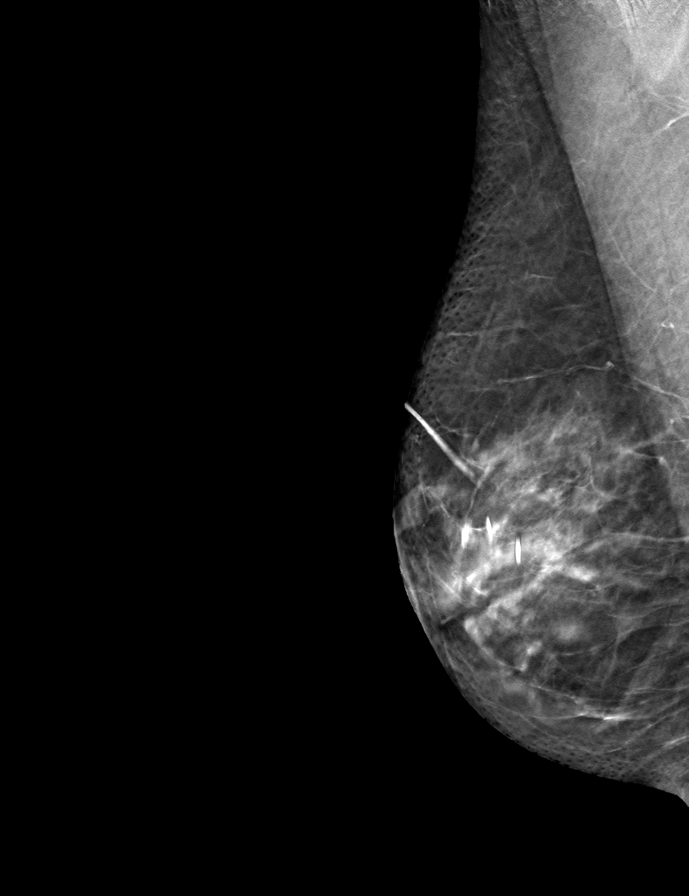

[R CC tomo · tomo slice 35/68.0]
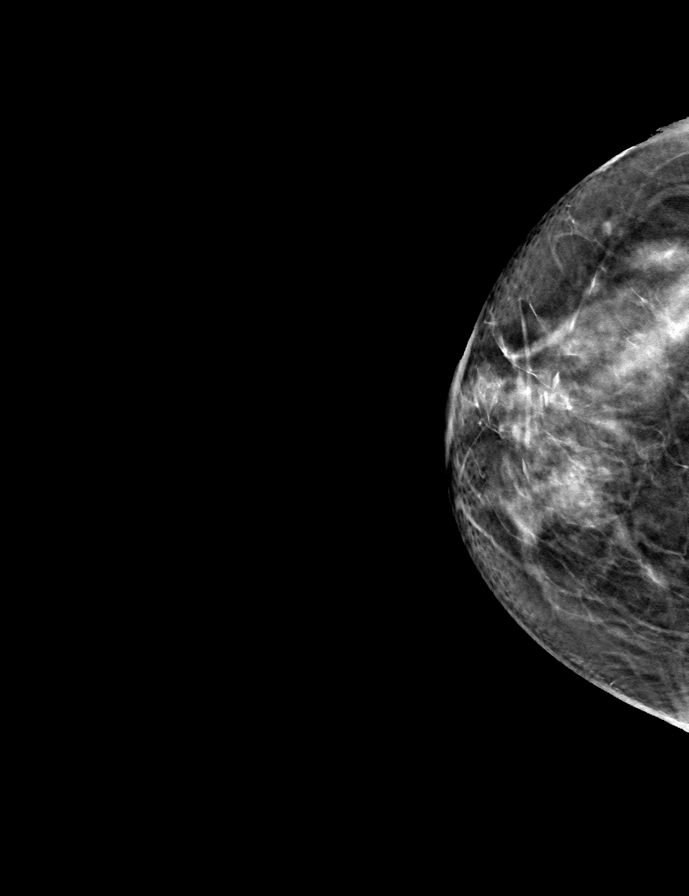

[9 of 24 positions shown; findings below may reference images not displayed]

ACR Breast Density Category c: The breast tissue is heterogeneously
dense, which may obscure small masses.
FINDINGS: There are no findings suspicious for malignancy.
IMPRESSION: No mammographic evidence of malignancy. A result letter of this
screening mammogram will be mailed directly to the patient.

RECOMMENDATION:
Screening mammogram in one year. (Code:Q3-W-BC3)

BI-RADS CATEGORY  1: Negative.

## 2022-06-17 ENCOUNTER — Other Ambulatory Visit: Payer: Self-pay | Admitting: Internal Medicine

## 2022-06-24 ENCOUNTER — Telehealth: Payer: BC Managed Care – PPO | Admitting: Internal Medicine

## 2022-07-21 ENCOUNTER — Telehealth: Payer: BC Managed Care – PPO | Admitting: Internal Medicine

## 2022-07-26 ENCOUNTER — Encounter: Payer: Self-pay | Admitting: Internal Medicine

## 2022-07-26 ENCOUNTER — Telehealth (INDEPENDENT_AMBULATORY_CARE_PROVIDER_SITE_OTHER): Payer: BC Managed Care – PPO | Admitting: Internal Medicine

## 2022-07-26 VITALS — BP 130/82 | HR 75 | Ht 64.0 in

## 2022-07-26 DIAGNOSIS — I471 Supraventricular tachycardia: Secondary | ICD-10-CM | POA: Diagnosis not present

## 2022-07-26 NOTE — Progress Notes (Signed)
Electrophysiology TeleHealth Note   Due to national recommendations of social distancing due to COVID 19, an audio/video telehealth visit is felt to be most appropriate for this patient at this time.  See MyChart message from today for the patient's consent to telehealth for Carolyn Chaney.   Date:  07/26/2022   ID:  [name], DOB 05/27/1961, MRN [id number]  Location: patient's home  Provider location: [address], [place]  Evaluation Performed: Follow-up visit  PCP:  [name], MD  Cardiologist:     Electrophysiologist:  SK   Chief Complaint:     History of Present Illness:    Carolyn Chaney is a 61 y.o. female who presents via audio/video conferencing for a telehealth visit today.  Since last being seen in our clinic for palpitations  the patient reports she is much improved with interval transfusion and thw quiescence of her crohns   feels better than she has in a long time   Event recorder demonstrated a single triggered event with sinus rhythm.  There was a nonsustained atrial tachycardia event.      Past Medical History:  Diagnosis Date   Anxiety    Breast mass, right    Chronic GI bleeding 2/94/7654   Complication of anesthesia    Depression    Dysrhythmia    PVC,s, atrial tachycardia   Gastric ulcer    Hiatal hernia    Iron deficiency anemia 11/07/2011   Irregular heart rate    PONV (postoperative nausea and vomiting)    SBO (small bowel obstruction) ([place])    in 2 different places per patient     Past Surgical History:  Procedure Laterality Date   APPENDECTOMY     BOWEL RESECTION  05/26/2021   BREAST EXCISIONAL BIOPSY Right 1980   Benign   BREAST EXCISIONAL BIOPSY Right 2020   BREAST LUMPECTOMY WITH RADIOACTIVE SEED LOCALIZATION Right 10/09/2019   Procedure: RIGHT BREAST LUMPECTOMY WITH RADIOACTIVE SEED LOCALIZATION;  Surgeon: Fanny Skates, MD;  Location: [facility];  Service: General;  Laterality: Right;    BREAST SURGERY     x2   COLONOSCOPY WITH PROPOFOL N/A 02/16/2016   Procedure: COLONOSCOPY WITH PROPOFOL;  Surgeon: Garlan Fair, MD;  Location: [facility];  Service: Endoscopy;  Laterality: N/A;   ESOPHAGOGASTRODUODENOSCOPY (EGD) WITH PROPOFOL N/A 02/16/2016   Procedure: ESOPHAGOGASTRODUODENOSCOPY (EGD) WITH PROPOFOL;  Surgeon: Garlan Fair, MD;  Location: [facility];  Service: Endoscopy;  Laterality: N/A;   HERNIA REPAIR     bilateral inguinal hernia as child   TONSILLECTOMY      Current Outpatient Medications  Medication Sig Dispense Refill   acetaminophen (TYLENOL) 500 MG tablet Take 500 mg by mouth every 6 (six) hours as needed for mild pain.     ALPRAZolam (XANAX) 0.5 MG tablet Take 0.25 mg by mouth daily as needed for anxiety.      budesonide (ENTOCORT EC) 3 MG 24 hr capsule TAKE 2 CAPSULES (6 MG TOTAL) BY MOUTH DAILY. 180 capsule 1   cholecalciferol (VITAMIN D) 1000 units tablet Take 1,000 Units by mouth daily as needed.      metoprolol succinate (TOPROL-XL) 25 MG 24 hr tablet TAKE 1 TABLET BY MOUTH EVERY DAY 90 tablet 3   sertraline (ZOLOFT) 50 MG tablet Take 1 tablet by mouth daily.     vitamin B-12 (CYANOCOBALAMIN) 1000 MCG tablet Take 1,000 mcg by mouth daily as needed.      cyclobenzaprine (FLEXERIL) 10  MG tablet Take 5-10 mg by mouth 3 (three) times daily. (Patient not taking: Reported on 07/26/2022)     dicyclomine (BENTYL) 10 MG capsule Take 1 capsule (10 mg total) by mouth every 6 (six) hours as needed for spasms. (Patient not taking: Reported on 07/26/2022) 30 capsule 3   eszopiclone (LUNESTA) 2 MG TABS tablet Take 2 mg by mouth at bedtime as needed. (Patient not taking: Reported on 07/26/2022)     No current facility-administered medications for this visit.    Allergies:   Nsaids, Other, Venofer [iron sucrose], Feraheme [ferumoxytol], Zofran [ondansetron], and Zithromax [azithromycin]   Social History:  The patient  reports that she has never smoked. She has  never used smokeless tobacco. She reports current alcohol use. She reports that she does not use drugs.   Family History:  The patient's   family history includes Diabetes in her sister; Heart attack in her father and sister; Heart disease in her father; Hiatal hernia in her mother; Lung cancer in her mother; Rectal cancer in her sister.   ROS:  Please see the history of present illness.   All other systems are personally reviewed and negative.    Exam:    Vital Signs:  BP 130/82   Pulse 75   Ht 5' 4"  (1.626 m)   BMI 20.60 kg/m        Labs/Other Tests and Data Reviewed:    Recent Labs: 06/02/2022: ALT 13; BUN 15; Creatinine 0.88; Hemoglobin 12.5; Magnesium 2.1; Platelet Count 205; Potassium 3.9; Sodium 142   Wt Readings from Last 3 Encounters:  06/02/22 120 lb (54.4 kg)  05/18/22 120 lb 3.2 oz (54.5 kg)  04/05/22 121 lb (54.9 kg)     Other studies personally reviewed:      ASSESSMENT & PLAN:    Atrial tachycardia   Crohns disease   Fe Def anemia    Orthostatic and heat intolerance   Anxiety    Much improved with Rx of her IRON deficiency anemia Follow  COVID 19 screen The patient denies symptoms of COVID 19 at this time.  The importance of social distancing was discussed today.  Follow-up:  61m   Current medicines are reviewed at length with the patient today.   The patient  concerns regarding her medicines.  The following changes were made today:    Labs/ tests ordered today include:   No orders of the defined types were placed in this encounter.   Future tests ( post COVID )     Patient Risk:  after full review of this patients clinical status, I feel that they are at moderate  risk at this time.  Today, I have spent 11 minutes with the patient with telehealth technology discussing the above.  Signed, SVirl Axe MD  07/26/2022 1:43 PM     [id number] [phone] (office) [phone](fax)

## 2022-07-26 NOTE — Patient Instructions (Addendum)
Medication Instructions:  Your physician recommends that you continue on your current medications as directed. Please refer to the Current Medication list given to you today.  *If you need a refill on your cardiac medications before your next appointment, please call your pharmacy*   Lab Work: None ordered.  If you have labs (blood work) drawn today and your tests are completely normal, you will receive your results only by: [place] (if you have MyChart) OR A paper copy in the mail If you have any lab test that is abnormal or we need to change your treatment, we will call you to review the results.   Testing/Procedures: None ordered.    Follow-Up: At [facility], you and your health needs are our priority.  As part of our continuing mission to provide you with exceptional heart care, we have created designated Provider Care Teams.  These Care Teams include your primary Cardiologist (physician) and Advanced Practice Providers (APPs -  Physician Assistants and Nurse Practitioners) who all work together to provide you with the care you need, when you need it.  We recommend signing up for the patient portal called "MyChart".  Sign up information is provided on this After Visit Summary.  MyChart is used to connect with patients for Virtual Visits (Telemedicine).  Patients are able to view lab/test results, encounter notes, upcoming appointments, etc.  Non-urgent messages can be sent to your provider as well.   To learn more about what you can do with MyChart, go to [email or web address].    Your next appointment:   4 months with Dr Caryl Comes  11/30/2022 at 1045am  Important Information About Sugar

## 2022-07-28 ENCOUNTER — Inpatient Hospital Stay: Payer: BC Managed Care – PPO | Admitting: Hematology & Oncology

## 2022-07-28 ENCOUNTER — Inpatient Hospital Stay: Payer: BC Managed Care – PPO | Attending: Hematology & Oncology

## 2022-08-08 ENCOUNTER — Telehealth: Payer: Self-pay | Admitting: Gastroenterology

## 2022-08-08 NOTE — Telephone Encounter (Signed)
Patient called and states she isn't having any issues with her chron's at this time. She has an appt sch'd for 8/31 and she didn't know if it were necessary to keep this appt since shes having no issues. Copay is $70 and would like to avoid paying that if possible. Patient would like a call back to advise, thank you!

## 2022-08-08 NOTE — Telephone Encounter (Signed)
Pt rescheduled for 09/07/22 at 10:40 am with Dr. Bryan Lemma.

## 2022-08-11 ENCOUNTER — Ambulatory Visit: Payer: BC Managed Care – PPO | Admitting: Gastroenterology

## 2022-08-17 ENCOUNTER — Telehealth (HOSPITAL_BASED_OUTPATIENT_CLINIC_OR_DEPARTMENT_OTHER): Payer: Self-pay

## 2022-08-18 ENCOUNTER — Other Ambulatory Visit (HOSPITAL_BASED_OUTPATIENT_CLINIC_OR_DEPARTMENT_OTHER): Payer: 59

## 2022-08-29 NOTE — Telephone Encounter (Signed)
Patient is requesting a call back, is not really not feeling well

## 2022-08-29 NOTE — Telephone Encounter (Signed)
Inbound call from patient stating she has been sick all weekend. Patient states she has been experiencing spasms she has also been hurting from the belly button down. Patient states she has also experience pain when she sits for long periods of time. Patient has ov 9/27, Advised patient that is the soonest . Please advise

## 2022-08-30 NOTE — Telephone Encounter (Signed)
Difficult to know if her recent symptoms are due to obstruction (partial), active Crohn's inflammation, or possibly overlapping infectious enteritis.  Based on her clinical history, I believe it is reasonable to proceed with CT to evaluate further.  Plan for the following: - CT abdomen/pelvis with IV/oral contrast - Check CBC, ESR, CRP, fecal calprotectin - Check GI PCR panel -Keep follow-up appointment with me next week as scheduled

## 2022-08-30 NOTE — Telephone Encounter (Signed)
Left message for pt to call back  °

## 2022-08-30 NOTE — Telephone Encounter (Signed)
Pt has a history of crohn's and is taking budesonide. Pt reports symptoms began several days ago. Pt states she was not able to have a bowel movement and then started to feel really sick and started throwing up. Pt reports she had severe lower abd pain on Saturday, Sunday, and Monday, but that symptoms have improved today because she was finally able to have diarrhea 2 to 3 times last night. Pt states she was not able to eat food without having pain so she started drinking liquids over the weekend and had stuck with mainly liquids. Pt believes she may have had a partial blockage because she was able to pass gas but all her symptoms seemed to be the same as the last time she had a blockage except that her abd pain was a little lower. Advised pt go to urgent care or ED since she is having pain. Pt stated that pain has improved and states that if she goes they will say she is fine and send her home. Pt requesting a CT scan be ordered and wanted to know if Dr. Bryan Lemma had any more recommendations.

## 2022-08-31 ENCOUNTER — Other Ambulatory Visit: Payer: Self-pay

## 2022-08-31 DIAGNOSIS — R112 Nausea with vomiting, unspecified: Secondary | ICD-10-CM

## 2022-08-31 DIAGNOSIS — K50919 Crohn's disease, unspecified, with unspecified complications: Secondary | ICD-10-CM

## 2022-08-31 DIAGNOSIS — R197 Diarrhea, unspecified: Secondary | ICD-10-CM

## 2022-08-31 DIAGNOSIS — R1084 Generalized abdominal pain: Secondary | ICD-10-CM

## 2022-08-31 NOTE — Telephone Encounter (Signed)
Lab orders entered and CT scheduled for 09/06/22 at 5 pm. Pt aware she needs to arrive at [facility] at 4:30 pm and be NPO 4 hours prior. Pt is going to pick up contrast when she comes in for labs. Pt will come to 2nd floor to pick up contrast. Instructed pt to drink 1 bottle of contrast at 3 pm and the 2nd bottle at 4 pm on day of CT scan. Pt verbalized understanding and had no other concerns at end of call

## 2022-09-01 ENCOUNTER — Other Ambulatory Visit (INDEPENDENT_AMBULATORY_CARE_PROVIDER_SITE_OTHER): Payer: BC Managed Care – PPO

## 2022-09-01 ENCOUNTER — Other Ambulatory Visit: Payer: Self-pay

## 2022-09-01 DIAGNOSIS — K50919 Crohn's disease, unspecified, with unspecified complications: Secondary | ICD-10-CM | POA: Diagnosis not present

## 2022-09-01 DIAGNOSIS — R197 Diarrhea, unspecified: Secondary | ICD-10-CM

## 2022-09-01 DIAGNOSIS — R112 Nausea with vomiting, unspecified: Secondary | ICD-10-CM

## 2022-09-01 DIAGNOSIS — R1084 Generalized abdominal pain: Secondary | ICD-10-CM

## 2022-09-01 LAB — C-REACTIVE PROTEIN: CRP: 2 mg/dL (ref 0.5–20.0)

## 2022-09-01 LAB — CBC WITH DIFFERENTIAL/PLATELET
Basophils Absolute: 0 10*3/uL (ref 0.0–0.1)
Basophils Relative: 0.6 % (ref 0.0–3.0)
Eosinophils Absolute: 0.1 10*3/uL (ref 0.0–0.7)
Eosinophils Relative: 1.2 % (ref 0.0–5.0)
HCT: 39.2 % (ref 36.0–46.0)
Hemoglobin: 13.2 g/dL (ref 12.0–15.0)
Lymphocytes Relative: 20.5 % (ref 12.0–46.0)
Lymphs Abs: 1.1 10*3/uL (ref 0.7–4.0)
MCHC: 33.6 g/dL (ref 30.0–36.0)
MCV: 95.4 fl (ref 78.0–100.0)
Monocytes Absolute: 0.3 10*3/uL (ref 0.1–1.0)
Monocytes Relative: 5.9 % (ref 3.0–12.0)
Neutro Abs: 4 10*3/uL (ref 1.4–7.7)
Neutrophils Relative %: 71.8 % (ref 43.0–77.0)
Platelets: 238 10*3/uL (ref 150.0–400.0)
RBC: 4.11 Mil/uL (ref 3.87–5.11)
RDW: 12.8 % (ref 11.5–15.5)
WBC: 5.6 10*3/uL (ref 4.0–10.5)

## 2022-09-01 LAB — CREATININE, SERUM: Creatinine, Ser: 0.8 mg/dL (ref 0.40–1.20)

## 2022-09-01 LAB — BUN: BUN: 9 mg/dL (ref 6–23)

## 2022-09-01 LAB — SEDIMENTATION RATE: Sed Rate: 19 mm/hr (ref 0–30)

## 2022-09-05 ENCOUNTER — Other Ambulatory Visit: Payer: BC Managed Care – PPO

## 2022-09-05 ENCOUNTER — Telehealth: Payer: Self-pay | Admitting: Gastroenterology

## 2022-09-05 DIAGNOSIS — A09 Infectious gastroenteritis and colitis, unspecified: Secondary | ICD-10-CM | POA: Diagnosis not present

## 2022-09-05 DIAGNOSIS — R1084 Generalized abdominal pain: Secondary | ICD-10-CM

## 2022-09-05 DIAGNOSIS — R112 Nausea with vomiting, unspecified: Secondary | ICD-10-CM | POA: Diagnosis not present

## 2022-09-05 DIAGNOSIS — K50919 Crohn's disease, unspecified, with unspecified complications: Secondary | ICD-10-CM

## 2022-09-05 DIAGNOSIS — R197 Diarrhea, unspecified: Secondary | ICD-10-CM

## 2022-09-05 NOTE — Telephone Encounter (Signed)
Inbound call from patient stating that Dr. Bryan Lemma wants her to have a CT scan at [facility] Patient states that she has issues with [facility] CT because it is very small and there is not bathroom in the room with the CT scan is and also it is small. Patient is requesting a call to discuss if there is anyway she can get her order for the CT sent to [facility] and sent as STAT. Please advise.

## 2022-09-05 NOTE — Telephone Encounter (Signed)
Rescheduled CT appt to [facility] on 9/26 at 11:30 am. Called pt back to let her know. Pt stated that this time would not work. Gave pt number to radiology scheduling so she could reschedule.

## 2022-09-06 ENCOUNTER — Ambulatory Visit (HOSPITAL_COMMUNITY): Admission: RE | Admit: 2022-09-06 | Payer: BC Managed Care – PPO | Source: Ambulatory Visit

## 2022-09-06 ENCOUNTER — Ambulatory Visit (HOSPITAL_BASED_OUTPATIENT_CLINIC_OR_DEPARTMENT_OTHER): Admission: RE | Admit: 2022-09-06 | Payer: BC Managed Care – PPO | Source: Ambulatory Visit

## 2022-09-07 ENCOUNTER — Ambulatory Visit: Payer: BC Managed Care – PPO | Admitting: Gastroenterology

## 2022-09-07 ENCOUNTER — Ambulatory Visit (HOSPITAL_BASED_OUTPATIENT_CLINIC_OR_DEPARTMENT_OTHER)
Admission: RE | Admit: 2022-09-07 | Discharge: 2022-09-07 | Disposition: A | Discharge status: Home / Self Care | Payer: BC Managed Care – PPO | Source: Ambulatory Visit | Attending: Gastroenterology | Admitting: Gastroenterology

## 2022-09-07 ENCOUNTER — Encounter (HOSPITAL_BASED_OUTPATIENT_CLINIC_OR_DEPARTMENT_OTHER): Payer: Self-pay

## 2022-09-07 ENCOUNTER — Encounter: Payer: Self-pay | Admitting: Gastroenterology

## 2022-09-07 VITALS — BP 122/80 | HR 75 | Ht 65.0 in | Wt 120.6 lb

## 2022-09-07 DIAGNOSIS — R1084 Generalized abdominal pain: Secondary | ICD-10-CM | POA: Insufficient documentation

## 2022-09-07 DIAGNOSIS — R197 Diarrhea, unspecified: Secondary | ICD-10-CM | POA: Diagnosis not present

## 2022-09-07 DIAGNOSIS — N281 Cyst of kidney, acquired: Secondary | ICD-10-CM | POA: Diagnosis not present

## 2022-09-07 DIAGNOSIS — K7689 Other specified diseases of liver: Secondary | ICD-10-CM | POA: Diagnosis not present

## 2022-09-07 DIAGNOSIS — K2100 Gastro-esophageal reflux disease with esophagitis, without bleeding: Secondary | ICD-10-CM

## 2022-09-07 DIAGNOSIS — K922 Gastrointestinal hemorrhage, unspecified: Secondary | ICD-10-CM | POA: Diagnosis not present

## 2022-09-07 DIAGNOSIS — R112 Nausea with vomiting, unspecified: Secondary | ICD-10-CM

## 2022-09-07 DIAGNOSIS — R109 Unspecified abdominal pain: Secondary | ICD-10-CM | POA: Diagnosis not present

## 2022-09-07 DIAGNOSIS — K50919 Crohn's disease, unspecified, with unspecified complications: Secondary | ICD-10-CM

## 2022-09-07 MED ORDER — IOHEXOL 300 MG/ML  SOLN
100.0000 mL | Freq: Once | INTRAMUSCULAR | Status: AC | PRN
  Administered 2022-09-07: 75 mL via INTRAVENOUS

## 2022-09-07 NOTE — Progress Notes (Signed)
Chief Complaint:    Crohn's disease  GI History: 61 year old female initially seen in the GI clinic 09/16/2020 for evaluation of hematochezia and IDA.  History of IDA dating back to 09/2011 with intermittent IV iron infusions over the years.  EGD/colonoscopy completed in 02/2016 for evaluation of IDA as outlined below.     Endoscopic/GI Evaluation history: -Per patient reports history of NSAID induced ulcers with bowel obstruction 2/2 stricture in ~1998. Ex-lap with appendectomy performed. UGI series at that time n/f SB obstruction per patient, and treated with NSAID cessation and conservative management. She had extensive w/u for migraines, but eventuallly resorted to NSAIDs again as the only effective tx. -Colonoscopy (11/2002): Normal except for erosion in the cecum.  Random biopsies demonstrate active colitis without chronic changes -EGD (2007): Normal including normal small bowel biopsies -Colonoscopy (01/15/2010): Normal.  Rectal hyperplastic polyps.  Repeat 10 years -09/16/2011: Ferritin 6 -CT enterography (09/2011): Distal ileum with several short segments of bowel wall thickening and mucosal enhancement (ileitis).  No obstruction -EGD (02/16/2016, Dr. Howell Rucks): Normal.  Biopsies negative for celiac -Colonoscopy (02/16/2016, Dr. Howell Rucks): Shallow erosion at IC valve (biopsy: Nonspecific ulcer), Otherwise normal colon.  Unable to intubate IC valve and examined terminal ileum - 07/2020: Ferritin <4, iron 29, TIBC 474, sat 6%.  H/H 8.8/29.9 with MCV/RDW 86/30.7. -Colonoscopy (11/12/2020, Dr. Bryan Lemma): nonbleeding grade 1 internal hemorrhoids, moderate stenosis of the ileocecal valve which was eventually traversed, second stenosis approximately 2 cm proximal to the ICV which could not be traversed, visualized mucosa in the terminal ileum was erythematous and with ulcer x2; pathology showed no active or chronic inflammatory changes to support or suggest Crohn's disease - EGD (11/12/2020,  Dr. Bryan Lemma): LA grade A reflux esophagitis and gastritis; placed on pantoprazole 40 twice a day and Carafate; pathology showed mild gastritis with no evidence of H. pylori - Hospital admission 03/2021 for small bowel obstruction - CT (04/04/2021): Fluid-filled mildly dilated distal small bowel with mucosal thickening and stricturing at the terminal ileum suggesting partial obstruction at this location.  Possible duodenal ulcer - MR Enterography (04/07/2021): Persistent wall thickening and enhancement of the distal and terminal ileum.  Suspect stricture narrowing likely from chronic inflammation (chronic underlying scarring change with fibrosis) plus active inflammation causing combination of mild partial functional mechanical obstruction. - 04/2021: IV iron - 04/20/2021: GI follow-up: Was feeling better.  Reports only taking the budesonide 9 mg/day for couple days, then reduce to 6 mg/day for a couple days, and was at 3 mg/day at that time due to concerns about medication induced symptoms. - 05/05/2021: Case discussed at GI multidisciplinary conference.  Higher suspicion for stricturing Crohn's Disease rather than NSAID induced since she has very rare NSAID use since age 4 and none in the last 10-12 years.  Imaging with 10+ centimeter stricture in the distal ileum with some superimposed inflammatory changes.  Recommended trial of prednisone but patient declined. - Evaluated by Dr. Hester Mates at [facility]. - 05/26/2021: Underwent right hemicolectomy and 16 cm of ileal resection at [facility].  Pathology suspicious for Crohn's Disease. - 07/27/2021: Follow-up in GI clinic.  Had mild flare approximately 6 weeks post op.  Had restarted budesonide 4.5 mg/day with improvement. - 12/21/2021: ER evaluation for palpitations, nausea, lower abdominal pain, rectal bleeding.  H/H at baseline, normal WBC, CMP, INR.  FOBT positive.  CRP 2.3, normal ESR.  Calprotectin 320.  CT with diffuse wall thickening and mucosal  enhancement in the sigmoid without obstruction.  Recommended repeat  CT in 3 months.  Treated with prednisone.  Again recommended Entyvio or other biologic therapy. Declined. - 01/26/2022: Normal ESR, CRP, calprotectin - 02/11/2022: GI follow-up: Feeling better after weaning off prednisone.  Restarted budesonide     Separately, reflux well controlled since reducing Protonix to 20 mg/day.  HPI:     Patient is a 61 y.o. female presenting to the Gastroenterology Clinic for follow-up.  Last seen by me on 02/11/2022.  Was feeling well at that time with budesonide 6 mg/day.  Again declined escalation of therapy and patient wishes to continue with prolonged budesonide despite ADR discussion.  Prescribed Bentyl for IBS overlap.  Had follow-up in the Hematology clinic on 06/02/2022 for continued evaluation/treatment of IDA.  Had follow-up with Cardiology on 07/26/2022 for atrial tachycardia.  Patient called into the office earlier this month with c/o lack of BM, nausea/vomiting, lower abdominal pain. No diarrhea hematochezia, melena. +flatus. Recommended CT and labs. - Normal CBC, ESR, CRP - Patient completed stool studies 2 days ago, but not yet resulted. - CT completed earlier today; no results yet  Sxs have improved and started slowly advancing diet again. Was initially having post pranial abdominal pain. Can have nocturnal pain. Did not trial Bentyl (has never taken it).   Currenlty taking Budesonide 6 mg/day.   Review of systems:     No chest pain, no SOB, no fevers, no urinary sx   Past Medical History:  Diagnosis Date   Anxiety    Breast mass, right    Chronic GI bleeding 2/48/2500   Complication of anesthesia    Depression    Dysrhythmia    PVC,s, atrial tachycardia   Gastric ulcer    Hiatal hernia    Iron deficiency anemia 11/07/2011   Irregular heart rate    PONV (postoperative nausea and vomiting)    SBO (small bowel obstruction) ([place])    in 2 different places per patient      Patient's surgical history, family medical history, social history, medications and allergies were all reviewed in Epic    Current Outpatient Medications  Medication Sig Dispense Refill   acetaminophen (TYLENOL) 500 MG tablet Take 500 mg by mouth every 6 (six) hours as needed for mild pain.     ALPRAZolam (XANAX) 0.5 MG tablet Take 0.25 mg by mouth daily as needed for anxiety.      budesonide (ENTOCORT EC) 3 MG 24 hr capsule TAKE 2 CAPSULES (6 MG TOTAL) BY MOUTH DAILY. 180 capsule 1   cholecalciferol (VITAMIN D) 1000 units tablet Take 1,000 Units by mouth daily as needed.      metoprolol succinate (TOPROL-XL) 25 MG 24 hr tablet TAKE 1 TABLET BY MOUTH EVERY DAY 90 tablet 3   sertraline (ZOLOFT) 50 MG tablet Take 1 tablet by mouth daily.     vitamin B-12 (CYANOCOBALAMIN) 1000 MCG tablet Take 1,000 mcg by mouth daily as needed.      cyclobenzaprine (FLEXERIL) 10 MG tablet Take 5-10 mg by mouth 3 (three) times daily. (Patient not taking: Reported on 07/26/2022)     dicyclomine (BENTYL) 10 MG capsule Take 1 capsule (10 mg total) by mouth every 6 (six) hours as needed for spasms. (Patient not taking: Reported on 07/26/2022) 30 capsule 3   eszopiclone (LUNESTA) 2 MG TABS tablet Take 2 mg by mouth at bedtime as needed. (Patient not taking: Reported on 07/26/2022)     No current facility-administered medications for this visit.    Physical Exam:     Ht  _0  (1.651 m)   Wt 120 lb 9.6 oz (54.7 kg)   BMI 20.07 kg/m   GENERAL:  Pleasant female in NAD PSYCH: : Cooperative, normal affect EENT:  conjunctiva pink, mucous membranes moist, neck supple without masses CARDIAC:  RRR, no murmur heard, no peripheral edema PULM: Normal respiratory effort, lungs CTA bilaterally, no wheezing ABDOMEN:  Nondistended, soft, nontender. No obvious masses, no hepatomegaly,  normal bowel sounds SKIN:  turgor, no lesions seen Musculoskeletal:  Normal muscle tone, normal strength NEURO: Alert and oriented x 3, no  focal neurologic deficits   IMPRESSION and PLAN:    1) Crohn's Disease 2) History of right hemicolectomy and distal ileal resection 61 year old female with stricturing type ileal Crohn's Disease. Diagnosis established at the time of ileal resection for chronic, obstructing stricture.  No prior evidence of colonic disease, but most recent CT in January demonstrates left-sided wall thickening in the sigmoid colon, suspicious for colonic Crohn's Disease. -We again discussed the possibility of escalation of therapy, which she is opposed - Continue budesonide for now.  Again, will need to discuss titrating off at follow-up, but do not want to change now in light of recent symptomatology - ESR, CRP normal.  Calprotectin pending - Depending on CT, symptoms, etc., may consider repeat colonoscopy at the end of the year or in 2024 for surveillance  3) Abdominal pain 4) Change in bowel habits 5) Nausea/vomiting More acute symptoms from earlier this month are improving.  Acute onset and description suspicious for pSBO, particular given her clinical and surgical history.  Thankfully symptoms improving and tolerating p.o.  Labs with normal CBC and inflammatory markers, which should suggest against Crohn's flare. - Follow-up on CT results that was performed earlier today - Follow-up on stool studies to include calprotectin, GI PCR - Continue slowly advancing p.o. as tolerated - If work-up negative and symptoms recurred, plan for RUQ [place] and HIDA scan    6) IBS overlap Again discussed possibility of IBS overlap.  The symptoms tend to be diet related.  More recent acute symptomatology seems more suspicious for partial SBO, but can certainly have had IBS overlap, viral gastritis, etc. - Recommended trialing Bentyl for diagnostic and therapeutic intent (she has never tried it but has the Rx at home) - Trial IBgard  7) Hepatic lesion on CT - Incidentally noted 13 mm low-density structure in the right lobe of  the liver -Evaluate on CT performed earlier today.  If needed, could consider MRI liver protocol depending on repeat CT findings  8) Iron deficiency anemia - Continue follow-up in the Hematology clinic as scheduled  9) GERD with erosive esophagitis - Well-controlled - Continue antireflux lifestyle/dietary modifications - Restart Protonix to take for the next 2-4 weeks for recent breakthrough symptoms         RTC 3 months or sooner prn  I spent 35 minutes of time, including in depth chart review, independent review of results as outlined above, communicating results with the patient directly, face-to-face time with the patient, coordinating care, and ordering studies and medications as appropriate, and documentation.        [place] ,DO, FACG 09/07/2022, 11:10 AM

## 2022-09-07 NOTE — Patient Instructions (Addendum)
If you are age 61 or younger, your body mass index should be between 19-25. Your Body mass index is 20.07 kg/m. If this is out of the aformentioned range listed, please consider follow up with your Primary Care Provider.   __________________________________________________________  The [place] [facility] providers would like to encourage you to use [facility] to communicate with providers for non-urgent requests or questions.  Due to long hold times on the telephone, sending your provider a message by [facility] may be a faster and more efficient way to get a response.  Please allow 48 business hours for a response.  Please remember that this is for non-urgent requests.   Due to recent changes in healthcare laws, you may see the results of your imaging and laboratory studies on MyChart before your provider has had a chance to review them.  We understand that in some cases there may be results that are confusing or concerning to you. Not all laboratory results come back in the same time frame and the provider may be waiting for multiple results in order to interpret others.  Please give [place] 48 hours in order for your provider to thoroughly review all the results before contacting the office for clarification of your results.   Please follow up in 3 months. Give [place] a call at [phone] to schedule an appointment.   Start taking over the counter IB Guard.  Thank you for choosing me and [facility].  Vito Cirigliano, D.O.

## 2022-09-09 LAB — GI PROFILE, STOOL, PCR

## 2022-09-09 LAB — CALPROTECTIN, FECAL: Calprotectin, Fecal: 22 ug/g (ref 0–120)

## 2022-09-13 ENCOUNTER — Other Ambulatory Visit: Payer: Self-pay | Admitting: Internal Medicine

## 2022-09-13 NOTE — Telephone Encounter (Signed)
Rx refill sent to pharmacy. 

## 2022-11-22 ENCOUNTER — Telehealth: Payer: Self-pay | Admitting: *Deleted

## 2022-11-22 NOTE — Telephone Encounter (Signed)
Received a voice mail want to have a lab check - called and lvm for a callback to schedule.

## 2022-11-25 ENCOUNTER — Ambulatory Visit: Payer: BC Managed Care – PPO | Admitting: Gastroenterology

## 2022-11-25 DIAGNOSIS — H5203 Hypermetropia, bilateral: Secondary | ICD-10-CM | POA: Diagnosis not present

## 2022-11-25 DIAGNOSIS — H524 Presbyopia: Secondary | ICD-10-CM | POA: Diagnosis not present

## 2022-11-25 DIAGNOSIS — Z8719 Personal history of other diseases of the digestive system: Secondary | ICD-10-CM | POA: Diagnosis not present

## 2022-11-25 DIAGNOSIS — H04123 Dry eye syndrome of bilateral lacrimal glands: Secondary | ICD-10-CM | POA: Diagnosis not present

## 2022-11-25 DIAGNOSIS — H2513 Age-related nuclear cataract, bilateral: Secondary | ICD-10-CM | POA: Diagnosis not present

## 2022-11-29 DIAGNOSIS — I951 Orthostatic hypotension: Secondary | ICD-10-CM | POA: Insufficient documentation

## 2022-11-30 ENCOUNTER — Ambulatory Visit: Payer: BC Managed Care – PPO | Admitting: Internal Medicine

## 2022-12-06 ENCOUNTER — Other Ambulatory Visit: Payer: Self-pay | Admitting: Gastroenterology

## 2022-12-09 DIAGNOSIS — D692 Other nonthrombocytopenic purpura: Secondary | ICD-10-CM | POA: Diagnosis not present

## 2022-12-09 DIAGNOSIS — L720 Epidermal cyst: Secondary | ICD-10-CM | POA: Diagnosis not present

## 2022-12-09 DIAGNOSIS — L814 Other melanin hyperpigmentation: Secondary | ICD-10-CM | POA: Diagnosis not present

## 2022-12-09 DIAGNOSIS — L570 Actinic keratosis: Secondary | ICD-10-CM | POA: Diagnosis not present

## 2022-12-09 DIAGNOSIS — L905 Scar conditions and fibrosis of skin: Secondary | ICD-10-CM | POA: Diagnosis not present

## 2022-12-29 ENCOUNTER — Ambulatory Visit: Payer: BC Managed Care – PPO | Admitting: Gastroenterology

## 2023-01-05 ENCOUNTER — Other Ambulatory Visit: Payer: Self-pay | Admitting: Gynecology

## 2023-01-05 DIAGNOSIS — Z1231 Encounter for screening mammogram for malignant neoplasm of breast: Secondary | ICD-10-CM

## 2023-01-23 ENCOUNTER — Encounter: Payer: Self-pay | Admitting: Internal Medicine

## 2023-01-23 ENCOUNTER — Ambulatory Visit: Payer: BC Managed Care – PPO | Attending: Internal Medicine | Admitting: Internal Medicine

## 2023-01-23 VITALS — BP 154/82 | HR 69 | Ht 65.0 in | Wt 126.8 lb

## 2023-01-23 DIAGNOSIS — I951 Orthostatic hypotension: Secondary | ICD-10-CM

## 2023-01-23 DIAGNOSIS — I471 Supraventricular tachycardia, unspecified: Secondary | ICD-10-CM

## 2023-01-23 NOTE — Patient Instructions (Signed)
Medication Instructions:  Your physician recommends that you continue on your current medications as directed. Please refer to the Current Medication list given to you today.  *If you need a refill on your cardiac medications before your next appointment, please call your pharmacy*   Lab Work: None ordered.  If you have labs (blood work) drawn today and your tests are completely normal, you will receive your results only by: [place] (if you have MyChart) OR A paper copy in the mail If you have any lab test that is abnormal or we need to change your treatment, we will call you to review the results.   Testing/Procedures: None ordered.    Follow-Up: At [facility], you and your health needs are our priority.  As part of our continuing mission to provide you with exceptional heart care, we have created designated Provider Care Teams.  These Care Teams include your primary Cardiologist (physician) and Advanced Practice Providers (APPs -  Physician Assistants and Nurse Practitioners) who all work together to provide you with the care you need, when you need it.  We recommend signing up for the patient portal called "MyChart".  Sign up information is provided on this After Visit Summary.  MyChart is used to connect with patients for Virtual Visits (Telemedicine).  Patients are able to view lab/test results, encounter notes, upcoming appointments, etc.  Non-urgent messages can be sent to your provider as well.   To learn more about what you can do with MyChart, go to [email or web address].    Your next appointment:   12 months with Dr Caryl Comes

## 2023-01-23 NOTE — Progress Notes (Signed)
Patient Care Team: [name], MD as PCP - General (Internal Medicine) Deboraha Sprang, MD as PCP - Cardiology (Cardiology) Deboraha Sprang, MD as PCP - Electrophysiology (Cardiology)   HPI  Carolyn Chaney is a 62 y.o. female seen in follow-up for palpitations associated with atrial tachycardia. She saw Dr. Lovena Le for consideration of ablation and elected to pursue therapy with flecainide--that was forsaken and she now takes low dose metoprolol with great benefit  She had concern about coronary disease --  CaScore 0   Records and Results Reviewed hospital records from recent endoscopy undertaken for Fe deficiency anemia in setting of Crohns disease identified 4/22 hospitalized at that time for an obstruction.  Ultimately underwent surgery at [facility] and is currently taking Budesinide  When seen by telehealth 8/23, she was much improved with the treatment of her iron deficiency anemia in the quiescence of her Crohn's disease.    Hyperlipidemia but familial intolerance of statins  The patient denies chest pain, shortness of breath, nocturnal dyspnea, orthopnea or peripheral edema.  There have been no  lightheadedness or syncope.  Recurrent problem with palpitations.  This occurred in the context of the death of her sister.   Date Cr K Hgb  11/22 0.81 4.6 11.1<<9.6  4/23 0.8 3.3 9.7  9/23 0.8  13.2      DATE TEST EF   3/22 CaScore (CE)    % Score=0                    Past Medical History:  Diagnosis Date   Anxiety    Breast mass, right    Chronic GI bleeding [id number]   Complication of anesthesia    Depression    Dysrhythmia    PVC,s, atrial tachycardia   Gastric ulcer    Hiatal hernia    Iron deficiency anemia 11/07/2011   Irregular heart rate    PONV (postoperative nausea and vomiting)    SBO (small bowel obstruction) ([place])    in 2 different places per patient     Past Surgical History:  Procedure Laterality Date   APPENDECTOMY     BOWEL  RESECTION  05/26/2021   BREAST EXCISIONAL BIOPSY Right 1980   Benign   BREAST EXCISIONAL BIOPSY Right 2020   BREAST LUMPECTOMY WITH RADIOACTIVE SEED LOCALIZATION Right 10/09/2019   Procedure: RIGHT BREAST LUMPECTOMY WITH RADIOACTIVE SEED LOCALIZATION;  Surgeon: Fanny Skates, MD;  Location: [facility];  Service: General;  Laterality: Right;   BREAST SURGERY     x2   COLONOSCOPY WITH PROPOFOL N/A 02/16/2016   Procedure: COLONOSCOPY WITH PROPOFOL;  Surgeon: Garlan Fair, MD;  Location: [facility];  Service: Endoscopy;  Laterality: N/A;   ESOPHAGOGASTRODUODENOSCOPY (EGD) WITH PROPOFOL N/A 02/16/2016   Procedure: ESOPHAGOGASTRODUODENOSCOPY (EGD) WITH PROPOFOL;  Surgeon: Garlan Fair, MD;  Location: [facility];  Service: Endoscopy;  Laterality: N/A;   HERNIA REPAIR     bilateral inguinal hernia as child   TONSILLECTOMY      Current Outpatient Medications  Medication Sig Dispense Refill   acetaminophen (TYLENOL) 500 MG tablet Take 500 mg by mouth every 6 (six) hours as needed for mild pain.     ALPRAZolam (XANAX) 0.5 MG tablet Take 0.25 mg by mouth daily as needed for anxiety.      budesonide (ENTOCORT EC) 3 MG 24 hr capsule TAKE 2 CAPSULES BY MOUTH ONCE DAILY AS DIRECTED 180 capsule 0   cholecalciferol (VITAMIN D)  1000 units tablet Take 1,000 Units by mouth daily as needed.      cyclobenzaprine (FLEXERIL) 10 MG tablet Take 5-10 mg by mouth 3 (three) times daily.     metoprolol succinate (TOPROL-XL) 25 MG 24 hr tablet TAKE 1 TABLET BY MOUTH EVERY DAY 90 tablet 1   sertraline (ZOLOFT) 50 MG tablet Take 1 tablet by mouth daily.     vitamin B-12 (CYANOCOBALAMIN) 1000 MCG tablet Take 1,000 mcg by mouth daily as needed.      No current facility-administered medications for this visit.    Allergies  Allergen Reactions   Nsaids Other (See Comments)    Bleeding    Other     Laxatives and fleet emema as well and antihistamines   Venofer [Iron Sucrose] Other (See  Comments)    Syncope, Numbness and tingling in arms and hands.   Feraheme [Ferumoxytol] Other (See Comments)    Syncope, Numbness and tingling in arms and hands.   Zofran [Ondansetron]     malice   Zithromax [Azithromycin]        Physical Exam BP (!) 154/82   Pulse 69   Ht 5' 5"$  (1.651 m)   Wt 126 lb 12.8 oz (57.5 kg)   SpO2 99%   BMI 21.10 kg/m  Well developed and nourished in no acute distress HENT normal Neck supple with JVP-  flat Clear Regular rate and rhythm, no murmurs or gallops Abd-soft with active BS No Clubbing cyanosis edema Skin-warm and dry A & Oriented  Grossly normal sensory and motor function  ECG sinus at 69 0 17/10/39     Assessment and  Plan  Atrial tachycardia  Crohns disease  Fe Def anemia   Orthostatic and heat intolerance  Anxiety  Hypertension   Palpitations have been worse.  Unfortunately, her sister just died last week.  Long illnesses.  Anemia is better.  She has magnesium at home but has not been taking it.  Suggested she do so we have talked about magnesium taurate this morning  Again encouraged her to work with her family and prepped restriction placed for "soul care "  Blood pressure is elevated.  In the context of her stress is hard to know what the right thing to do is so we will hold off, and have her follow-up with her PCP in about 3 months.

## 2023-01-25 ENCOUNTER — Other Ambulatory Visit: Payer: Self-pay | Admitting: Internal Medicine

## 2023-02-03 ENCOUNTER — Ambulatory Visit: Payer: BC Managed Care – PPO | Admitting: Gastroenterology

## 2023-03-01 ENCOUNTER — Ambulatory Visit: Payer: BC Managed Care – PPO

## 2023-03-15 DIAGNOSIS — J45909 Unspecified asthma, uncomplicated: Secondary | ICD-10-CM | POA: Diagnosis not present

## 2023-03-28 ENCOUNTER — Ambulatory Visit: Payer: BC Managed Care – PPO | Admitting: Gastroenterology

## 2023-03-29 ENCOUNTER — Ambulatory Visit: Payer: BC Managed Care – PPO

## 2023-04-05 ENCOUNTER — Inpatient Hospital Stay: Payer: BC Managed Care – PPO | Attending: Hematology & Oncology

## 2023-04-05 DIAGNOSIS — K5090 Crohn's disease, unspecified, without complications: Secondary | ICD-10-CM | POA: Insufficient documentation

## 2023-04-05 DIAGNOSIS — D500 Iron deficiency anemia secondary to blood loss (chronic): Secondary | ICD-10-CM | POA: Diagnosis not present

## 2023-04-05 LAB — FERRITIN: Ferritin: 80 ng/mL (ref 11–307)

## 2023-04-05 LAB — CMP (CANCER CENTER ONLY)
ALT: 18 U/L (ref 0–44)
AST: 17 U/L (ref 15–41)
Albumin: 4.4 g/dL (ref 3.5–5.0)
Alkaline Phosphatase: 88 U/L (ref 38–126)
Anion gap: 9 (ref 5–15)
BUN: 13 mg/dL (ref 8–23)
CO2: 31 mmol/L (ref 22–32)
Calcium: 9.7 mg/dL (ref 8.9–10.3)
Chloride: 104 mmol/L (ref 98–111)
Creatinine: 0.82 mg/dL (ref 0.44–1.00)
GFR, Estimated: >60 mL/min (ref >60)
Glucose, Bld: 80 mg/dL (ref 70–99)
Potassium: 4.2 mmol/L (ref 3.5–5.1)
Sodium: 144 mmol/L (ref 135–145)
Total Bilirubin: 0.4 mg/dL (ref 0.3–1.2)
Total Protein: 7 g/dL (ref 6.5–8.1)

## 2023-04-05 LAB — CBC WITH DIFFERENTIAL (CANCER CENTER ONLY)
Abs Immature Granulocytes: 0.02 10*3/uL (ref 0.00–0.07)
Basophils Absolute: 0 10*3/uL (ref 0.0–0.1)
Basophils Relative: 1 %
Eosinophils Absolute: 0.1 10*3/uL (ref 0.0–0.5)
Eosinophils Relative: 1 %
HCT: 41.5 % (ref 36.0–46.0)
Hemoglobin: 13.4 g/dL (ref 12.0–15.0)
Immature Granulocytes: 0 %
Lymphocytes Relative: 28 %
Lymphs Abs: 1.6 10*3/uL (ref 0.7–4.0)
MCH: 31.1 pg (ref 26.0–34.0)
MCHC: 32.3 g/dL (ref 30.0–36.0)
MCV: 96.3 fL (ref 80.0–100.0)
Monocytes Absolute: 0.4 10*3/uL (ref 0.1–1.0)
Monocytes Relative: 7 %
Neutro Abs: 3.6 10*3/uL (ref 1.7–7.7)
Neutrophils Relative %: 63 %
Platelet Count: 250 10*3/uL (ref 150–400)
RBC: 4.31 MIL/uL (ref 3.87–5.11)
RDW: 12.5 % (ref 11.5–15.5)
WBC Count: 5.7 10*3/uL (ref 4.0–10.5)
nRBC: 0 % (ref 0.0–0.2)

## 2023-04-05 LAB — IRON AND IRON BINDING CAPACITY (CC-WL,HP ONLY)
Iron: 151 ug/dL (ref 28–170)
Saturation Ratios: 43 % — ABNORMAL HIGH (ref 10.4–31.8)
TIBC: 353 ug/dL (ref 250–450)
UIBC: 202 ug/dL (ref 148–442)

## 2023-04-05 LAB — RETICULOCYTES
Immature Retic Fract: 10.3 % (ref 2.3–15.9)
RBC.: 4.24 MIL/uL (ref 3.87–5.11)
Retic Count, Absolute: 53 10*3/uL (ref 19.0–186.0)
Retic Ct Pct: 1.3 % (ref 0.4–3.1)

## 2023-04-06 ENCOUNTER — Encounter: Payer: Self-pay | Admitting: *Deleted

## 2023-04-06 ENCOUNTER — Other Ambulatory Visit (INDEPENDENT_AMBULATORY_CARE_PROVIDER_SITE_OTHER): Payer: BC Managed Care – PPO

## 2023-04-06 ENCOUNTER — Ambulatory Visit: Payer: BC Managed Care – PPO | Admitting: Gastroenterology

## 2023-04-06 ENCOUNTER — Encounter: Payer: Self-pay | Admitting: Gastroenterology

## 2023-04-06 ENCOUNTER — Ambulatory Visit: Payer: BC Managed Care – PPO

## 2023-04-06 ENCOUNTER — Ambulatory Visit
Admission: RE | Admit: 2023-04-06 | Discharge: 2023-04-06 | Disposition: A | Discharge status: Home / Self Care | Payer: BC Managed Care – PPO | Source: Ambulatory Visit | Attending: Gynecology | Admitting: Gynecology

## 2023-04-06 ENCOUNTER — Encounter: Payer: Self-pay | Admitting: Hematology & Oncology

## 2023-04-06 VITALS — BP 138/78 | HR 68 | Ht 65.0 in | Wt 122.0 lb

## 2023-04-06 DIAGNOSIS — K582 Mixed irritable bowel syndrome: Secondary | ICD-10-CM

## 2023-04-06 DIAGNOSIS — K50919 Crohn's disease, unspecified, with unspecified complications: Secondary | ICD-10-CM | POA: Diagnosis not present

## 2023-04-06 DIAGNOSIS — Z1231 Encounter for screening mammogram for malignant neoplasm of breast: Secondary | ICD-10-CM

## 2023-04-06 DIAGNOSIS — R109 Unspecified abdominal pain: Secondary | ICD-10-CM | POA: Diagnosis not present

## 2023-04-06 LAB — VITAMIN D 25 HYDROXY (VIT D DEFICIENCY, FRACTURES): VITD: 38.73 ng/mL (ref 30.00–100.00)

## 2023-04-06 LAB — SEDIMENTATION RATE: Sed Rate: 18 mm/hr (ref 0–30)

## 2023-04-06 LAB — C-REACTIVE PROTEIN: CRP: <1 mg/dL (ref 0.5–20.0)

## 2023-04-06 LAB — FOLATE: Folate: 9.2 ng/mL (ref >5.9)

## 2023-04-06 LAB — VITAMIN B12: Vitamin B-12: 975 pg/mL — ABNORMAL HIGH (ref 211–911)

## 2023-04-06 MED ORDER — CLENPIQ 10-3.5-12 MG-GM -GM/175ML PO SOLN: 1.0000 | Freq: Once | ORAL | 0 refills | Status: AC

## 2023-04-06 NOTE — Patient Instructions (Addendum)
Your provider has requested that you go to the basement level for lab work before leaving today. Press "B" on the elevator. The lab is located at the first door on the left as you exit the elevator.  Please follow up in 12 months. Give [place] a call at [phone] to schedule an appointment.   You have been scheduled for a colonoscopy. Please follow written instructions given to you at your visit today.  Please pick up your prep supplies at the pharmacy within the next 1-3 days. If you use inhalers (even only as needed), please bring them with you on the day of your procedure.    _______________________________________________________  If your blood pressure at your visit was 140/90 or greater, please contact your primary care physician to follow up on this.  _______________________________________________________  If you are age 62 or older, your body mass index should be between 23-30. Your Body mass index is 20.3 kg/m. If this is out of the aforementioned range listed, please consider follow up with your Primary Care Provider.  If you are age 20 or younger, your body mass index should be between 19-25. Your Body mass index is 20.3 kg/m. If this is out of the aformentioned range listed, please consider follow up with your Primary Care Provider.   __________________________________________________________  The [facility] providers would like to encourage you to use [facility] to communicate with providers for non-urgent requests or questions.  Due to long hold times on the telephone, sending your provider a message by [facility] may be a faster and more efficient way to get a response.  Please allow 48 business hours for a response.  Please remember that this is for non-urgent requests.   Thank you for choosing me and [facility].  Vito Cirigliano, D.O.  Low-FODMAP Eating Plan  FODMAP stands for fermentable oligosaccharides, disaccharides, monosaccharides, and polyols. These are  sugars that are hard for some people to digest. A low-FODMAP eating plan may help some people who have irritable bowel syndrome (IBS) and certain other bowel (intestinal) diseases to manage their symptoms. This meal plan can be complicated to follow. Work with a diet and nutrition specialist (dietitian) to make a low-FODMAP eating plan that is right for you. A dietitian can help make sure that you get enough nutrition from this diet. What are tips for following this plan? Reading food labels Check labels for hidden FODMAPs such as: High-fructose syrup. Honey. Agave. Natural fruit flavors. Onion or garlic powder. Choose low-FODMAP foods that contain 3-4 grams of fiber per serving. Check food labels for serving sizes. Eat only one serving at a time to make sure FODMAP levels stay low. Shopping Shop with a list of foods that are recommended on this diet and make a meal plan. Meal planning Follow a low-FODMAP eating plan for up to 6 weeks, or as told by your health care provider or dietitian. To follow the eating plan: Eliminate high-FODMAP foods from your diet completely. Choose only low-FODMAP foods to eat. You will do this for 2-6 weeks. Gradually reintroduce high-FODMAP foods into your diet one at a time. Most people should wait a few days before introducing the next new high-FODMAP food into their meal plan. Your dietitian can recommend how quickly you may reintroduce foods. Keep a daily record of what and how much you eat and drink. Make note of any symptoms that you have after eating. Review your daily record with a dietitian regularly to identify which foods you can eat and which foods  you should avoid. General tips Drink enough fluid each day to keep your urine pale yellow. Avoid processed foods. These often have added sugar and may be high in FODMAPs. Avoid most dairy products, whole grains, and sweeteners. Work with a dietitian to make sure you get enough fiber in your diet. Avoid  high FODMAP foods at meals to manage symptoms. Recommended foods Fruits Bananas, oranges, tangerines, lemons, limes, blueberries, raspberries, strawberries, grapes, cantaloupe, honeydew melon, kiwi, papaya, passion fruit, and pineapple. Limited amounts of dried cranberries, banana chips, and shredded coconut. Vegetables Eggplant, zucchini, cucumber, peppers, green beans, bean sprouts, lettuce, arugula, kale, Swiss chard, spinach, collard greens, bok choy, summer squash, potato, and tomato. Limited amounts of corn, carrot, and sweet potato. Green parts of scallions. Grains Gluten-free grains, such as rice, oats, buckwheat, quinoa, corn, polenta, and millet. Gluten-free pasta, bread, or cereal. Rice noodles. Corn tortillas. Meats and other proteins Unseasoned beef, pork, poultry, or fish. Eggs. Tomasa Blase. Tofu (firm) and tempeh. Limited amounts of nuts and seeds, such as almonds, walnuts, [place] nuts, pecans, peanuts, nut butters, pumpkin seeds, chia seeds, and sunflower seeds. Dairy Lactose-free milk, yogurt, and kefir. Lactose-free cottage cheese and ice cream. Non-dairy milks, such as almond, coconut, hemp, and rice milk. Non-dairy yogurt. Limited amounts of goat cheese, brie, mozzarella, parmesan, swiss, and other hard cheeses. Fats and oils Butter-free spreads. Vegetable oils, such as olive, canola, and sunflower oil. Seasoning and other foods Artificial sweeteners with names that do not end in "ol," such as aspartame, saccharine, and stevia. Maple syrup, white table sugar, raw sugar, brown sugar, and molasses. Mayonnaise, soy sauce, and tamari. Fresh basil, coriander, parsley, rosemary, and thyme. Beverages Water and mineral water. Sugar-sweetened soft drinks. Small amounts of orange juice or cranberry juice. Black and green tea. Most dry wines. Coffee. The items listed above may not be a complete list of foods and beverages you can eat. Contact a dietitian for more information. Foods to  avoid Fruits Fresh, dried, and juiced forms of apple, pear, watermelon, peach, plum, cherries, apricots, blackberries, boysenberries, figs, nectarines, and mango. Avocado. Vegetables Chicory root, artichoke, asparagus, cabbage, snow peas, Brussels sprouts, broccoli, sugar snap peas, mushrooms, celery, and cauliflower. Onions, garlic, leeks, and the white part of scallions. Grains Wheat, including kamut, durum, and semolina. Barley and bulgur. Couscous. Wheat-based cereals. Wheat noodles, bread, crackers, and pastries. Meats and other proteins Fried or fatty meat. Sausage. Cashews and pistachios. Soybeans, baked beans, black beans, chickpeas, kidney beans, fava beans, navy beans, lentils, black-eyed peas, and split peas. Dairy Milk, yogurt, ice cream, and soft cheese. Cream and sour cream. Milk-based sauces. Custard. Buttermilk. Soy milk. Seasoning and other foods Any sugar-free gum or candy. Foods that contain artificial sweeteners such as sorbitol, mannitol, isomalt, or xylitol. Foods that contain honey, high-fructose corn syrup, or agave. Bouillon, vegetable stock, beef stock, and chicken stock. Garlic and onion powder. Condiments made with onion, such as hummus, chutney, pickles, relish, salad dressing, and salsa. Tomato paste. Beverages Chicory-based drinks. Coffee substitutes. Chamomile tea. Fennel tea. Sweet or fortified wines such as port or sherry. Diet soft drinks made with isomalt, mannitol, maltitol, sorbitol, or xylitol. Apple, pear, and mango juice. Juices with high-fructose corn syrup. The items listed above may not be a complete list of foods and beverages you should avoid. Contact a dietitian for more information. Summary FODMAP stands for fermentable oligosaccharides, disaccharides, monosaccharides, and polyols. These are sugars that are hard for some people to digest. A low-FODMAP eating plan is a short-term diet that  helps to ease symptoms of certain bowel diseases. The eating  plan usually lasts up to 6 weeks. After that, high-FODMAP foods are reintroduced gradually and one at a time. This can help you find out which foods may be causing symptoms. A low-FODMAP eating plan can be complicated. It is best to work with a dietitian who has experience with this type of plan. This information is not intended to replace advice given to you by your health care provider. Make sure you discuss any questions you have with your health care provider. Document Revised: 04/16/2020 Document Reviewed: 04/16/2020 Elsevier Patient Education  2023 [employer].

## 2023-04-06 NOTE — Progress Notes (Signed)
Chief Complaint:    Crohn's disease  GI History: 62 year old female initially seen in the GI clinic 09/16/2020 for evaluation of hematochezia and IDA.  History of IDA dating back to 09/2011 with intermittent IV iron infusions over the years.  EGD/colonoscopy completed in 02/2016 for evaluation of IDA as outlined below.     Endoscopic/GI Evaluation history: -Per patient reports history of NSAID induced ulcers with bowel obstruction 2/2 stricture in ~1998. Ex-lap with appendectomy performed. UGI series at that time n/f SB obstruction per patient, and treated with NSAID cessation and conservative management. She had extensive w/u for migraines, but eventuallly resorted to NSAIDs again as the only effective tx. -Colonoscopy (11/2002): Normal except for erosion in the cecum.  Random biopsies demonstrate active colitis without chronic changes -EGD (2007): Normal including normal small bowel biopsies -Colonoscopy (01/15/2010): Normal.  Rectal hyperplastic polyps.  Repeat 10 years -09/16/2011: Ferritin 6 -CT enterography (09/2011): Distal ileum with several short segments of bowel wall thickening and mucosal enhancement (ileitis).  No obstruction -EGD (02/16/2016, Dr. Reece Agar): Normal.  Biopsies negative for celiac -Colonoscopy (02/16/2016, Dr. Reece Agar): Shallow erosion at IC valve (biopsy: Nonspecific ulcer), Otherwise normal colon.  Unable to intubate IC valve and examined terminal ileum - 07/2020: Ferritin <4, iron 29, TIBC 474, sat 6%.  H/H 8.8/29.9 with MCV/RDW 86/30.7. -Colonoscopy (11/12/2020, Dr. Barron Alvine): nonbleeding grade 1 internal hemorrhoids, moderate stenosis of the ileocecal valve which was eventually traversed, second stenosis approximately 2 cm proximal to the ICV which could not be traversed, visualized mucosa in the terminal ileum was erythematous and with ulcer x2; pathology showed no active or chronic inflammatory changes to support or suggest Crohn's disease - EGD (11/12/2020,  Dr. Barron Alvine): LA grade A reflux esophagitis and gastritis; placed on pantoprazole 40 twice a day and Carafate; pathology showed mild gastritis with no evidence of H. pylori - Hospital admission 03/2021 for small bowel obstruction - CT (04/04/2021): Fluid-filled mildly dilated distal small bowel with mucosal thickening and stricturing at the terminal ileum suggesting partial obstruction at this location.  Possible duodenal ulcer - MR Enterography (04/07/2021): Persistent wall thickening and enhancement of the distal and terminal ileum.  Suspect stricture narrowing likely from chronic inflammation (chronic underlying scarring change with fibrosis) plus active inflammation causing combination of mild partial functional mechanical obstruction. - 04/2021: IV iron - 04/20/2021: GI follow-up: Was feeling better.  Reports only taking the budesonide 9 mg/day for couple days, then reduce to 6 mg/day for a couple days, and was at 3 mg/day at that time due to concerns about medication induced symptoms. - 05/05/2021: Case discussed at GI multidisciplinary conference.  Higher suspicion for stricturing Crohn's Disease rather than NSAID induced since she has very rare NSAID use since age 23 and none in the last 10-12 years.  Imaging with 10+ centimeter stricture in the distal ileum with some superimposed inflammatory changes.  Recommended trial of prednisone but patient declined. - Evaluated by Dr. Luciano Cutter at [facility]. - 05/26/2021: Underwent right hemicolectomy and 16 cm of ileal resection at [facility].  Pathology suspicious for Crohn's Disease. - 07/27/2021: Follow-up in GI clinic.  Had mild flare approximately 6 weeks post op.  Had restarted budesonide 4.5 mg/day with improvement. - 12/21/2021: ER evaluation for palpitations, nausea, lower abdominal pain, rectal bleeding.  H/H at baseline, normal WBC, CMP, INR.  FOBT positive.  CRP 2.3, normal ESR.  Calprotectin 320.  CT with diffuse wall thickening and mucosal  enhancement in the sigmoid without obstruction.  Recommended repeat  CT in 3 months.  Treated with prednisone.  Again recommended Entyvio or other biologic therapy. Declined. - 01/26/2022: Normal ESR, CRP, calprotectin - 02/11/2022: GI follow-up: Feeling better after weaning off prednisone.  Restarted budesonide     Separately, reflux well controlled since reducing Protonix to 20 mg/day.  HPI:     Patient is a 62 y.o. female presenting to the Gastroenterology Clinic for follow-up.  Last seen by me on 09/07/2022.  Again declined escalation of therapy and was continued on budesonide 6 mg daily. - 09/05/2022: Fecal calprotectin 22, GI PCR panel negative.  Normal CBC, ESR, CRP - 09/07/2022: CT A/P: Essentially normal. Benign liver cyst (vs benign hemangioma) with otherwise normal liver, gallbladder, pancreas, spleen.  No bowel wall thickening, obstruction, inflammation, abscess, or other acute intra-abdominal pathology.  - 04/05/2023: Normal CBC, CMP, ferritin, iron (sat 43%)   Had easy bruising and skin tears on legs; was seen in Dermatology Clinic and was told this was 2/2 budesonide, so she stopped taking in Feb.   Since then, has been having intermittent diarrhea. Over the last week with episodic loose stools. No hematochezia. Can also have 2-3 days of no sxs at all.  Symptoms tend to be related to diet.   Please note, since last appointment she was a no-show a few times, including no-show last week, and late for appointment today.    Review of systems:     No chest pain, no SOB, no fevers, no urinary sx   Past Medical History:  Diagnosis Date   Anxiety    Breast mass, right    Chronic GI bleeding 01/27/2016   Complication of anesthesia    Depression    Dysrhythmia    PVC,s, atrial tachycardia   Gastric ulcer    Hiatal hernia    Iron deficiency anemia 11/07/2011   Irregular heart rate    PONV (postoperative nausea and vomiting)    SBO (small bowel obstruction)    in 2 different  places per patient     Patient's surgical history, family medical history, social history, medications and allergies were all reviewed in Epic    Current Outpatient Medications  Medication Sig Dispense Refill   acetaminophen (TYLENOL) 500 MG tablet Take 500 mg by mouth every 6 (six) hours as needed for mild pain.     ALPRAZolam (XANAX) 0.5 MG tablet Take 0.25 mg by mouth daily as needed for anxiety.      cholecalciferol (VITAMIN D) 1000 units tablet Take 1,000 Units by mouth daily as needed.      cyclobenzaprine (FLEXERIL) 10 MG tablet Take 5-10 mg by mouth 3 (three) times daily.     metoprolol succinate (TOPROL-XL) 25 MG 24 hr tablet TAKE 1 TABLET BY MOUTH EVERY DAY 90 tablet 3   sertraline (ZOLOFT) 50 MG tablet Take 1 tablet by mouth daily.     vitamin B-12 (CYANOCOBALAMIN) 1000 MCG tablet Take 1,000 mcg by mouth daily as needed.      budesonide (ENTOCORT EC) 3 MG 24 hr capsule TAKE 2 CAPSULES BY MOUTH ONCE DAILY AS DIRECTED (Patient not taking: Reported on 04/06/2023) 180 capsule 0   No current facility-administered medications for this visit.    Physical Exam:     BP 138/78   Pulse 68   Ht 5\' 5"  (1.651 m)   Wt 122 lb (55.3 kg)   BMI 20.30 kg/m   GENERAL:  Pleasant female in NAD PSYCH: : Cooperative, normal affect  NEURO: Alert and oriented x 3, no  focal neurologic deficits   IMPRESSION and PLAN:    1) Crohn's Disease 2) History of right hemicolectomy and distal ileal resection 62 year old female with stricturing type ileal Crohn's Disease. Diagnosis established at the time of ileal resection for chronic, obstructing stricture.  No prior evidence of colonic disease.  Most recent inflammatory markers were normal, including ESR, CRP, calprotectin. She has since discontinued budesonide, and has intermittent symptoms, but have strong suspicion for overlapping IBS as below.  We again discussed escalation of therapy, which she is opposed.  - Check ESR, CRP, fecal calprotectin  today.  If normal, again credence for symptomatology to be primarily IBS overlap.  If elevated, we can again discuss IBD therapy, which she has been historically opposed to - No longer on budesonide - Routine micronutrient assessment today - Schedule colonoscopy for surveillance - Continue routine dermatology follow-up   - ESR, CRP, Calprotectin - Micronutrient panel: B12, folate, Vit D - Colo for surveillance - Discussed Entyvio - Low FODMAP diet - RTC in 12 months or sooner prn    3) Abdominal pain 4) Change in bowel habits 5) IBS overlap Large portion of her symptoms have dietary component to them.  Again discussed IBS overlap today.  Most recent imaging was otherwise without evidence of pSBO or active inflammation. - Trial low FODMAP diet - Checking inflammatory markers as above -Not sure that she is ever trialed Bentyl that has been previously prescribed or IBgard which has been previously recommended  6) Iron deficiency anemia - Resolved with IV iron   The indications, risks, and benefits of colonoscopy were explained to the patient in detail. Risks include but are not limited to bleeding, perforation, adverse reaction to medications, and cardiopulmonary compromise. Sequelae include but are not limited to the possibility of surgery, hospitalization, and mortality. The patient verbalized understanding and wished to proceed. All questions answered, referred to the scheduler and bowel prep ordered. Further recommendations pending results of the exam.            Shellia Cleverly ,DO, FACG 04/06/2023, 10:34 AM

## 2023-04-07 ENCOUNTER — Ambulatory Visit: Payer: BC Managed Care – PPO | Admitting: Hematology & Oncology

## 2023-04-07 ENCOUNTER — Inpatient Hospital Stay: Payer: BC Managed Care – PPO | Admitting: Hematology & Oncology

## 2023-04-07 ENCOUNTER — Inpatient Hospital Stay: Payer: BC Managed Care – PPO

## 2023-04-20 ENCOUNTER — Ambulatory Visit: Payer: BC Managed Care – PPO

## 2023-04-20 DIAGNOSIS — I471 Supraventricular tachycardia, unspecified: Secondary | ICD-10-CM | POA: Diagnosis not present

## 2023-04-20 DIAGNOSIS — K50919 Crohn's disease, unspecified, with unspecified complications: Secondary | ICD-10-CM

## 2023-04-20 DIAGNOSIS — E782 Mixed hyperlipidemia: Secondary | ICD-10-CM | POA: Diagnosis not present

## 2023-04-20 DIAGNOSIS — K582 Mixed irritable bowel syndrome: Secondary | ICD-10-CM

## 2023-04-20 DIAGNOSIS — Z0000 Encounter for general adult medical examination without abnormal findings: Secondary | ICD-10-CM | POA: Diagnosis not present

## 2023-04-20 DIAGNOSIS — K219 Gastro-esophageal reflux disease without esophagitis: Secondary | ICD-10-CM | POA: Diagnosis not present

## 2023-04-20 DIAGNOSIS — N393 Stress incontinence (female) (male): Secondary | ICD-10-CM | POA: Diagnosis not present

## 2023-04-24 ENCOUNTER — Inpatient Hospital Stay: Payer: BC Managed Care – PPO

## 2023-04-25 DIAGNOSIS — N952 Postmenopausal atrophic vaginitis: Secondary | ICD-10-CM | POA: Diagnosis not present

## 2023-04-25 DIAGNOSIS — Z1389 Encounter for screening for other disorder: Secondary | ICD-10-CM | POA: Diagnosis not present

## 2023-04-25 DIAGNOSIS — Z01419 Encounter for gynecological examination (general) (routine) without abnormal findings: Secondary | ICD-10-CM | POA: Diagnosis not present

## 2023-04-25 DIAGNOSIS — Z780 Asymptomatic menopausal state: Secondary | ICD-10-CM | POA: Diagnosis not present

## 2023-04-26 ENCOUNTER — Ambulatory Visit: Payer: BC Managed Care – PPO | Admitting: Hematology & Oncology

## 2023-04-27 LAB — CALPROTECTIN: Calprotectin: 65 mcg/g

## 2023-05-15 DIAGNOSIS — L239 Allergic contact dermatitis, unspecified cause: Secondary | ICD-10-CM | POA: Diagnosis not present

## 2023-05-18 ENCOUNTER — Encounter: Payer: BC Managed Care – PPO | Admitting: Gastroenterology

## 2023-05-18 ENCOUNTER — Encounter: Payer: Self-pay | Admitting: Gastroenterology

## 2023-06-02 ENCOUNTER — Encounter: Payer: BC Managed Care – PPO | Admitting: Gastroenterology

## 2023-07-05 ENCOUNTER — Other Ambulatory Visit: Payer: Self-pay

## 2023-07-05 DIAGNOSIS — D508 Other iron deficiency anemias: Secondary | ICD-10-CM

## 2023-07-06 ENCOUNTER — Inpatient Hospital Stay: Payer: BC Managed Care – PPO | Admitting: Hematology & Oncology

## 2023-07-06 ENCOUNTER — Inpatient Hospital Stay: Payer: BC Managed Care – PPO | Attending: Hematology & Oncology

## 2023-10-17 DIAGNOSIS — L03011 Cellulitis of right finger: Secondary | ICD-10-CM | POA: Diagnosis not present

## 2023-10-17 DIAGNOSIS — L2389 Allergic contact dermatitis due to other agents: Secondary | ICD-10-CM | POA: Diagnosis not present

## 2023-10-17 DIAGNOSIS — D485 Neoplasm of uncertain behavior of skin: Secondary | ICD-10-CM | POA: Diagnosis not present

## 2023-10-17 DIAGNOSIS — T7840XA Allergy, unspecified, initial encounter: Secondary | ICD-10-CM | POA: Diagnosis not present

## 2024-01-24 ENCOUNTER — Inpatient Hospital Stay: Payer: BC Managed Care – PPO | Admitting: Hematology & Oncology

## 2024-01-24 ENCOUNTER — Inpatient Hospital Stay: Payer: BC Managed Care – PPO | Attending: Internal Medicine

## 2024-02-22 DIAGNOSIS — L03011 Cellulitis of right finger: Secondary | ICD-10-CM | POA: Diagnosis not present

## 2024-02-22 DIAGNOSIS — Z862 Personal history of diseases of the blood and blood-forming organs and certain disorders involving the immune mechanism: Secondary | ICD-10-CM | POA: Diagnosis not present

## 2024-03-10 ENCOUNTER — Other Ambulatory Visit: Payer: Self-pay | Admitting: Internal Medicine

## 2024-03-15 ENCOUNTER — Encounter: Payer: Self-pay | Admitting: Internal Medicine

## 2024-03-15 ENCOUNTER — Ambulatory Visit: Payer: BC Managed Care – PPO | Attending: Internal Medicine | Admitting: Internal Medicine

## 2024-03-15 VITALS — BP 122/82 | HR 82 | Ht 65.0 in | Wt 132.6 lb

## 2024-03-15 DIAGNOSIS — I4719 Other supraventricular tachycardia: Secondary | ICD-10-CM | POA: Diagnosis not present

## 2024-03-15 DIAGNOSIS — I951 Orthostatic hypotension: Secondary | ICD-10-CM

## 2024-03-15 NOTE — Progress Notes (Signed)
 Patient Care Team: Merri Brunette, MD as PCP - General (Internal Medicine) Duke Salvia, MD as PCP - Cardiology (Cardiology) Duke Salvia, MD as PCP - Electrophysiology (Cardiology)   HPI  Carolyn Chaney is a 63 y.o. female seen in follow-up for palpitations associated with atrial tachycardia. She saw Dr. Ladona Ridgel for consideration of ablation and elected to pursue therapy with flecainide--that was forsaken and she now takes low dose metoprolol with great benefit  She had concern about coronary disease --  CaScore 0   Records and Results Reviewed hospital records from recent endoscopy undertaken for Fe deficiency anemia in setting of Crohns disease identified 4/22 hospitalized at that time for an obstruction.  Ultimately underwent surgery at [facility] and is currently taking Budesinide  Crohn's disease.  Problems with intermittent anemia.  This has been better  The patient denies chest pain, nocturnal dyspnea, orthopnea or peripheral edema.  There have been no syncope.  Complains of palpitations which are frequent both racing and pausing, associated with lightheadedness as well as shortness of breath..    Date Cr K Hgb  11/22 0.81 4.6 11.1<<9.6  4/23 0.8 3.3 9.7  9/23 0.8  13.2  4/24 0.82 4.2 13.4  3/25   12      DATE TEST EF   3/22 CaScore (CE)    % Score=0                    Past Medical History:  Diagnosis Date   Anxiety    Breast mass, right    Chronic GI bleeding 01/27/2016   Complication of anesthesia    Depression    Dysrhythmia    PVC,s, atrial tachycardia   Gastric ulcer    Hiatal hernia    Iron deficiency anemia 11/07/2011   Irregular heart rate    PONV (postoperative nausea and vomiting)    SBO (small bowel obstruction) (HCC)    in 2 different places per patient     Past Surgical History:  Procedure Laterality Date   APPENDECTOMY     BOWEL RESECTION  05/26/2021   BREAST EXCISIONAL BIOPSY Right 1980   Benign   BREAST EXCISIONAL BIOPSY  Right 2020   BREAST LUMPECTOMY WITH RADIOACTIVE SEED LOCALIZATION Right 10/09/2019   Procedure: RIGHT BREAST LUMPECTOMY WITH RADIOACTIVE SEED LOCALIZATION;  Surgeon: Claud Kelp, MD;  Location: [facility];  Service: General;  Laterality: Right;   BREAST SURGERY     x2   COLONOSCOPY WITH PROPOFOL N/A 02/16/2016   Procedure: COLONOSCOPY WITH PROPOFOL;  Surgeon: Charolett Bumpers, MD;  Location: [facility];  Service: Endoscopy;  Laterality: N/A;   ESOPHAGOGASTRODUODENOSCOPY (EGD) WITH PROPOFOL N/A 02/16/2016   Procedure: ESOPHAGOGASTRODUODENOSCOPY (EGD) WITH PROPOFOL;  Surgeon: Charolett Bumpers, MD;  Location: [facility];  Service: Endoscopy;  Laterality: N/A;   HERNIA REPAIR     bilateral inguinal hernia as child   TONSILLECTOMY      Current Outpatient Medications  Medication Sig Dispense Refill   acetaminophen (TYLENOL) 500 MG tablet Take 500 mg by mouth every 6 (six) hours as needed for mild pain.     ALPRAZolam (XANAX) 0.5 MG tablet Take 0.25 mg by mouth daily as needed for anxiety.      cholecalciferol (VITAMIN D) 1000 units tablet Take 1,000 Units by mouth daily as needed.      cyclobenzaprine (FLEXERIL) 10 MG tablet Take 5-10 mg by mouth 3 (three) times daily as needed.  metoprolol succinate (TOPROL-XL) 25 MG 24 hr tablet TAKE 1 TABLET BY MOUTH EVERY DAY 90 tablet 0   sertraline (ZOLOFT) 50 MG tablet Take 1 tablet by mouth daily.     vitamin B-12 (CYANOCOBALAMIN) 1000 MCG tablet Take 1,000 mcg by mouth daily as needed.      No current facility-administered medications for this visit.    Allergies  Allergen Reactions   Nsaids Other (See Comments)    Bleeding    Other     Laxatives and fleet emema as well and antihistamines   Venofer [Iron Sucrose] Other (See Comments)    Syncope, Numbness and tingling in arms and hands.   Feraheme [Ferumoxytol] Other (See Comments)    Syncope, Numbness and tingling in arms and hands.   Zofran [Ondansetron]     malice    Zithromax [Azithromycin]        Physical Exam BP 122/82 Comment: standing @ 3 mins  Pulse 82   Ht 5\' 5"  (1.651 m)   Wt 132 lb 9.6 oz (60.1 kg)   SpO2 98%   BMI 22.07 kg/m  Well developed and nourished in no acute distress HENT normal Neck supple Clear Regular rate and rhythm, no murmurs or gallops Abd-soft with active BS No Clubbing cyanosis edema Skin-warm and dry A & Oriented  Grossly normal sensory and motor function  ECG sinus at 68 Intervals 17/09/39 RSR prime  Event recorder 12/23 was reviewed.  Atrial tachycardia was seen again rare PVCs and PACs  Assessment and  Plan  Atrial tachycardia  Crohns disease  Fe Def anemia   Orthostatic and heat intolerance  Anxiety  Hypertension   Palpitations have been worse.  We discussed various strategies including taking her metoprolol in the morning instead of at night, increasing her metoprolol, considering antiarrhythmic therapy with something like low-dose flecainide (i.e. 12.5--25 mg twice daily), integrative therapies and biofeedback or repeat monitoring and consideration of things like catheter ablation.  We have elected to undertake it in the following order 1-changed to the a.m., 2-if symptoms persist increased from 25--37.5, 3-if symptoms persist try low-dose flecainide

## 2024-03-15 NOTE — Patient Instructions (Addendum)
 Medication Instructions:  Your physician recommends that you continue on your current medications as directed. Please refer to the Current Medication list given to you today.  ** Try taking your current dose of Metoprolol 25mg  in the morning to see if this helps decrease palpitations.  If not Dr Graciela Husbands recommends increasing Metoprolol 25mg  to 1-1/2 (37.5mg ) tablets  by mouth daily. *If you need a refill on your cardiac medications before your next appointment, please call your pharmacy*  Lab Work: None ordered.  If you have labs (blood work) drawn today and your tests are completely normal, you will receive your results only by: MyChart Message (if you have MyChart) OR A paper copy in the mail If you have any lab test that is abnormal or we need to change your treatment, we will call you to review the results.  Testing/Procedures: None ordered.   Follow-Up: At [facility], you and your health needs are our priority.  As part of our continuing mission to provide you with exceptional heart care, our providers are all part of one team.  This team includes your primary Cardiologist (physician) and Advanced Practice Providers or APPs (Physician Assistants and Nurse Practitioners) who all work together to provide you with the care you need, when you need it.  Your next appointment:   12 months      1st Floor: - Lobby - Registration  - Pharmacy  - Lab - Cafe  2nd Floor: - PV Lab - Diagnostic Testing (echo, CT, nuclear med)  3rd Floor: - Vacant  4th Floor: - TCTS (cardiothoracic surgery) - AFib Clinic - Structural Heart Clinic - Vascular Surgery  - Vascular Ultrasound  5th Floor: - [facility] Cardiology (general and EP) - Clinical Pharmacy for coumadin, hypertension, lipid, weight-loss medications, and med management appointments    Valet parking services will be available as well.

## 2024-04-24 DIAGNOSIS — R300 Dysuria: Secondary | ICD-10-CM | POA: Diagnosis not present

## 2024-04-24 DIAGNOSIS — Z8619 Personal history of other infectious and parasitic diseases: Secondary | ICD-10-CM | POA: Diagnosis not present

## 2024-04-24 DIAGNOSIS — N9089 Other specified noninflammatory disorders of vulva and perineum: Secondary | ICD-10-CM | POA: Diagnosis not present

## 2024-04-25 DIAGNOSIS — K50118 Crohn's disease of large intestine with other complication: Secondary | ICD-10-CM | POA: Diagnosis not present

## 2024-04-25 DIAGNOSIS — Z0000 Encounter for general adult medical examination without abnormal findings: Secondary | ICD-10-CM | POA: Diagnosis not present

## 2024-04-25 DIAGNOSIS — I471 Supraventricular tachycardia, unspecified: Secondary | ICD-10-CM | POA: Diagnosis not present

## 2024-04-25 DIAGNOSIS — E559 Vitamin D deficiency, unspecified: Secondary | ICD-10-CM | POA: Diagnosis not present

## 2024-04-25 DIAGNOSIS — E049 Nontoxic goiter, unspecified: Secondary | ICD-10-CM | POA: Diagnosis not present

## 2024-04-25 DIAGNOSIS — E782 Mixed hyperlipidemia: Secondary | ICD-10-CM | POA: Diagnosis not present

## 2024-04-25 DIAGNOSIS — N393 Stress incontinence (female) (male): Secondary | ICD-10-CM | POA: Diagnosis not present

## 2024-04-25 DIAGNOSIS — K219 Gastro-esophageal reflux disease without esophagitis: Secondary | ICD-10-CM | POA: Diagnosis not present

## 2024-05-09 DIAGNOSIS — Z124 Encounter for screening for malignant neoplasm of cervix: Secondary | ICD-10-CM | POA: Diagnosis not present

## 2024-05-09 DIAGNOSIS — Z01419 Encounter for gynecological examination (general) (routine) without abnormal findings: Secondary | ICD-10-CM | POA: Diagnosis not present

## 2024-05-09 DIAGNOSIS — Z130 Encounter for screening for diseases of the blood and blood-forming organs and certain disorders involving the immune mechanism: Secondary | ICD-10-CM | POA: Diagnosis not present

## 2024-05-09 DIAGNOSIS — Z1151 Encounter for screening for human papillomavirus (HPV): Secondary | ICD-10-CM | POA: Diagnosis not present

## 2024-05-09 DIAGNOSIS — N3946 Mixed incontinence: Secondary | ICD-10-CM | POA: Diagnosis not present

## 2024-06-11 ENCOUNTER — Other Ambulatory Visit: Payer: Self-pay | Admitting: Internal Medicine

## 2024-06-12 ENCOUNTER — Telehealth: Payer: Self-pay | Admitting: Internal Medicine

## 2024-06-12 MED ORDER — METOPROLOL SUCCINATE ER 25 MG PO TB24: ORAL_TABLET | ORAL | 3 refills | Status: AC

## 2024-06-12 NOTE — Telephone Encounter (Signed)
 Patient reports she has been taking Toprol  XL 37.5 mg daily (1 tablet in the evening and a half tablet in the morning). She has been out of this medication and needs refill sent in.  New Rx sent reflecting change in dosing as recommended at last office visit with Dr. Fernande.

## 2024-06-12 NOTE — Telephone Encounter (Signed)
 Pt c/o medication issue:  1. Name of Medication: metoprolol  succinate (TOPROL -XL) 25 MG 24 hr tablet   2. How are you currently taking this medication (dosage and times per day)?    3. Are you having a reaction (difficulty breathing--STAT)? no  4. What is your medication issue? Patient needs a new prescription sent, that states 1.5 mg for medication. Ise lease advise

## 2024-06-12 NOTE — Telephone Encounter (Signed)
 Refused, because pt's medication was already sent to pt's pharmacy. Confirmation received.

## 2024-06-12 NOTE — Telephone Encounter (Signed)
 Pt of Dr. Fernande. Please advise on this refill.

## 2024-08-28 ENCOUNTER — Encounter: Payer: Self-pay | Admitting: Obstetrics and Gynecology

## 2024-08-28 ENCOUNTER — Ambulatory Visit: Admitting: Obstetrics and Gynecology

## 2024-08-28 VITALS — BP 130/81 | HR 73 | Ht 63.5 in | Wt 132.0 lb

## 2024-08-28 DIAGNOSIS — N3281 Overactive bladder: Secondary | ICD-10-CM | POA: Diagnosis not present

## 2024-08-28 DIAGNOSIS — N393 Stress incontinence (female) (male): Secondary | ICD-10-CM

## 2024-08-28 DIAGNOSIS — M62838 Other muscle spasm: Secondary | ICD-10-CM | POA: Diagnosis not present

## 2024-08-28 DIAGNOSIS — R350 Frequency of micturition: Secondary | ICD-10-CM

## 2024-08-28 LAB — POCT URINALYSIS DIP (CLINITEK)
Bilirubin, UA: NEGATIVE
Blood, UA: NEGATIVE
Glucose, UA: NEGATIVE mg/dL
Ketones, POC UA: NEGATIVE mg/dL
Leukocytes, UA: NEGATIVE
Nitrite, UA: NEGATIVE
POC PROTEIN,UA: NEGATIVE
Spec Grav, UA: 1.02 (ref 1.010–1.025)
Urobilinogen, UA: 0.2 U/dL
pH, UA: 6 (ref 5.0–8.0)

## 2024-08-28 MED ORDER — TROSPIUM CHLORIDE 20 MG PO TABS
20.0000 mg | ORAL_TABLET | Freq: Two times a day (BID) | ORAL | 5 refills | Status: AC
Start: 2024-08-28 — End: ?

## 2024-08-28 NOTE — Patient Instructions (Addendum)
 Today we talked about ways to manage bladder urgency such as altering your diet to avoid irritative beverages and foods (bladder diet) as well as attempting to decrease stress and other exacerbating factors.  Stop drinking 2-3 hours prior to bedtime.   The Most Bothersome Foods* The Least Bothersome Foods*  Coffee - Regular & Decaf Tea - caffeinated Carbonated beverages - cola, non-colas, diet & caffeine-free Alcohols - Beer, Red Wine, White Wine, [address] - Grapefruit, [place], Orange, [employer] - Cranberry, Grapefruit, Orange, Pineapple Vegetables - Tomato & Tomato Products Flavor Enhancers - Hot peppers, Spicy foods, Chili, Horseradish, Vinegar, Monosodium glutamate (MSG) Artificial Sweeteners - NutraSweet, Sweet 'N Low, Equal (sweetener), Saccharin Ethnic foods - [place], [place], [place] food [employer] - low-fat & whole Fruits - Bananas, Blueberries, Honeydew melon, Pears, Raisins, Watermelon Vegetables - Broccoli, [address] Sprouts, [place], Carrots, Cauliflower, [place], Cucumber, Mushrooms, Peas, Radishes, Squash, Zucchini, White potatoes, Sweet potatoes & yams Poultry - Chicken, Eggs, [place], [employer] - Beef, Diplomatic Services operational officer, Lamb Seafood - Shrimp, [place] fish, Salmon Grains - Oat, Rice Snacks - Pretzels, Popcorn  *Mitch ALF et al. Diet and its role in interstitial cystitis/bladder pain syndrome (IC/BPS) and comorbid conditions. [employer]. BJU Int. 2012 Jan 11.   We discussed the symptoms of overactive bladder (OAB), which include urinary urgency, urinary frequency, night-time urination, with or without urge incontinence.  We discussed management including behavioral therapy (decreasing bladder irritants by following a bladder diet, urge suppression strategies, timed voids, bladder retraining), physical therapy, medication; and for refractory cases posterior tibial nerve stimulation, sacral neuromodulation, and intravesical botulinum toxin injection.

## 2024-08-28 NOTE — Assessment & Plan Note (Addendum)
-   We discussed the symptoms of overactive bladder (OAB), which include urinary urgency, urinary frequency, nocturia, with or without urge incontinence.  While we do not know the exact etiology of OAB, several treatment options exist. We discussed management including behavioral therapy (decreasing bladder irritants, urge suppression strategies, timed voids, bladder retraining), physical therapy, medication; for refractory cases posterior tibial nerve stimulation, sacral neuromodulation, and intravesical botulinum toxin injection.  - She prefers to try another medication (no improvement with ditropan and myrbetriq) For anticholinergic medications, we discussed the potential side effects of anticholinergics including dry eyes, dry mouth, constipation, cognitive impairment and urinary retention. - She is also interested in pelvic PT, referral placed - Handouts provided on third line therapies for her to consider - Also advised to eliminate bladder irritants and drink more water

## 2024-08-28 NOTE — Progress Notes (Signed)
 New Patient Evaluation and Consultation  Referring Provider: Key, Hargis HERO, NP PCP: Clarice Nottingham, MD Date of Service: 08/28/2024  SUBJECTIVE Chief Complaint: New Patient (Initial Visit) Carolyn Chaney is a 63 y.o. female here today for urinary incontinence.)  History of Present Illness: Carolyn Chaney is a 63 y.o. White or Caucasian female seen in consultation at the request of NP Hargis Galt for evaluation of incontinence.     Urinary Symptoms: Leaks urine with cough/ sneeze, laughing, with a full bladder, with movement to the bathroom, with urgency, and while asleep Leaks numerous time(s) per day- usually with urgency, more rare with SUI Pad use: 3-4 pads per day and diapers at night Patient is bothered by UI symptoms. Has a lot of irritation and burning from the wetness.  She has tried myrbetriq and ditropan in the past and has not seen improvement.   Day time voids 20+.  Nocturia: 3 times per night to void. Voiding dysfunction:  empties bladder well.  Patient does not use a catheter to empty bladder.  When urinating, patient feels dribbling after finishing and the need to urinate multiple times in a row Drinks: 12oz coffee, 20oz diet coke, 20 oz water per day. Usually drinks water before bed.   UTIs: 0 UTI's in the last year.   Denies history of blood in urine and kidney or bladder stones   Pelvic Organ Prolapse Symptoms:                  Patient Denies a feeling of a bulge the vaginal area.  Bowel Symptom: Bowel movements: 1 time(s) every other day Stool consistency: soft  Straining: no.  Splinting: no.  Incomplete evacuation: no.  Patient Denies accidental bowel leakage / fecal incontinence Bowel regimen: none  HM Colonoscopy          Upcoming     Colonoscopy (Every 10 Years) Next due on 11/12/2030    11/12/2020  COLONOSCOPY   Only the first 1 history entries have been loaded, but more history exists.                Sexual Function Sexually active:  yes.  Sexual orientation: heterosexual Pain with sex: Yes, at the vaginal opening, deep in the pelvis, has discomfort due to dryness She is not using vaginal estrogen cream regularly  Pelvic Pain Denies pelvic pain   Past Medical History:  Past Medical History:  Diagnosis Date   Anxiety    Breast mass, right    Chronic GI bleeding 01/27/2016   Complication of anesthesia    Crohn disease (HCC)    Depression    Dysrhythmia    PVC,s, atrial tachycardia   Gastric ulcer    Hiatal hernia    Iron  deficiency anemia 11/07/2011   Irregular heart rate    PONV (postoperative nausea and vomiting)    SBO (small bowel obstruction) (HCC)    in 2 different places per patient      Past Surgical History:   Past Surgical History:  Procedure Laterality Date   APPENDECTOMY     BOWEL RESECTION  05/26/2021   BREAST EXCISIONAL BIOPSY Right 1980   Benign   BREAST EXCISIONAL BIOPSY Right 2020   BREAST LUMPECTOMY WITH RADIOACTIVE SEED LOCALIZATION Right 10/09/2019   Procedure: RIGHT BREAST LUMPECTOMY WITH RADIOACTIVE SEED LOCALIZATION;  Surgeon: Gail Favorite, MD;  Location: [facility];  Service: General;  Laterality: Right;   BREAST SURGERY     x2   COLONOSCOPY WITH  PROPOFOL  N/A 02/16/2016   Procedure: COLONOSCOPY WITH PROPOFOL ;  Surgeon: Gladis MARLA Louder, MD;  Location: [facility];  Service: Endoscopy;  Laterality: N/A;   ESOPHAGOGASTRODUODENOSCOPY (EGD) WITH PROPOFOL  N/A 02/16/2016   Procedure: ESOPHAGOGASTRODUODENOSCOPY (EGD) WITH PROPOFOL ;  Surgeon: Gladis MARLA Louder, MD;  Location: [facility];  Service: Endoscopy;  Laterality: N/A;   HERNIA REPAIR     bilateral inguinal hernia as child   TONSILLECTOMY       Past OB/GYN History: OB History  Gravida Para Term Preterm AB Living  1 1 1   1   SAB IAB Ectopic Multiple Live Births      1    # Outcome Date GA Lbr Len/2nd Weight Sex Type Anes PTL Lv  1 Term      Vag-Vacuum   LIV    Menopausal: Denies vaginal  bleeding since menopause Last pap smear was 04/2024- neg.    Medications: Patient has a current medication list which includes the following prescription(s): acetaminophen , alprazolam , cholecalciferol, cyclobenzaprine, metoprolol  succinate, sertraline , trospium , and cyanocobalamin .   Allergies: Patient is allergic to nsaids, other, venofer  [iron  sucrose], feraheme  [ferumoxytol ], zofran  [ondansetron ], and zithromax [azithromycin].   Social History:  Social History   Tobacco Use   Smoking status: Never   Smokeless tobacco: Never  Vaping Use   Vaping status: Never Used  Substance Use Topics   Alcohol use: Yes    Alcohol/week: 0.0 standard drinks of alcohol    Comment: ocassionally   Drug use: No    Relationship status: married Patient lives with her husband.   Patient is not employed. Regular exercise: Yes: walking History of abuse: No  Family History:   Family History  Problem Relation Age of Onset   Lung cancer Mother    Hiatal hernia Mother    Heart attack Father    Heart disease Father    Heart attack Sister    Rectal cancer Sister    Diabetes Sister        type 2   Esophageal cancer Neg Hx    Uterine cancer Neg Hx    Renal cancer Neg Hx    Bladder Cancer Neg Hx      Review of Systems: Review of Systems  Constitutional:  Negative for fever, malaise/fatigue and weight loss.  Respiratory:  Negative for cough, shortness of breath and wheezing.   Cardiovascular:  Positive for palpitations. Negative for chest pain and leg swelling.  Gastrointestinal:  Negative for abdominal pain and blood in stool.  Genitourinary:  Negative for dysuria.  Musculoskeletal:  Negative for myalgias.  Skin:  Negative for rash.  Neurological:  Positive for headaches. Negative for dizziness.  Endo/Heme/Allergies:  Bruises/bleeds easily.       + hot flashes  Psychiatric/Behavioral:  Negative for depression. The patient is not nervous/anxious.      OBJECTIVE Physical Exam: Vitals:    08/28/24 1315  BP: 130/81  Pulse: 73  Weight: 132 lb (59.9 kg)  Height: 5' 3.5 (1.613 m)    Physical Exam Vitals reviewed. Exam conducted with a chaperone present.  Constitutional:      General: She is not in acute distress. Pulmonary:     Effort: Pulmonary effort is normal.  Abdominal:     General: There is no distension.     Palpations: Abdomen is soft.     Tenderness: There is no abdominal tenderness. There is no rebound.  Musculoskeletal:        General: No swelling. Normal range of motion.  Skin:  General: Skin is warm and dry.     Findings: No rash.  Neurological:     Mental Status: She is alert and oriented to person, place, and time.  Psychiatric:        Mood and Affect: Mood normal.        Behavior: Behavior normal.      GU / Detailed Urogynecologic Evaluation:  Pelvic Exam: Normal external female genitalia; Bartholin's and Skene's glands normal in appearance; urethral meatus normal in appearance, no urethral masses or discharge.   CST: negative  Speculum exam reveals normal vaginal mucosa with atrophy. Cervix normal appearance. Uterus normal single, nontender. Adnexa no mass, fullness, tenderness.     Pelvic floor strength II/V  Pelvic floor musculature: Right levator tender, Right obturator tender, Left levator tender, Left obturator tender  POP-Q:   POP-Q  -3                                            Aa   -3                                           Ba  -7.5                                              C   2.5                                            Gh  3                                            Pb  8                                            tvl   -2                                            Ap  -2                                            Bp  -5                                              D      Rectal Exam:  deferred  Post-Void Residual (PVR) by Bladder Scan: In order to evaluate bladder emptying, we discussed  obtaining a postvoid residual and patient agreed to this procedure.  Procedure:  The ultrasound unit was placed on the patient's abdomen in the suprapubic region after the patient had voided.    Post Void Residual - 08/28/24 1326       Post Void Residual   Post Void Residual 29 mL           Laboratory Results: Lab Results  Component Value Date   COLORU yellow 08/28/2024   CLARITYU clear 08/28/2024   GLUCOSEUR negative 08/28/2024   BILIRUBINUR negative 08/28/2024   SPECGRAV 1.020 08/28/2024   RBCUR negative 08/28/2024   PHUR 6.0 08/28/2024   PROTEINUR NEGATIVE 04/03/2021   UROBILINOGEN 0.2 08/28/2024   LEUKOCYTESUR Negative 08/28/2024    Lab Results  Component Value Date   CREATININE 0.82 04/05/2023   CREATININE 0.80 09/01/2022   CREATININE 0.88 06/02/2022    No results found for: HGBA1C  Lab Results  Component Value Date   HGB 13.4 04/05/2023     ASSESSMENT AND PLAN Ms. Tullo is a 63 y.o. with:  1. Overactive bladder   2. Urinary frequency   3. SUI (stress urinary incontinence, female)   4. Levator spasm     Overactive bladder Assessment & Plan: - We discussed the symptoms of overactive bladder (OAB), which include urinary urgency, urinary frequency, nocturia, with or without urge incontinence.  While we do not know the exact etiology of OAB, several treatment options exist. We discussed management including behavioral therapy (decreasing bladder irritants, urge suppression strategies, timed voids, bladder retraining), physical therapy, medication; for refractory cases posterior tibial nerve stimulation, sacral neuromodulation, and intravesical botulinum toxin injection.  - She prefers to try another medication (no improvement with ditropan and myrbetriq) For anticholinergic medications, we discussed the potential side effects of anticholinergics including dry eyes, dry mouth, constipation, cognitive impairment and urinary retention. - She is also interested  in pelvic PT, referral placed - Handouts provided on third line therapies for her to consider - Also advised to eliminate bladder irritants and drink more water   Orders: -     AMB referral to rehabilitation -     Trospium  Chloride; Take 1 tablet (20 mg total) by mouth 2 (two) times daily.  Dispense: 60 tablet; Refill: 5  Urinary frequency -     POCT URINALYSIS DIP (CLINITEK)  SUI (stress urinary incontinence, female) Assessment & Plan: - Symptoms are less than OAB symptoms so will defer treatment at this time. Can reassess after improvement of OAB   Levator spasm Assessment & Plan: - The origin of pelvic floor muscle spasm can be multifactorial, including primary, reactive to a different pain source, trauma, or even part of a centralized pain syndrome.Treatment options include pelvic floor physical therapy, local (vaginal) or oral  muscle relaxants, pelvic muscle trigger point injections or centrally acting pain medications.   - She will start with pelvic PT, referral placed - She also has flexeril that she takes occasionally. Recommended she take a dose if she is feeling more pelvic tension   Return 6 weeks   Rosaline LOISE Caper, MD

## 2024-08-28 NOTE — Assessment & Plan Note (Signed)
-   The origin of pelvic floor muscle spasm can be multifactorial, including primary, reactive to a different pain source, trauma, or even part of a centralized pain syndrome.Treatment options include pelvic floor physical therapy, local (vaginal) or oral  muscle relaxants, pelvic muscle trigger point injections or centrally acting pain medications.   - She will start with pelvic PT, referral placed - She also has flexeril that she takes occasionally. Recommended she take a dose if she is feeling more pelvic tension

## 2024-08-28 NOTE — Assessment & Plan Note (Signed)
-   Symptoms are less than OAB symptoms so will defer treatment at this time. Can reassess after improvement of OAB

## 2024-10-07 ENCOUNTER — Ambulatory Visit: Admitting: Obstetrics and Gynecology

## 2024-12-23 ENCOUNTER — Encounter: Payer: Self-pay | Admitting: *Deleted
